# Patient Record
Sex: Male | Born: 1935 | Race: White | Hispanic: No | Smoking: Never smoker
Health system: Southern US, Community
[De-identification: ages and names within clinical notes are randomized; demographics above are authoritative.]

## PROBLEM LIST (undated history)

## (undated) DIAGNOSIS — M545 Low back pain, unspecified: Secondary | ICD-10-CM

## (undated) DIAGNOSIS — E875 Hyperkalemia: Secondary | ICD-10-CM

## (undated) DIAGNOSIS — F1020 Alcohol dependence, uncomplicated: Secondary | ICD-10-CM

## (undated) DIAGNOSIS — N183 Chronic kidney disease, stage 3 unspecified: Secondary | ICD-10-CM

## (undated) DIAGNOSIS — R569 Unspecified convulsions: Secondary | ICD-10-CM

## (undated) DIAGNOSIS — I10 Essential (primary) hypertension: Secondary | ICD-10-CM

## (undated) DIAGNOSIS — K635 Polyp of colon: Secondary | ICD-10-CM

## (undated) DIAGNOSIS — N400 Enlarged prostate without lower urinary tract symptoms: Secondary | ICD-10-CM

## (undated) DIAGNOSIS — D649 Anemia, unspecified: Secondary | ICD-10-CM

## (undated) DIAGNOSIS — M25559 Pain in unspecified hip: Secondary | ICD-10-CM

## (undated) DIAGNOSIS — E785 Hyperlipidemia, unspecified: Secondary | ICD-10-CM

## (undated) DIAGNOSIS — I2510 Atherosclerotic heart disease of native coronary artery without angina pectoris: Secondary | ICD-10-CM

## (undated) DIAGNOSIS — G8929 Other chronic pain: Secondary | ICD-10-CM

## (undated) DIAGNOSIS — D519 Vitamin B12 deficiency anemia, unspecified: Principal | ICD-10-CM

## (undated) DIAGNOSIS — H43812 Vitreous degeneration, left eye: Secondary | ICD-10-CM

## (undated) DIAGNOSIS — M898X9 Other specified disorders of bone, unspecified site: Principal | ICD-10-CM

## (undated) HISTORY — DX: Chronic kidney disease, stage 3 unspecified: N18.30

## (undated) HISTORY — DX: Vitreous degeneration, left eye: H43.812

## (undated) HISTORY — DX: Pain in unspecified hip: M25.559

## (undated) HISTORY — DX: Anemia, unspecified: D64.9

## (undated) HISTORY — DX: Essential (primary) hypertension: I10

## (undated) HISTORY — DX: Other chronic pain: G89.29

## (undated) HISTORY — DX: Benign prostatic hyperplasia without lower urinary tract symptoms: N40.0

## (undated) HISTORY — DX: Chronic kidney disease, stage 3 (moderate): N18.3

## (undated) HISTORY — DX: Low back pain: M54.5

## (undated) HISTORY — PX: OTHER SURGICAL HISTORY: SHX169

## (undated) HISTORY — DX: Unspecified convulsions: R56.9

## (undated) HISTORY — DX: Low back pain, unspecified: M54.50

## (undated) HISTORY — PX: LUMBAR DISC SURGERY: SHX700

## (undated) HISTORY — DX: Hyperkalemia: E87.5

## (undated) HISTORY — DX: Atherosclerotic heart disease of native coronary artery without angina pectoris: I25.10

## (undated) HISTORY — DX: Alcohol dependence, uncomplicated: F10.20

## (undated) HISTORY — DX: Vitamin B12 deficiency anemia, unspecified: D51.9

## (undated) HISTORY — DX: Other specified disorders of bone, unspecified site: M89.8X9

## (undated) HISTORY — DX: Polyp of colon: K63.5

## (undated) HISTORY — DX: Hyperlipidemia, unspecified: E78.5

---
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
## 1986-09-01 HISTORY — PX: OTHER SURGICAL HISTORY: SHX169

## 1999-09-02 DIAGNOSIS — I2510 Atherosclerotic heart disease of native coronary artery without angina pectoris: Secondary | ICD-10-CM

## 1999-09-02 HISTORY — DX: Atherosclerotic heart disease of native coronary artery without angina pectoris: I25.10

## 2001-10-22 ENCOUNTER — Encounter: Admission: RE | Admit: 2001-10-22 | Discharge: 2002-01-13 | Payer: Self-pay | Admitting: Family Medicine

## 2001-12-30 ENCOUNTER — Encounter: Admission: RE | Admit: 2001-12-30 | Discharge: 2001-12-30 | Payer: Self-pay | Admitting: Family Medicine

## 2001-12-30 ENCOUNTER — Encounter: Payer: Self-pay | Admitting: Family Medicine

## 2002-02-16 ENCOUNTER — Encounter: Payer: Self-pay | Admitting: *Deleted

## 2002-02-16 ENCOUNTER — Encounter: Admission: RE | Admit: 2002-02-16 | Discharge: 2002-02-16 | Payer: Self-pay | Admitting: *Deleted

## 2002-03-02 ENCOUNTER — Encounter: Admission: RE | Admit: 2002-03-02 | Discharge: 2002-03-02 | Payer: Self-pay | Admitting: *Deleted

## 2002-03-02 ENCOUNTER — Encounter: Payer: Self-pay | Admitting: *Deleted

## 2002-03-15 ENCOUNTER — Encounter: Admission: RE | Admit: 2002-03-15 | Discharge: 2002-03-15 | Payer: Self-pay | Admitting: *Deleted

## 2002-03-15 ENCOUNTER — Encounter: Payer: Self-pay | Admitting: *Deleted

## 2004-02-21 ENCOUNTER — Encounter: Admission: RE | Admit: 2004-02-21 | Discharge: 2004-02-21 | Payer: Self-pay | Admitting: Family Medicine

## 2004-09-01 DIAGNOSIS — R569 Unspecified convulsions: Secondary | ICD-10-CM

## 2004-09-01 HISTORY — DX: Unspecified convulsions: R56.9

## 2004-12-26 ENCOUNTER — Emergency Department (HOSPITAL_COMMUNITY): Admission: EM | Admit: 2004-12-26 | Discharge: 2004-12-26 | Payer: Self-pay | Admitting: Family Medicine

## 2004-12-28 ENCOUNTER — Ambulatory Visit (HOSPITAL_COMMUNITY): Admission: RE | Admit: 2004-12-28 | Discharge: 2004-12-28 | Payer: Self-pay | Admitting: Emergency Medicine

## 2005-01-12 ENCOUNTER — Emergency Department (HOSPITAL_COMMUNITY): Admission: EM | Admit: 2005-01-12 | Discharge: 2005-01-12 | Payer: Self-pay | Admitting: Emergency Medicine

## 2005-03-26 ENCOUNTER — Encounter: Payer: Self-pay | Admitting: Internal Medicine

## 2005-03-26 ENCOUNTER — Ambulatory Visit (HOSPITAL_COMMUNITY): Admission: RE | Admit: 2005-03-26 | Discharge: 2005-03-26 | Payer: Self-pay | Admitting: *Deleted

## 2005-03-26 ENCOUNTER — Encounter (INDEPENDENT_AMBULATORY_CARE_PROVIDER_SITE_OTHER): Payer: Self-pay | Admitting: Specialist

## 2005-04-30 ENCOUNTER — Encounter: Admission: RE | Admit: 2005-04-30 | Discharge: 2005-04-30 | Payer: Self-pay | Admitting: Nephrology

## 2005-05-02 ENCOUNTER — Encounter (HOSPITAL_COMMUNITY): Admission: RE | Admit: 2005-05-02 | Discharge: 2005-05-16 | Payer: Self-pay | Admitting: Nephrology

## 2005-05-30 ENCOUNTER — Encounter (HOSPITAL_COMMUNITY): Admission: RE | Admit: 2005-05-30 | Discharge: 2005-08-28 | Payer: Self-pay | Admitting: Nephrology

## 2005-07-29 ENCOUNTER — Inpatient Hospital Stay (HOSPITAL_COMMUNITY): Admission: EM | Admit: 2005-07-29 | Discharge: 2005-07-31 | Payer: Self-pay | Admitting: Emergency Medicine

## 2005-07-29 ENCOUNTER — Ambulatory Visit: Payer: Self-pay | Admitting: Internal Medicine

## 2005-08-06 ENCOUNTER — Ambulatory Visit (HOSPITAL_COMMUNITY): Admission: RE | Admit: 2005-08-06 | Discharge: 2005-08-06 | Payer: Self-pay | Admitting: Otolaryngology

## 2005-08-11 ENCOUNTER — Ambulatory Visit: Payer: Self-pay | Admitting: Internal Medicine

## 2005-08-19 ENCOUNTER — Ambulatory Visit: Payer: Self-pay | Admitting: Internal Medicine

## 2005-09-05 ENCOUNTER — Encounter (HOSPITAL_COMMUNITY): Admission: RE | Admit: 2005-09-05 | Discharge: 2005-12-04 | Payer: Self-pay | Admitting: Nephrology

## 2005-09-12 ENCOUNTER — Ambulatory Visit: Payer: Self-pay | Admitting: Hospitalist

## 2005-10-23 ENCOUNTER — Ambulatory Visit (HOSPITAL_COMMUNITY): Admission: RE | Admit: 2005-10-23 | Discharge: 2005-10-23 | Payer: Self-pay | Admitting: Neurology

## 2005-12-15 ENCOUNTER — Encounter (HOSPITAL_COMMUNITY): Admission: RE | Admit: 2005-12-15 | Discharge: 2006-03-15 | Payer: Self-pay | Admitting: Nephrology

## 2006-04-09 ENCOUNTER — Encounter (HOSPITAL_COMMUNITY): Admission: RE | Admit: 2006-04-09 | Discharge: 2006-07-08 | Payer: Self-pay | Admitting: Nephrology

## 2006-08-28 ENCOUNTER — Encounter (HOSPITAL_COMMUNITY): Admission: RE | Admit: 2006-08-28 | Discharge: 2006-11-26 | Payer: Self-pay | Admitting: Nephrology

## 2006-12-05 ENCOUNTER — Emergency Department (HOSPITAL_COMMUNITY): Admission: EM | Admit: 2006-12-05 | Discharge: 2006-12-05 | Payer: Self-pay | Admitting: Emergency Medicine

## 2006-12-26 ENCOUNTER — Emergency Department (HOSPITAL_COMMUNITY): Admission: EM | Admit: 2006-12-26 | Discharge: 2006-12-26 | Payer: Self-pay | Admitting: Emergency Medicine

## 2007-01-07 ENCOUNTER — Encounter (HOSPITAL_COMMUNITY): Admission: RE | Admit: 2007-01-07 | Discharge: 2007-04-07 | Payer: Self-pay | Admitting: Nephrology

## 2007-05-18 ENCOUNTER — Encounter (HOSPITAL_COMMUNITY): Admission: RE | Admit: 2007-05-18 | Discharge: 2007-08-16 | Payer: Self-pay | Admitting: Nephrology

## 2007-09-20 ENCOUNTER — Encounter (HOSPITAL_COMMUNITY): Admission: RE | Admit: 2007-09-20 | Discharge: 2007-12-19 | Payer: Self-pay | Admitting: Nephrology

## 2007-11-27 ENCOUNTER — Encounter: Payer: Self-pay | Admitting: Internal Medicine

## 2008-01-26 ENCOUNTER — Encounter (HOSPITAL_COMMUNITY): Admission: RE | Admit: 2008-01-26 | Discharge: 2008-04-25 | Payer: Self-pay | Admitting: Nephrology

## 2008-03-22 ENCOUNTER — Emergency Department (HOSPITAL_COMMUNITY): Admission: EM | Admit: 2008-03-22 | Discharge: 2008-03-22 | Payer: Self-pay | Admitting: Family Medicine

## 2008-05-30 ENCOUNTER — Encounter (HOSPITAL_COMMUNITY): Admission: RE | Admit: 2008-05-30 | Discharge: 2008-08-28 | Payer: Self-pay | Admitting: Nephrology

## 2008-06-05 ENCOUNTER — Encounter: Payer: Self-pay | Admitting: Internal Medicine

## 2008-07-13 ENCOUNTER — Emergency Department (HOSPITAL_COMMUNITY): Admission: EM | Admit: 2008-07-13 | Discharge: 2008-07-13 | Payer: Self-pay | Admitting: Emergency Medicine

## 2008-09-05 ENCOUNTER — Encounter: Payer: Self-pay | Admitting: Family Medicine

## 2008-09-06 ENCOUNTER — Encounter (HOSPITAL_COMMUNITY): Admission: RE | Admit: 2008-09-06 | Discharge: 2008-12-05 | Payer: Self-pay | Admitting: Nephrology

## 2009-01-03 ENCOUNTER — Encounter (HOSPITAL_COMMUNITY): Admission: RE | Admit: 2009-01-03 | Discharge: 2009-04-03 | Payer: Self-pay | Admitting: Nephrology

## 2009-04-03 ENCOUNTER — Encounter (HOSPITAL_COMMUNITY): Admission: RE | Admit: 2009-04-03 | Discharge: 2009-07-02 | Payer: Self-pay | Admitting: Nephrology

## 2009-06-24 ENCOUNTER — Emergency Department (HOSPITAL_COMMUNITY): Admission: EM | Admit: 2009-06-24 | Discharge: 2009-06-24 | Payer: Self-pay | Admitting: Emergency Medicine

## 2009-07-24 ENCOUNTER — Encounter (HOSPITAL_COMMUNITY): Admission: RE | Admit: 2009-07-24 | Discharge: 2009-10-22 | Payer: Self-pay | Admitting: Nephrology

## 2009-11-13 ENCOUNTER — Encounter (HOSPITAL_COMMUNITY): Admission: RE | Admit: 2009-11-13 | Discharge: 2010-02-11 | Payer: Self-pay | Admitting: Nephrology

## 2010-03-05 ENCOUNTER — Encounter (INDEPENDENT_AMBULATORY_CARE_PROVIDER_SITE_OTHER): Payer: Self-pay | Admitting: *Deleted

## 2010-03-05 ENCOUNTER — Encounter (HOSPITAL_COMMUNITY): Admission: RE | Admit: 2010-03-05 | Discharge: 2010-06-03 | Payer: Self-pay | Admitting: Nephrology

## 2010-05-15 ENCOUNTER — Encounter (INDEPENDENT_AMBULATORY_CARE_PROVIDER_SITE_OTHER): Payer: Self-pay | Admitting: *Deleted

## 2010-05-15 ENCOUNTER — Ambulatory Visit: Payer: Self-pay | Admitting: Internal Medicine

## 2010-05-15 DIAGNOSIS — Z860100 Personal history of colon polyps, unspecified: Secondary | ICD-10-CM | POA: Insufficient documentation

## 2010-05-15 DIAGNOSIS — I739 Peripheral vascular disease, unspecified: Secondary | ICD-10-CM | POA: Insufficient documentation

## 2010-05-20 ENCOUNTER — Encounter: Payer: Self-pay | Admitting: Internal Medicine

## 2010-05-21 ENCOUNTER — Encounter (INDEPENDENT_AMBULATORY_CARE_PROVIDER_SITE_OTHER): Payer: Self-pay | Admitting: *Deleted

## 2010-06-11 ENCOUNTER — Encounter (HOSPITAL_COMMUNITY)
Admission: RE | Admit: 2010-06-11 | Discharge: 2010-09-09 | Payer: Self-pay | Source: Home / Self Care | Attending: Nephrology | Admitting: Nephrology

## 2010-08-09 ENCOUNTER — Emergency Department (HOSPITAL_COMMUNITY)
Admission: EM | Admit: 2010-08-09 | Discharge: 2010-08-09 | Payer: Self-pay | Source: Home / Self Care | Admitting: Emergency Medicine

## 2010-08-12 ENCOUNTER — Emergency Department (HOSPITAL_COMMUNITY)
Admission: EM | Admit: 2010-08-12 | Discharge: 2010-08-12 | Payer: Self-pay | Source: Home / Self Care | Admitting: Emergency Medicine

## 2010-09-04 ENCOUNTER — Emergency Department (HOSPITAL_COMMUNITY)
Admission: EM | Admit: 2010-09-04 | Discharge: 2010-09-04 | Payer: Self-pay | Source: Home / Self Care | Admitting: Emergency Medicine

## 2010-10-01 NOTE — Letter (Signed)
Summary: [facility] Instructions  Larry West  [address] [place], [place] [address]   Phone: [phone]  Fax: [phone]       Larry West    1925/10/20    MRN: [id number]      Procedure Day Dorna Bloom: Jake Shark, 07/02/10     Arrival Time: 10:30 AM      Procedure Time: 11:30 AM    Location of Procedure:                    _X_  [facility] (4th Floor)                     PREPARATION FOR COLONOSCOPY WITH MOVIPREP   Starting 5 days prior to your procedure 06/27/10 do not eat nuts, seeds, popcorn, corn, beans, peas,  salads, or any raw vegetables.  Do not take any fiber supplements (e.g. Metamucil, Citrucel, and Benefiber).  THE DAY BEFORE YOUR PROCEDURE         MONDAY, 07/01/10  1.  Drink clear liquids the entire day-NO SOLID FOOD  2.  Do not drink anything colored red or purple.  Avoid juices with pulp.  No orange juice.  3.  Drink at least 64 oz. (8 glasses) of fluid/clear liquids during the day to prevent dehydration and help the prep work efficiently.  CLEAR LIQUIDS INCLUDE: Water Jello Ice Popsicles Tea (sugar ok, no milk/cream) Powdered fruit flavored drinks Coffee (sugar ok, no milk/cream) Gatorade Juice: apple, white grape, white cranberry  Lemonade Clear bullion, consomm, broth Carbonated beverages (any kind) Strained chicken noodle soup Hard Candy                          4.  In the morning, mix first dose of MoviPrep solution:    Empty 1 Pouch A and 1 Pouch B into the disposable container    Add lukewarm drinking water to the top line of the container. Mix to dissolve    Refrigerate (mixed solution should be used within 24 hrs)  5.  Begin drinking the prep at 5:00 p.m. The MoviPrep container is divided by 4 marks.   Every 15 minutes drink the solution down to the next mark (approximately 8 oz) until the full liter is complete.   6.  Follow completed prep with 16 oz of clear liquid of your choice (Nothing red or purple).   Continue to drink clear liquids until bedtime.  7.  Before going to bed, mix second dose of MoviPrep solution:    Empty 1 Pouch A and 1 Pouch B into the disposable container    Add lukewarm drinking water to the top line of the container. Mix to dissolve    Refrigerate  THE DAY OF YOUR PROCEDURE      TUESDAY, 07/02/10  Beginning at 6:30 AM (5 hours before procedure):         1. Every 15 minutes, drink the solution down to the next mark (approx 8 oz) until the full liter is complete.  2. Follow completed prep with 16 oz. of clear liquid of your choice.    3. You may drink clear liquids until 9:30 AM (2 HOURS BEFORE PROCEDURE).  MEDICATION INSTRUCTIONS  Unless otherwise instructed, you should take regular prescription medications with a small sip of water   as early as possible the morning of your procedure.  Stop taking Plavix or Aggrenox on  _  _  (7  days before procedure).  You will be contacted by our office prior to your procedure for directions on holding your Plavix.  If you do not hear from our office 1 week prior to your scheduled procedure, please call [phone] to discuss.         OTHER INSTRUCTIONS  You will need a responsible adult at least 75 years of age to accompany you and drive you home.   This person must remain in the waiting room during your procedure.  Wear loose fitting clothing that is easily removed.  Leave jewelry and other valuables at home.  However, you may wish to bring a book to read or  an iPod/MP3 player to listen to music as you wait for your procedure to start.  Remove all body piercing jewelry and leave at home.  Total time from sign-in until discharge is approximately 2-3 hours.  You should go home directly after your procedure and rest.  You can resume normal activities the  day after your procedure.  The day of your procedure you should not:   Drive   Make legal decisions   Operate machinery   Drink alcohol   Return to  work  You will receive specific instructions about eating, activities and medications before you leave.  The above instructions have been reviewed and explained to me by   _______________________  I fully understand and can verbalize these instructions _____________________________ Date _________

## 2010-10-01 NOTE — Procedures (Signed)
Summary: EGD: Barrett's, Hiatal Hernia: Dr. Luther Parody   EGD  Procedure date:  03/26/2005  Findings:      Findings: Barrett's Esophagus Findings: Hiatal Hernia Performed by Dr. Luther Parody  ***MICROSCOPIC EXAMINATION AND DIAGNOSIS***    1. SMALL BOWEL BIOPSY: BENIGN SMALL BOWEL MUCOSA. NO VILLOUS ATROPHY, INFLAMMATION OR OTHER ABNORMALITIES PRESENT.    2. ESOPHAGUS, BIOPSIES: FRAGMENTS OF BENIGN GASTRIC TYPE MUCOSA WITH CHRONIC INFLAMMATION. NO INTESTINAL METAPLASIA OR DYSPLASIA IDENTIFIED.  Patient Name: Larry West, Larry West MRN: [id number] Procedure Procedures: Panendoscopy (EGD) CPT: 43235.    with biopsy(s)/brushing(s). CPT: D1846139.  Personnel: Endoscopist: Roosvelt Harps, MD.  Referred By: Rudi Heap, MD.  Exam Location: Exam performed in Endoscopy Suite. Outpatient  Patient Consent: Procedure, Alternatives, Risks and Benefits discussed, consent obtained, from patient. Consent was obtained by the RN. Consent to be contacted was not given.  Indications  Evaluation of: Anemia,  with low ferritin. with low iron saturation.  Symptoms: Reflux symptoms  History  Pre-Exam Physical: Performed Mar 17, 2002  Cardio-pulmonary exam, HEENT exam, Abdominal exam, Extremity exam, Neurological exam, Mental status exam WNL.  Exam Exam Info: Maximum depth of insertion Duodenum, intended Duodenum. Patient position: on left side. Vocal cords not visualized. Gastric retroflexion performed. Images taken. ASA Classification: II. Tolerance: excellent.  Sedation Meds: Patient assessed and found to be appropriate for moderate (conscious) sedation. Sedation was managed by the Endoscopist. Cetacaine Spray 2 sprays given aerosolized. Demerol 40 mg. given IV. Versed 4 mg. given IV.  Monitoring: BP and pulse monitoring done. Oximetry used. Supplemental O2 given  Fluoroscopy: Fluoroscopy was not used.  Findings BARRETT'S ESOPHAGUS:  suspected. Proximal margin 32 cm from mouth,  Z Line 34 cm  from mouth, Length of Barrett's 2 cm. No inflammation present. Biopsy/Barrett's taken. ICD9: Barrett's: 530.2. Comment: Image # 4.  - HIATAL HERNIA: Diaphragm 40 cm from mouth. Z-line/GE Junction 34 cm from mouth. Irregular, Biopsy/Hiatal Hernia taken.  ICD9: Hernia, Hiatal: 553.3.Comments: Image #3.  - IMAGE TAKEN: in Antrum. Image #2 attached. Comments: Probable Billroth 1.  IMAGE TAKEN: in Duodenal 2nd Portion. Image #1 attached. Comments: Normal.   Assessment Abnormal examination, see findings above.  Diagnoses: 553.3: Hernia, Hiatal.  530.2: Barrett's.   Events  Unplanned Intervention: No unplanned interventions were required.  Unplanned Events: There were no complications. Plans Medication(s): Continue current medications.  Patient Education: Patient given standard instructions for: Hiatal Hernia. Reflux.  Disposition: After procedure patient sent to recovery. After recovery patient sent home.  Scheduling: Await pathology to schedule patient. Follow-up prn.   CC:   Rudi Heap, MD  This report was created from the original endoscopy report, which was reviewed and signed by the above listed endoscopist.

## 2010-10-01 NOTE — Letter (Signed)
Summary: Anticoagulation Modification Letter  [facility]  [address] [place], [place] [address]   Phone: [phone]  Fax: [phone]    May 15, 2010  Re:    Larry West DOB:    17-Jul-1926 MRN:  [id number]    Dear Dr. Tanya Nones:  We have scheduled the above patient for an endoscopic procedure with Dr. Leone Payor. Our records show that he is on anticoagulation therapy. Please advise as to how long the patient may come off their therapy of Plavix prior to the scheduled procedure on July 02, 2010.  Please fax the completed form to Francee Piccolo, CMA (AAMA)  at [phone].  Thank you for your help with this matter.  Sincerely,  Francee Piccolo CMA Duncan Dull)   Physician Recommendation:  Hold Plavix 7 days prior ________________  Other ______________________________     Appended Document: Anticoagulation Modification Letter Note received from Dr.Pickard OK'ing stopping Plavix prior to procedure.  Pt notified via phone, but I will also mail him a letter.

## 2010-10-01 NOTE — Procedures (Signed)
Summary: Colonoscopy: Adenoma x 2: Dr. Luther Parody   Colonoscopy  Procedure date:  03/26/2005  Findings:      Pathology:  Adenomatous polyp. Location:  [facility].   ***MICROSCOPIC EXAMINATION AND DIAGNOSIS***    1. SMALL BOWEL BIOPSY: BENIGN SMALL BOWEL MUCOSA. NO VILLOUS ATROPHY, INFLAMMATION OR OTHER ABNORMALITIES PRESENT.    2. ESOPHAGUS, BIOPSIES: FRAGMENTS OF BENIGN GASTRIC TYPE MUCOSA WITH CHRONIC INFLAMMATION. NO INTESTINAL METAPLASIA OR DYSPLASIA IDENTIFIED.    3. COLON, HEPATIC FLEXURE, POLYPS: ONE ADENOMATOUS POLYP AND POLYPOID FRAGMENTS OF BENIGN COLONIC MUCOSA. NO HIGH GRADE DYSPLASIA OR MALIGNANCY IDENTIFIED.    4. COLON, 30 CM, BIOPSIES: ONE ADENOMATOUS POLYP AND ONE FRAGMENT OF BENIGN COLONIC MUCOSA. NO HIGH GRADE DYSPLASIA OR MALIGNANCY IDENTIFIED.  Comments:      Repeat colonoscopy in 5 years.   Procedures Next Due Date:    Colonoscopy: 04/2010  Patient Name: Larry West, Larry West MRN: [id number] Procedure Procedures: Colonoscopy CPT: 19147.    with biopsy. CPT: Q5068410.  Personnel: Endoscopist: Roosvelt Harps, MD.  Referred By: Rudi Heap, MD.  Exam Location: Exam performed in Endoscopy Suite. Outpatient  Patient Consent: Procedure, Alternatives, Risks and Benefits discussed, consent obtained, from patient. Consent was obtained by the RN. Consent to be contacted was not given.  Indications  Evaluation of: Anemia with low ferritin. with low iron saturation.  History  Current Medications: Patient is not currently taking Coumadin.  Allergies: Allergic to PCN.  Patient Habits Patient does not smoke. Drinking Status: not currently drinking.  Pre-Exam Physical: Performed Mar 17, 2002. Cardio-pulmonary exam, Rectal exam, HEENT exam , Abdominal exam, Extremity exam, Neurological exam, Mental status exam WNL.  Exam Exam: Extent of exam reached: Cecum, extent intended: Cecum.  The cecum was identified by appendiceal orifice and IC valve.  Patient position: on left side. Colon retroflexion performed. Images taken. ASA Classification: II. Tolerance: excellent.  Monitoring: Pulse and BP monitoring, Oximetry used. Supplemental O2 given.  Colon Prep Used MIRALAX for colon prep. Prep results: good.  Fluoroscopy: Fluoroscopy was not used.  Sedation Meds: Patient assessed and found to be appropriate for moderate (conscious) sedation. Sedation was managed by the Endoscopist. Residual sedation present from prior procedure today.  Demerol 20 mg. given IV. Versed 1 mg. given IV.  Findings - MULTIPLE POLYPS: Hepatic Flexure. minimum size 4 mm, maximum size 4 mm. Procedure:  biopsy without cautery, removed, Polyp retrieved, 2 polyps Polyps sent to pathology. ICD9: Colon Polyps: 211.3. Comments: Image # 3.  IMAGE TAKEN: Ascending Colon.  Image #2 attached.  Comments:  Normal.  IMAGE TAKEN: Cecum.  Image #1 attached.  Comments:  Normal.  POLYP: Sigmoid Colon, diminutive, Distance from Anus 30 cm. Procedure:  biopsy without cautery, removed, retrieved, Polyp sent to pathology.  IMAGE TAKEN: Rectum.  Image #3 attached.  Comments:  Normal.   Assessment Abnormal examination, see findings above.  Diagnoses: 211.3: Colon Polyps.   Events  Unplanned Interventions: No intervention was required.  Unplanned Events: There were no complications. Plans  Post Exam Instructions: Post sedation instructions given.  Medication Plan: Continue current medications.  Patient Education: Patient given standard instructions for: Polyps.  Disposition: After procedure patient sent to recovery. After recovery patient sent home.  Scheduling/Referral: Follow-Up prn. Await pathology to schedule patient.

## 2010-10-01 NOTE — Letter (Signed)
Summary: Generic Letter  [facility]  [address] [place], [place] [address]   Phone: [phone]  Fax: [phone]    05/21/2010  Larry West [address] [place], [place]  [address]  Dear Mr. Baysinger,  As we discussed on the phone earlier today, it is OK for you to hold your Plavix prior to your colonoscopy on November 1st.  You will take your regular dose on June 25, 2010.  Do not take any Plavix from June 26, 2010 through your procedure date.  Dr. Leone Payor will give your instructions on restarting your Plavix at your procedure.  If you have any questions please call our office and ask to speak with [facility].  Sincerely,   Francee Piccolo CMA (AAMA)

## 2010-10-01 NOTE — Letter (Signed)
Summary: Colonoscopy-Changed to Office Visit Letter  [facility]  [address] [place], [place] [address]   Phone: [phone]  Fax: [phone]      March 05, 2010 MRN: [id number]   Larry West [address] [place], [place]  [address]   Dear [name],   According to our records, it is time for you to schedule a Colonoscopy. However, after reviewing your medical record, I feel that an office visit would be most appropriate to more completely evaluate you and determine your need for a repeat procedure.  Please call [phone] (option #2) at your convenience to schedule an office visit. If you have any questions, concerns, or feel that this letter is in error, we would appreciate your call.   Sincerely,   Iva Boop, M.D.  [facility] Gastroenterology Division [phone]

## 2010-10-01 NOTE — Assessment & Plan Note (Signed)
Summary: f/u--ch.   History of Present Illness Visit Type: Initial Visit Primary GI MD: Stan Head MD [facility] Primary Provider: Rudi Heap, MD Chief Complaint: Colon recall History of Present Illness:   77 yo wm with prior polyps  Son recently died from colon cancer at age 50.   GI Review of Systems    Reports acid reflux and  weight loss.      Denies abdominal pain, belching, bloating, chest pain, dysphagia with liquids, dysphagia with solids, heartburn, loss of appetite, nausea, vomiting, vomiting blood, and  weight gain.        Denies anal fissure, black tarry stools, change in bowel habit, constipation, diarrhea, diverticulosis, fecal incontinence, heme positive stool, hemorrhoids, irritable bowel syndrome, jaundice, light color stool, liver problems, rectal bleeding, and  rectal pain. Preventive Screening-Counseling & Management  Alcohol-Tobacco     Smoking Status: never      Drug Use:  no.      Clinical Reports Reviewed:  Colonoscopy:  03/26/2005:  Pathology:  Adenomatous polyp. Location:  [facility].   ***MICROSCOPIC EXAMINATION AND DIAGNOSIS***    1. SMALL BOWEL BIOPSY: BENIGN SMALL BOWEL MUCOSA. NO VILLOUS ATROPHY, INFLAMMATION OR OTHER ABNORMALITIES PRESENT.    2. ESOPHAGUS, BIOPSIES: FRAGMENTS OF BENIGN GASTRIC TYPE MUCOSA WITH CHRONIC INFLAMMATION. NO INTESTINAL METAPLASIA OR DYSPLASIA IDENTIFIED.    3. COLON, HEPATIC FLEXURE, POLYPS: ONE ADENOMATOUS POLYP AND POLYPOID FRAGMENTS OF BENIGN COLONIC MUCOSA. NO HIGH GRADE DYSPLASIA OR MALIGNANCY IDENTIFIED.    4. COLON, 30 CM, BIOPSIES: ONE ADENOMATOUS POLYP AND ONE FRAGMENT OF BENIGN COLONIC MUCOSA. NO HIGH GRADE DYSPLASIA OR MALIGNANCY IDENTIFIED.   Current Medications (verified): 1)  Amlodipine Besylate 10 Mg Tabs (Amlodipine Besylate) .... Once Daily 2)  Benazepril Hcl 20 Mg Tabs (Benazepril Hcl) .... Once Daily 3)  Calcitriol 0.25 Mcg Caps (Calcitriol) .... Once Daily 4)  Furosemide 20 Mg Tabs  (Furosemide) .... Once Daily 5)  Hydrocodone-Acetaminophen 7.5-500 Mg Tabs (Hydrocodone-Acetaminophen) .... As Needed 6)  Lipitor 40 Mg Tabs (Atorvastatin Calcium) .... Once Daily 7)  Metoprolol Tartrate 25 Mg Tabs (Metoprolol Tartrate) .... Once Daily 8)  Nevanac 0.1 % Susp (Nepafenac) .... As Needed 9)  Plavix 75 Mg Tabs (Clopidogrel Bisulfate) .... Once Daily 10)  Phenytoin Sodium Extended 100 Mg Caps (Phenytoin Sodium Extended) .... Once Daily  Allergies: 1)  ! Sulfa 2)  Pcn  Past History:  Past Medical History: Spinal Stenosis w/DJD Peripheral Vascular Disease Chronic Kidney Disease Adenomatous Colon Polyps Hypertension Myocardial Infarction Seizures  Past Surgical History: Reviewed history from 05/15/2010 and no changes required. Stomach Surgery Appendectomy Back Surgery '96 PTCA-Stent  Family History: No FH of Colon Cancer:  Social History: Occupation: Retired Patient has never smoked.  Alcohol Use - yes Illicit Drug Use - no Smoking Status:  never Drug Use:  no  Review of Systems       The patient complains of back pain and change in vision.         All other ROS negative except as per HPI. cataract removed and vision imroved  Vital Signs:  Patient profile:   77 year old male Height:      59 inches Weight:      174.13 pounds BMI:     35.30 Pulse rate:   60 / minute Pulse rhythm:   regular BP sitting:   168 / 86  (left arm) Cuff size:   regular  Vitals Entered By: June McMurray CMA Duncan Dull) (May 15, 2010 2:03 PM)  Physical Exam  General:  elderly NAD Eyes:  no icterus. Mouth:  poor dentition Lungs:  diffusely decreased breath sounds Heart:  split S2 otherwise normal without rubs, gallops murmurs Abdomen:  Soft, nontender and nondistended. No masses, hepatosplenomegaly or hernias noted. Normal bowel sounds. Neurologic:  Alert and  oriented x3   Impression & Recommendations:  Problem # 1:  PERSONAL HX COLONIC POLYPS  (ICD-V12.72) Assessment Unchanged 5 years since adenomas reomved so surveillance and screening exam appropriate. Orders: Colonoscopy (Colon) Risks, benefits,and indications of endoscopic procedure(s) were reviewed with the patient and all questions answered.  Problem # 2:  SCREENING COLORECTAL-CANCER (ICD-V76.51) Assessment: Unchanged  Orders: Colonoscopy (Colon)  Problem # 3:  FAMILY HX COLON CANCER (ICD-V16.0) Assessment: New son at 61  Problem # 4:  PERIPHERAL VASCULAR DISEASE (ICD-443.9) Assessment: New takes Plavix for this will ask Dr. Tanya Nones if ok to hold patient understands problems with PVD possible off Plavix  Patient Instructions: 1)  Please pick up your medications at your pharmacy. MOVIPREP 2)  We will see you at your procedure on 07/02/10. 3)  We will contact Dr. Tanya Nones regarding your Plavix.  You will be contacted by our office prior to your procedure for directions on holding your Plavix.  If you do not hear from our office 1 week prior to your scheduled procedure, please call [phone] to discuss.  4)  Copy sent to : Rudi Heap, MD [facility]) 5)  [facility] Patient Information Guide given to patient.  6)  Colonoscopy and Flexible Sigmoidoscopy brochure given.  7)  The medication list was reviewed and reconciled.  All changed / newly prescribed medications were explained.  A complete medication list was provided to the patient / caregiver. Prescriptions: MOVIPREP 100 GM  SOLR (PEG-KCL-NACL-NASULF-NA ASC-C) As per prep instructions.  #1 x 0   Entered by:   Francee Piccolo CMA (AAMA)   Authorized by:   Iva Boop MD, [facility]   Signed by:   Francee Piccolo CMA (AAMA) on 05/15/2010   Method used:   Electronically to        [facility] [phone]* (retail)       [address]       [place], [place]  [address]       Ph: [phone]       Fax: [phone]   RxID:   [id number]

## 2010-10-01 NOTE — Consult Note (Signed)
Summary: [place] kidney:Dr. West [facility] kidney:Dr. Coladonato   Imported By: Florinda Marker 01/03/2008 14:33:21  _____________________________________________________________________  External Attachment:    Type:   Image     Comment:   External Document

## 2010-10-01 NOTE — Letter (Signed)
Summary: Anticoag Clearance  Anticoag Clearance   Imported By: Francee Piccolo CMA (AAMA) 05/22/2010 15:42:43  _____________________________________________________________________  External Attachment:    Type:   Image     Comment:   External Document

## 2010-10-02 ENCOUNTER — Encounter (HOSPITAL_COMMUNITY)
Admission: RE | Admit: 2010-10-02 | Discharge: 2010-10-11 | Disposition: A | Discharge status: Home / Self Care | Payer: Medicare FFS | Source: Ambulatory Visit | Attending: Nephrology | Admitting: Nephrology

## 2010-10-02 DIAGNOSIS — N289 Disorder of kidney and ureter, unspecified: Secondary | ICD-10-CM | POA: Insufficient documentation

## 2010-10-15 ENCOUNTER — Encounter (HOSPITAL_COMMUNITY): Payer: Medicare FFS

## 2010-10-15 ENCOUNTER — Other Ambulatory Visit: Payer: Self-pay

## 2010-10-29 ENCOUNTER — Encounter (HOSPITAL_COMMUNITY): Payer: Medicare FFS

## 2010-10-29 ENCOUNTER — Other Ambulatory Visit: Payer: Self-pay

## 2010-11-11 LAB — POCT HEMOGLOBIN-HEMACUE: Hemoglobin: 10.7 g/dL — ABNORMAL LOW (ref 13.0–17.0)

## 2010-11-12 ENCOUNTER — Encounter (HOSPITAL_COMMUNITY): Payer: Medicare FFS | Attending: Nephrology

## 2010-11-12 ENCOUNTER — Other Ambulatory Visit: Payer: Self-pay | Admitting: Nephrology

## 2010-11-12 ENCOUNTER — Other Ambulatory Visit: Payer: Self-pay

## 2010-11-12 DIAGNOSIS — N183 Chronic kidney disease, stage 3 unspecified: Secondary | ICD-10-CM | POA: Insufficient documentation

## 2010-11-12 DIAGNOSIS — D638 Anemia in other chronic diseases classified elsewhere: Secondary | ICD-10-CM | POA: Insufficient documentation

## 2010-11-12 LAB — POCT HEMOGLOBIN-HEMACUE
Hemoglobin: 10.3 g/dL — ABNORMAL LOW (ref 13.0–17.0)
Hemoglobin: 9.6 g/dL — ABNORMAL LOW (ref 13.0–17.0)

## 2010-11-12 LAB — IRON AND TIBC
Saturation Ratios: 32 % (ref 20–55)
TIBC: 280 ug/dL (ref 215–435)
UIBC: 190 ug/dL

## 2010-11-12 LAB — RENAL FUNCTION PANEL
Albumin: 3 g/dL — ABNORMAL LOW (ref 3.5–5.2)
BUN: 26 mg/dL — ABNORMAL HIGH (ref 6–23)
CO2: 19 mEq/L (ref 19–32)
Chloride: 109 mEq/L (ref 96–112)
Creatinine, Ser: 1.71 mg/dL — ABNORMAL HIGH (ref 0.4–1.5)
Potassium: 4.4 mEq/L (ref 3.5–5.1)

## 2010-11-12 LAB — FERRITIN: Ferritin: 115 ng/mL (ref 22–322)

## 2010-11-13 LAB — URINALYSIS, ROUTINE W REFLEX MICROSCOPIC
Bilirubin Urine: NEGATIVE
Glucose, UA: NEGATIVE mg/dL
Protein, ur: 100 mg/dL — AB
Urobilinogen, UA: 0.2 mg/dL (ref 0.0–1.0)

## 2010-11-13 LAB — URINE MICROSCOPIC-ADD ON

## 2010-11-13 LAB — PTH, INTACT AND CALCIUM
Calcium, Total (PTH): 9.2 mg/dL (ref 8.4–10.5)
PTH: 12.6 pg/mL — ABNORMAL LOW (ref 14.0–72.0)

## 2010-11-13 LAB — RENAL FUNCTION PANEL
Albumin: 3.3 g/dL — ABNORMAL LOW (ref 3.5–5.2)
BUN: 36 mg/dL — ABNORMAL HIGH (ref 6–23)
Creatinine, Ser: 1.74 mg/dL — ABNORMAL HIGH (ref 0.4–1.5)
Glucose, Bld: 100 mg/dL — ABNORMAL HIGH (ref 70–99)
Phosphorus: 3.8 mg/dL (ref 2.3–4.6)

## 2010-11-13 LAB — POCT HEMOGLOBIN-HEMACUE: Hemoglobin: 10.2 g/dL — ABNORMAL LOW (ref 13.0–17.0)

## 2010-11-14 LAB — IRON AND TIBC
Iron: 215 ug/dL — ABNORMAL HIGH (ref 42–135)
Saturation Ratios: 77 % — ABNORMAL HIGH (ref 20–55)
UIBC: 65 ug/dL

## 2010-11-14 LAB — POCT HEMOGLOBIN-HEMACUE: Hemoglobin: 9.8 g/dL — ABNORMAL LOW (ref 13.0–17.0)

## 2010-11-15 LAB — IRON AND TIBC
Iron: 121 ug/dL (ref 42–135)
Saturation Ratios: 46 % (ref 20–55)
TIBC: 265 ug/dL (ref 215–435)

## 2010-11-15 LAB — POCT HEMOGLOBIN-HEMACUE: Hemoglobin: 8.7 g/dL — ABNORMAL LOW (ref 13.0–17.0)

## 2010-11-17 LAB — POCT HEMOGLOBIN-HEMACUE: Hemoglobin: 11 g/dL — ABNORMAL LOW (ref 13.0–17.0)

## 2010-11-17 LAB — FERRITIN: Ferritin: 191 ng/mL (ref 22–322)

## 2010-11-17 LAB — IRON AND TIBC
Saturation Ratios: 61 % — ABNORMAL HIGH (ref 20–55)
TIBC: 269 ug/dL (ref 215–435)

## 2010-11-18 LAB — IRON AND TIBC: Iron: 159 ug/dL — ABNORMAL HIGH (ref 42–135)

## 2010-11-19 LAB — POCT HEMOGLOBIN-HEMACUE: Hemoglobin: 10.7 g/dL — ABNORMAL LOW (ref 13.0–17.0)

## 2010-11-19 LAB — IRON AND TIBC
Iron: 96 ug/dL (ref 42–135)
Saturation Ratios: 37 % (ref 20–55)
TIBC: 263 ug/dL (ref 215–435)

## 2010-11-20 LAB — POCT HEMOGLOBIN-HEMACUE: Hemoglobin: 9.9 g/dL — ABNORMAL LOW (ref 13.0–17.0)

## 2010-11-20 LAB — IRON AND TIBC
Iron: 79 ug/dL (ref 42–135)
Iron: 128 ug/dL (ref 42–135)
Saturation Ratios: 43 % (ref 20–55)
TIBC: 301 ug/dL (ref 215–435)

## 2010-11-20 LAB — FERRITIN: Ferritin: 116 ng/mL (ref 22–322)

## 2010-11-24 LAB — POCT HEMOGLOBIN-HEMACUE: Hemoglobin: 10.2 g/dL — ABNORMAL LOW (ref 13.0–17.0)

## 2010-11-24 LAB — IRON AND TIBC: UIBC: 198 ug/dL

## 2010-11-24 LAB — FERRITIN: Ferritin: 111 ng/mL (ref 22–322)

## 2010-11-26 ENCOUNTER — Encounter (HOSPITAL_COMMUNITY): Payer: Medicare Other

## 2010-11-26 ENCOUNTER — Encounter (HOSPITAL_BASED_OUTPATIENT_CLINIC_OR_DEPARTMENT_OTHER)
Admission: RE | Admit: 2010-11-26 | Discharge: 2010-11-26 | Disposition: A | Discharge status: Home / Self Care | Payer: Medicare FFS | Source: Ambulatory Visit | Attending: Orthopedic Surgery | Admitting: Orthopedic Surgery

## 2010-11-26 LAB — BASIC METABOLIC PANEL
Calcium: 8.3 mg/dL — ABNORMAL LOW (ref 8.4–10.5)
Creatinine, Ser: 1.86 mg/dL — ABNORMAL HIGH (ref 0.4–1.5)
GFR calc Af Amer: 42 mL/min — ABNORMAL LOW (ref >60)
GFR calc non Af Amer: 35 mL/min — ABNORMAL LOW (ref >60)
Sodium: 138 mEq/L (ref 135–145)

## 2010-11-26 LAB — POCT HEMOGLOBIN-HEMACUE: Hemoglobin: 10.4 g/dL — ABNORMAL LOW (ref 13.0–17.0)

## 2010-11-26 LAB — RENAL FUNCTION PANEL
BUN: 41 mg/dL — ABNORMAL HIGH (ref 6–23)
CO2: 18 mEq/L — ABNORMAL LOW (ref 19–32)
Calcium: 8.6 mg/dL (ref 8.4–10.5)
Chloride: 108 mEq/L (ref 96–112)
GFR calc non Af Amer: 37 mL/min — ABNORMAL LOW (ref >60)
Glucose, Bld: 81 mg/dL (ref 70–99)
Phosphorus: 4.7 mg/dL — ABNORMAL HIGH (ref 2.3–4.6)
Sodium: 137 mEq/L (ref 135–145)

## 2010-11-26 LAB — IRON AND TIBC
Iron: 129 ug/dL (ref 42–135)
Saturation Ratios: 52 % (ref 20–55)
TIBC: 247 ug/dL (ref 215–435)
UIBC: 118 ug/dL

## 2010-11-27 ENCOUNTER — Ambulatory Visit (HOSPITAL_BASED_OUTPATIENT_CLINIC_OR_DEPARTMENT_OTHER)
Admission: RE | Admit: 2010-11-27 | Discharge: 2010-11-27 | Disposition: A | Discharge status: Home / Self Care | Payer: Medicare FFS | Source: Ambulatory Visit | Attending: Orthopedic Surgery | Admitting: Orthopedic Surgery

## 2010-11-27 DIAGNOSIS — Z01810 Encounter for preprocedural cardiovascular examination: Secondary | ICD-10-CM | POA: Insufficient documentation

## 2010-11-27 DIAGNOSIS — G5600 Carpal tunnel syndrome, unspecified upper limb: Secondary | ICD-10-CM | POA: Insufficient documentation

## 2010-12-02 LAB — IRON AND TIBC: Iron: 95 ug/dL (ref 42–135)

## 2010-12-02 LAB — FERRITIN: Ferritin: 109 ng/mL (ref 22–322)

## 2010-12-02 LAB — POCT HEMOGLOBIN-HEMACUE: Hemoglobin: 10.3 g/dL — ABNORMAL LOW (ref 13.0–17.0)

## 2010-12-04 LAB — IRON AND TIBC
Saturation Ratios: 33 % (ref 20–55)
TIBC: 279 ug/dL (ref 215–435)
UIBC: 188 ug/dL

## 2010-12-04 LAB — POCT HEMOGLOBIN-HEMACUE: Hemoglobin: 10.5 g/dL — ABNORMAL LOW (ref 13.0–17.0)

## 2010-12-05 LAB — IRON AND TIBC: Iron: 80 ug/dL (ref 42–135)

## 2010-12-05 LAB — FERRITIN: Ferritin: 74 ng/mL (ref 22–322)

## 2010-12-06 LAB — POCT HEMOGLOBIN-HEMACUE: Hemoglobin: 9.4 g/dL — ABNORMAL LOW (ref 13.0–17.0)

## 2010-12-06 LAB — IRON AND TIBC
Iron: 91 ug/dL (ref 42–135)
Saturation Ratios: 36 % (ref 20–55)
TIBC: 253 ug/dL (ref 215–435)

## 2010-12-07 LAB — IRON AND TIBC
Saturation Ratios: 51 % (ref 20–55)
UIBC: 144 ug/dL

## 2010-12-07 LAB — POCT HEMOGLOBIN-HEMACUE
Hemoglobin: 11.5 g/dL — ABNORMAL LOW (ref 13.0–17.0)
Hemoglobin: 14 g/dL (ref 13.0–17.0)

## 2010-12-08 LAB — POCT HEMOGLOBIN-HEMACUE: Hemoglobin: 10.4 g/dL — ABNORMAL LOW (ref 13.0–17.0)

## 2010-12-08 LAB — IRON AND TIBC
Iron: 91 ug/dL (ref 42–135)
TIBC: 254 ug/dL (ref 215–435)

## 2010-12-09 LAB — IRON AND TIBC
Saturation Ratios: 45 % (ref 20–55)
UIBC: 158 ug/dL

## 2010-12-09 LAB — POCT HEMOGLOBIN-HEMACUE: Hemoglobin: 11.5 g/dL — ABNORMAL LOW (ref 13.0–17.0)

## 2010-12-09 LAB — FERRITIN: Ferritin: 81 ng/mL (ref 22–322)

## 2010-12-10 ENCOUNTER — Encounter (HOSPITAL_COMMUNITY): Payer: Medicare Other

## 2010-12-10 LAB — IRON AND TIBC
Iron: 78 ug/dL (ref 42–135)
TIBC: 236 ug/dL (ref 215–435)

## 2010-12-10 LAB — FERRITIN: Ferritin: 86 ng/mL (ref 22–322)

## 2010-12-11 LAB — IRON AND TIBC
Saturation Ratios: 42 % (ref 20–55)
TIBC: 301 ug/dL (ref 215–435)
UIBC: 175 ug/dL

## 2010-12-11 LAB — POCT HEMOGLOBIN-HEMACUE: Hemoglobin: 12 g/dL — ABNORMAL LOW (ref 13.0–17.0)

## 2010-12-11 LAB — FERRITIN: Ferritin: 46 ng/mL (ref 22–322)

## 2010-12-12 LAB — IRON AND TIBC: UIBC: 193 ug/dL

## 2010-12-16 LAB — IRON AND TIBC
Iron: 93 ug/dL (ref 42–135)
Saturation Ratios: 28 % (ref 20–55)
TIBC: 335 ug/dL (ref 215–435)
UIBC: 242 ug/dL

## 2010-12-16 LAB — FERRITIN: Ferritin: 23 ng/mL (ref 22–322)

## 2010-12-16 LAB — POCT HEMOGLOBIN-HEMACUE: Hemoglobin: 11.6 g/dL — ABNORMAL LOW (ref 13.0–17.0)

## 2010-12-17 LAB — IRON AND TIBC: Saturation Ratios: 52 % (ref 20–55)

## 2010-12-17 LAB — FERRITIN: Ferritin: 44 ng/mL (ref 22–322)

## 2011-01-17 NOTE — Consult Note (Signed)
[name], [name]                ACCOUNT NO.:  [id number]   MEDICAL RECORD NO.:  [id number]          PATIENT TYPE:  INP   LOCATION:  2901                         FACILITY:  [facility]   PHYSICIAN:  Kristine Garbe. Ezzard Standing, M.D.DATE OF BIRTH:  Sep 20, 1925   DATE OF CONSULTATION:  07/29/2005  DATE OF DISCHARGE:                                   CONSULTATION   REASON FOR CONSULTATION:  Evaluate patient with left orbital blowout  fracture.   BRIEF HISTORY:  Larry West is a 77 year old gentleman with history of  seizure disorder. Apparently he fell last night and found this morning by  family members and brought to the emergency room. CT scan in the emergency  room showed a large left orbital blowout fracture.   PHYSICAL EXAMINATION:  GENERAL: On examination the patient is alert and  oriented.  HEENT: His pupils are small, but equal, round, and reactive to light. He had  vision intact in the left eye.  He had double vision with some limited  mobility of the eye. He had subconjunctival hemorrhage and edema. The  orbital rim was intact to palpation. He had normal facial nerve function.  TMs were clear bilaterally.   Review of the CT scan shows a large left orbital blowout fracture.   IMPRESSION:  1.  Seizure disorder. Questionable seizure last night.  2.  Large left orbital blowout fracture with double vision.  3.  Patient has a history of cataract surgery in his right eye with      decreased vision since cataract surgery.   RECOMMENDATIONS:  The patient will need to be evaluated by ophthalmology  prior to repair of the blowout fracture. I have contacted Dr. Delight Ovens  office concerning consult to evaluate left orbit.  Consider repair of left  orbital blowout fracture in the next one to two weeks.           ______________________________  Kristine Garbe Ezzard Standing, M.D.     CEN/MEDQ  D:  07/29/2005  T:  07/29/2005  Job:  [id number]

## 2011-01-17 NOTE — Op Note (Signed)
NAMEBRIANT, ANGELILLO                ACCOUNT NO.:  [id number]   MEDICAL RECORD NO.:  [id number]          PATIENT TYPE:  AMB   LOCATION:  SDS                          FACILITY:  [facility]   PHYSICIAN:  Kristine Garbe. Ezzard Standing, M.D.DATE OF BIRTH:  [birth year]   DATE OF PROCEDURE:  08/06/2005  DATE OF DISCHARGE:                                 OPERATIVE REPORT   PREOPERATIVE DIAGNOSIS:  Left orbital blow-out fracture.   POSTOPERATIVE DIAGNOSIS:  Left orbital blow-out fracture.   OPERATION:  Repair of left orbital blow-out fracture via infraciliary  incision with inion.   SURGEON:  Kristine Garbe. Ezzard Standing, M.D.   ANESTHESIA:  General endotracheal.   COMPLICATIONS:  None.   BRIEF CLINICAL NOTE:  Larry West is a 77 year old gentleman who suffered a  left orbital blow-out fracture when he had a seizure and fell about a week  ago.  CT scan showed a large blow-out fracture of the left orbital floor.  He had enophthalmos but no significant diplopia.  He is taken to the  operating room at this time for repair of left orbital blow-out fracture.   DESCRIPTION OF PROCEDURE:  After adequate endotracheal anesthesia, the face  and eyes were prepped with Betadine solution and draped out with sterile  towels.  An infraciliary incision was made on the left lower eyelid.  Dissection was carried down to the orbital rim.  The orbicularis muscles  were divided and the edge of the orbital rim was incised with a scalpel to  elevate the periosteum.  The periosteum was then elevated back posteriorly  over the infraorbital rim down along the floor of the orbit.  The patient  had a large orbital blow-out fracture starting about a centimeter from the  orbital rim, extended all the way to the medial orbital wall and then  laterally to the region of the infraorbital nerve.  There was a large amount  of fat protruding down into the maxillary sinus, which was elevated back up  into the orbit region.  Elevation was  carried down back to the posterior  portion of the fracture.  Malleable retractors were used to reduce the  periorbital fat back to the orbit out of the blow-out fracture.  After  adequate reduction of the fat and visualization of the edges of the  fracture, a piece of inion was cut to appropriate size.  After heating, it  was then placed along the floor of the orbit, covering the entire blow-out  fracture and supporting the orbital fat.  Hemostasis was obtained with the  cautery.  The patient had minimal bleeding.  The incision was closed with  running 6-0 nylon suture.  Antibiotic ointment was placed over the incision.  The patient was awoken from anesthesia and transferred to the recovery room  postop doing well.   DISPOSITION:  Lonzie is discharged home later this morning on Tylenol and  Tylenol No. 3 p.r.n. pain, Keflex 5 mg b.i.d. for one week.  Will have him  follow up in my office in six days to have his sutures removed.  ______________________________  Kristine Garbe Ezzard Standing, M.D.     CEN/MEDQ  D:  08/06/2005  T:  08/07/2005  Job:  [id number]
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
## 2011-02-12 ENCOUNTER — Other Ambulatory Visit: Payer: Self-pay | Admitting: Nephrology

## 2011-02-12 ENCOUNTER — Encounter (HOSPITAL_COMMUNITY)
Admission: RE | Admit: 2011-02-12 | Discharge: 2011-02-12 | Disposition: A | Discharge status: Home / Self Care | Payer: Medicare FFS | Source: Ambulatory Visit | Attending: Nephrology | Admitting: Nephrology

## 2011-02-12 DIAGNOSIS — D638 Anemia in other chronic diseases classified elsewhere: Secondary | ICD-10-CM | POA: Insufficient documentation

## 2011-02-12 DIAGNOSIS — N183 Chronic kidney disease, stage 3 unspecified: Secondary | ICD-10-CM | POA: Insufficient documentation

## 2011-02-12 LAB — RENAL FUNCTION PANEL
CO2: 20 mEq/L (ref 19–32)
Chloride: 107 mEq/L (ref 96–112)
GFR calc Af Amer: 41 mL/min — ABNORMAL LOW (ref >60)
Glucose, Bld: 102 mg/dL — ABNORMAL HIGH (ref 70–99)
Phosphorus: 4.7 mg/dL — ABNORMAL HIGH (ref 2.3–4.6)
Potassium: 4.7 mEq/L (ref 3.5–5.1)
Sodium: 139 mEq/L (ref 135–145)

## 2011-02-12 LAB — IRON AND TIBC
Iron: 94 ug/dL (ref 42–135)
TIBC: 325 ug/dL (ref 215–435)
UIBC: 231 ug/dL

## 2011-02-19 ENCOUNTER — Encounter: Payer: Self-pay | Admitting: Family Medicine

## 2011-02-19 DIAGNOSIS — R569 Unspecified convulsions: Secondary | ICD-10-CM

## 2011-02-19 DIAGNOSIS — M545 Low back pain: Secondary | ICD-10-CM | POA: Insufficient documentation

## 2011-02-19 DIAGNOSIS — F1020 Alcohol dependence, uncomplicated: Secondary | ICD-10-CM | POA: Insufficient documentation

## 2011-02-19 DIAGNOSIS — I10 Essential (primary) hypertension: Secondary | ICD-10-CM | POA: Insufficient documentation

## 2011-03-13 ENCOUNTER — Other Ambulatory Visit: Payer: Self-pay | Admitting: Nephrology

## 2011-03-13 ENCOUNTER — Encounter (HOSPITAL_COMMUNITY): Payer: Medicare FFS | Attending: Nephrology

## 2011-03-13 DIAGNOSIS — N183 Chronic kidney disease, stage 3 unspecified: Secondary | ICD-10-CM | POA: Insufficient documentation

## 2011-03-13 DIAGNOSIS — D638 Anemia in other chronic diseases classified elsewhere: Secondary | ICD-10-CM | POA: Insufficient documentation

## 2011-03-13 LAB — RENAL FUNCTION PANEL
BUN: 43 mg/dL — ABNORMAL HIGH (ref 6–23)
Glucose, Bld: 97 mg/dL (ref 70–99)
Phosphorus: 4.7 mg/dL — ABNORMAL HIGH (ref 2.3–4.6)
Potassium: 4.9 mEq/L (ref 3.5–5.1)

## 2011-03-14 LAB — POCT HEMOGLOBIN-HEMACUE: Hemoglobin: 9.1 g/dL — ABNORMAL LOW (ref 13.0–17.0)

## 2011-03-19 ENCOUNTER — Encounter: Payer: Self-pay | Admitting: Family Medicine

## 2011-03-21 NOTE — Op Note (Signed)
  NAMEHA, PLACERES                ACCOUNT NO.:  [id number]  MEDICAL RECORD NO.:  [id number]           PATIENT TYPE:  LOCATION:                                 FACILITY:  PHYSICIAN:  Cindee Salt, M.D.       DATE OF BIRTH:  12-Sep-1925  DATE OF PROCEDURE:  11/27/2010 DATE OF DISCHARGE:                              OPERATIVE REPORT   PREOPERATIVE DIAGNOSIS:  Carpal tunnel syndrome, right hand.  POSTOPERATIVE DIAGNOSIS:  Carpal tunnel syndrome, right hand.  OPERATION:  Decompression, right median nerve.  SURGEON:  Cindee Salt, MD  ASSISTANT:  None.  ANESTHESIA:  Forearm based IV regional with local infiltration.  ANESTHESIOLOGIST:  Janetta Hora. Frederick, MD  HISTORY:  The patient is an 77 year old male with a history of carpal tunnel syndrome, EMG nerve conductions positive which has not responded to conservative treatment.  He has elected to undergo surgical decompression.  Pre, peri, and postoperative course have been discussed along with risks and complications.  He is aware that there is no guarantee with surgery, possibility of infection, recurrence, injury to arteries, nerves, tendons, incomplete relief of symptoms, dystrophy.  In the preoperative area, the patient is seen, the extremity marked by both the patient and surgeon, and antibiotic given.  PROCEDURE IN DETAIL:  The patient was brought to the operating room where a forearm based IV regional anesthetic was carried out without difficulty.  He was prepped using ChloraPrep, supine position with the right arm free.  A longitudinal incision was made in the palm and carried down through the subcutaneous tissue.  Bleeders were electrocauterized.  Palmar fascia was split.  Superficial palmar arch identified.  The flexor tendon to the ring little finger identified.  To the ulnar side of the median nerve the carpal retinaculum was incised with sharp dissection.  Right angle and Sewall retractor were placed between skin and  forearm fascia.  The fascia was released for approximately a centimeter-and-half proximal to the wrist crease under direct vision.  The canal was explored.  The area of compression to the nerve was apparent.  No further lesions were identified.  Tenosynovial tissue was moderately thickened.  Wound was irrigated and closed with interrupted 5-0 Vicryl Rapide sutures.  Local infiltration with 0.25% Marcaine without epinephrine was given, approximately 6 mL was used. Sterile compressive dressing and splint to the wrist with fingers free was applied.  On deflation of the tourniquet, all fingers immediately pinked.  He was taken to the recovery room for observation in satisfactory condition.  He will be discharged home to return to the [facility] in 1 week on Vicodin.          ______________________________ Cindee Salt, M.D.     GK/MEDQ  D:  11/27/2010  T:  11/28/2010  Job:  [id number]  Electronically Signed by Cindee Salt M.D. on 03/21/2011 09:04:43 AM

## 2011-03-27 ENCOUNTER — Other Ambulatory Visit: Payer: Self-pay | Admitting: Family Medicine

## 2011-03-27 DIAGNOSIS — R1012 Left upper quadrant pain: Secondary | ICD-10-CM

## 2011-03-28 ENCOUNTER — Ambulatory Visit
Admission: RE | Admit: 2011-03-28 | Discharge: 2011-03-28 | Disposition: A | Discharge status: Home / Self Care | Payer: Medicare FFS | Source: Ambulatory Visit | Attending: Family Medicine | Admitting: Family Medicine

## 2011-03-28 DIAGNOSIS — R1012 Left upper quadrant pain: Secondary | ICD-10-CM

## 2011-04-10 ENCOUNTER — Other Ambulatory Visit: Payer: Self-pay | Admitting: Nephrology

## 2011-04-10 ENCOUNTER — Encounter (HOSPITAL_COMMUNITY): Payer: Medicare FFS | Attending: Nephrology

## 2011-04-10 DIAGNOSIS — N183 Chronic kidney disease, stage 3 unspecified: Secondary | ICD-10-CM | POA: Insufficient documentation

## 2011-04-10 DIAGNOSIS — D638 Anemia in other chronic diseases classified elsewhere: Secondary | ICD-10-CM | POA: Insufficient documentation

## 2011-04-10 LAB — POCT HEMOGLOBIN-HEMACUE: Hemoglobin: 9.8 g/dL — ABNORMAL LOW (ref 13.0–17.0)

## 2011-04-10 LAB — RENAL FUNCTION PANEL
Albumin: 3.3 g/dL — ABNORMAL LOW (ref 3.5–5.2)
BUN: 37 mg/dL — ABNORMAL HIGH (ref 6–23)
Chloride: 105 mEq/L (ref 96–112)
GFR calc non Af Amer: 31 mL/min — ABNORMAL LOW (ref >60)
Potassium: 4.9 mEq/L (ref 3.5–5.1)

## 2011-05-08 ENCOUNTER — Encounter (HOSPITAL_COMMUNITY): Payer: Medicare FFS

## 2011-05-19 ENCOUNTER — Encounter (HOSPITAL_COMMUNITY): Payer: Medicare FFS | Attending: Nephrology

## 2011-05-19 ENCOUNTER — Other Ambulatory Visit: Payer: Self-pay | Admitting: Nephrology

## 2011-05-19 DIAGNOSIS — N183 Chronic kidney disease, stage 3 unspecified: Secondary | ICD-10-CM | POA: Insufficient documentation

## 2011-05-19 DIAGNOSIS — D638 Anemia in other chronic diseases classified elsewhere: Secondary | ICD-10-CM | POA: Insufficient documentation

## 2011-05-19 LAB — RENAL FUNCTION PANEL
Albumin: 3.3 g/dL — ABNORMAL LOW (ref 3.5–5.2)
Calcium: 8.9 mg/dL (ref 8.4–10.5)
GFR calc Af Amer: 47 mL/min — ABNORMAL LOW (ref >60)
GFR calc non Af Amer: 39 mL/min — ABNORMAL LOW (ref >60)
Phosphorus: 3.6 mg/dL (ref 2.3–4.6)
Sodium: 136 mEq/L (ref 135–145)

## 2011-05-19 LAB — POCT HEMOGLOBIN-HEMACUE: Hemoglobin: 10.7 g/dL — ABNORMAL LOW (ref 13.0–17.0)

## 2011-05-22 LAB — CBC
Hemoglobin: 11.4 — ABNORMAL LOW
MCHC: 34.1
MCV: 103.6 — ABNORMAL HIGH
RBC: 3.22 — ABNORMAL LOW

## 2011-05-22 LAB — FERRITIN: Ferritin: 34 (ref 22–322)

## 2011-05-22 LAB — IRON AND TIBC: TIBC: 315

## 2011-05-26 LAB — IRON AND TIBC
Iron: 82
Saturation Ratios: 28
UIBC: 207

## 2011-05-28 LAB — IRON AND TIBC
Iron: 81
Saturation Ratios: 27
UIBC: 214

## 2011-05-28 LAB — HEMOGLOBIN AND HEMATOCRIT, BLOOD: HCT: 32.6 — ABNORMAL LOW

## 2011-05-29 LAB — IRON AND TIBC: UIBC: 216

## 2011-05-29 LAB — HEMOGLOBIN AND HEMATOCRIT, BLOOD: HCT: 32 — ABNORMAL LOW

## 2011-06-02 LAB — IRON AND TIBC
Iron: 62 ug/dL (ref 42–135)
TIBC: 317 ug/dL (ref 215–435)
UIBC: 255 ug/dL

## 2011-06-03 LAB — POCT HEMOGLOBIN-HEMACUE: Hemoglobin: 10.3 g/dL — ABNORMAL LOW (ref 13.0–17.0)

## 2011-06-06 LAB — IRON AND TIBC
Iron: 87 ug/dL (ref 42–135)
Saturation Ratios: 29 % (ref 20–55)
UIBC: 208 ug/dL

## 2011-06-09 LAB — CBC
MCHC: 33.8
MCV: 103.5 — ABNORMAL HIGH
Platelets: 211

## 2011-06-09 LAB — IRON AND TIBC: Iron: 99

## 2011-06-11 LAB — CBC
HCT: 35 — ABNORMAL LOW
Hemoglobin: 11.8 — ABNORMAL LOW
MCHC: 33.6
MCV: 104.2 — ABNORMAL HIGH
RDW: 14

## 2011-06-12 LAB — CBC
HCT: 34.7 — ABNORMAL LOW
MCV: 104 — ABNORMAL HIGH
Platelets: 230
RDW: 13.6

## 2011-06-12 LAB — IRON AND TIBC: Saturation Ratios: 38

## 2011-06-16 ENCOUNTER — Other Ambulatory Visit: Payer: Self-pay | Admitting: Nephrology

## 2011-06-16 ENCOUNTER — Encounter (HOSPITAL_COMMUNITY)
Admission: RE | Admit: 2011-06-16 | Discharge: 2011-06-16 | Disposition: A | Discharge status: Home / Self Care | Payer: Medicare FFS | Source: Ambulatory Visit | Attending: Nephrology | Admitting: Nephrology

## 2011-06-16 DIAGNOSIS — N183 Chronic kidney disease, stage 3 unspecified: Secondary | ICD-10-CM | POA: Insufficient documentation

## 2011-06-16 DIAGNOSIS — D638 Anemia in other chronic diseases classified elsewhere: Secondary | ICD-10-CM | POA: Insufficient documentation

## 2011-06-16 LAB — RENAL FUNCTION PANEL
CO2: 18 mEq/L — ABNORMAL LOW (ref 19–32)
GFR calc Af Amer: 36 mL/min — ABNORMAL LOW (ref >90)
Glucose, Bld: 97 mg/dL (ref 70–99)
Phosphorus: 3.7 mg/dL (ref 2.3–4.6)
Potassium: 4.6 mEq/L (ref 3.5–5.1)
Sodium: 135 mEq/L (ref 135–145)

## 2011-06-16 LAB — CBC
HCT: 33 — ABNORMAL LOW
Platelets: 212
WBC: 5.1

## 2011-06-18 LAB — RENAL FUNCTION PANEL
Albumin: 3.4 — ABNORMAL LOW
BUN: 24 — ABNORMAL HIGH
Creatinine, Ser: 1.55 — ABNORMAL HIGH
Phosphorus: 3.9
Potassium: 5.1

## 2011-06-18 LAB — CBC
Hemoglobin: 10.9 — ABNORMAL LOW
MCHC: 34.3
RBC: 3.09 — ABNORMAL LOW
WBC: 4.3

## 2011-06-18 LAB — IRON AND TIBC
Saturation Ratios: 33
TIBC: 281
UIBC: 188

## 2011-06-18 LAB — PTH, INTACT AND CALCIUM
Calcium, Total (PTH): 8.3 — ABNORMAL LOW
PTH: 180.5 — ABNORMAL HIGH

## 2011-07-10 ENCOUNTER — Other Ambulatory Visit (HOSPITAL_COMMUNITY): Payer: Self-pay | Admitting: *Deleted

## 2011-07-14 ENCOUNTER — Encounter (HOSPITAL_COMMUNITY)
Admission: RE | Admit: 2011-07-14 | Discharge: 2011-07-14 | Disposition: A | Discharge status: Home / Self Care | Payer: Medicare FFS | Source: Ambulatory Visit | Attending: Nephrology | Admitting: Nephrology

## 2011-07-14 DIAGNOSIS — N183 Chronic kidney disease, stage 3 unspecified: Secondary | ICD-10-CM | POA: Insufficient documentation

## 2011-07-14 DIAGNOSIS — D638 Anemia in other chronic diseases classified elsewhere: Secondary | ICD-10-CM | POA: Insufficient documentation

## 2011-07-14 LAB — RENAL FUNCTION PANEL
Albumin: 3.1 g/dL — ABNORMAL LOW (ref 3.5–5.2)
BUN: 42 mg/dL — ABNORMAL HIGH (ref 6–23)
Creatinine, Ser: 1.97 mg/dL — ABNORMAL HIGH (ref 0.50–1.35)
Phosphorus: 4.2 mg/dL (ref 2.3–4.6)

## 2011-07-14 LAB — FERRITIN: Ferritin: 185 ng/mL (ref 22–322)

## 2011-07-14 LAB — POCT HEMOGLOBIN-HEMACUE: Hemoglobin: 10.5 g/dL — ABNORMAL LOW (ref 13.0–17.0)

## 2011-07-14 MED ORDER — DARBEPOETIN ALFA-POLYSORBATE 500 MCG/ML IJ SOLN
200.0000 ug | INTRAMUSCULAR | Status: DC
  Administered 2011-07-14: 200 ug via SUBCUTANEOUS
  Filled 2011-07-14: qty 1

## 2011-07-14 MED ORDER — DARBEPOETIN ALFA-POLYSORBATE 200 MCG/0.4ML IJ SOLN
INTRAMUSCULAR | Status: AC
  Filled 2011-07-14: qty 0.4

## 2011-08-11 ENCOUNTER — Encounter (HOSPITAL_COMMUNITY): Payer: Medicare FFS

## 2011-08-11 DIAGNOSIS — D638 Anemia in other chronic diseases classified elsewhere: Secondary | ICD-10-CM | POA: Insufficient documentation

## 2011-08-11 DIAGNOSIS — N183 Chronic kidney disease, stage 3 unspecified: Secondary | ICD-10-CM | POA: Insufficient documentation

## 2011-08-23 ENCOUNTER — Emergency Department (HOSPITAL_COMMUNITY): Payer: Medicare FFS

## 2011-08-23 ENCOUNTER — Emergency Department (HOSPITAL_COMMUNITY)
Admission: EM | Admit: 2011-08-23 | Discharge: 2011-08-23 | Disposition: A | Discharge status: Home / Self Care | Payer: Medicare FFS | Attending: Emergency Medicine | Admitting: Emergency Medicine

## 2011-08-23 ENCOUNTER — Encounter (HOSPITAL_COMMUNITY): Payer: Self-pay | Admitting: *Deleted

## 2011-08-23 DIAGNOSIS — R569 Unspecified convulsions: Secondary | ICD-10-CM | POA: Insufficient documentation

## 2011-08-23 DIAGNOSIS — I2510 Atherosclerotic heart disease of native coronary artery without angina pectoris: Secondary | ICD-10-CM | POA: Insufficient documentation

## 2011-08-23 DIAGNOSIS — I739 Peripheral vascular disease, unspecified: Secondary | ICD-10-CM | POA: Insufficient documentation

## 2011-08-23 DIAGNOSIS — R059 Cough, unspecified: Secondary | ICD-10-CM | POA: Insufficient documentation

## 2011-08-23 DIAGNOSIS — I10 Essential (primary) hypertension: Secondary | ICD-10-CM | POA: Insufficient documentation

## 2011-08-23 DIAGNOSIS — R062 Wheezing: Secondary | ICD-10-CM | POA: Insufficient documentation

## 2011-08-23 DIAGNOSIS — J069 Acute upper respiratory infection, unspecified: Secondary | ICD-10-CM

## 2011-08-23 DIAGNOSIS — R05 Cough: Secondary | ICD-10-CM | POA: Insufficient documentation

## 2011-08-23 MED ORDER — DEXAMETHASONE 6 MG PO TABS
10.0000 mg | ORAL_TABLET | ORAL | Status: AC
  Administered 2011-08-23: 10 mg via ORAL
  Filled 2011-08-23: qty 1

## 2011-08-23 NOTE — ED Notes (Addendum)
Patient C/O feeling sick. C/O cold like symptoms, nausea and vomiting.

## 2011-08-23 NOTE — ED Notes (Signed)
Patient coughing and congested for a week.  Patient had the flu shot and now his ears are hurting as well.

## 2011-08-25 NOTE — ED Provider Notes (Signed)
History     CSN: [id number]  Arrival date & time 08/23/11  1645   First MD Initiated Contact with Patient 08/23/11 2048      Chief Complaint  Patient presents with  . URI    (Consider location/radiation/quality/duration/timing/severity/associated sxs/prior treatment) Patient is a 77 y.o. male presenting with URI. The history is provided by the patient.  URI The primary symptoms include cough and wheezing. Primary symptoms do not include fever, fatigue, nausea or vomiting. The current episode started 6 to 7 days ago. This is a new problem. The problem has been gradually worsening.  Risk factors for severe complications from URI include being elderly.    Past Medical History  Diagnosis Date  . Hypertension   . Chronic low back pain     right leg pain   . Hip pain     left  . Seizures   . ETOHism   . Cataract   . CAD (coronary artery disease)   . PVD (posterior vitreous detachment), left eye   . Anemia     CKD  . Colon polyps     Past Surgical History  Procedure Date  . Cataracts surgery     History reviewed. No pertinent family history.  History  Substance Use Topics  . Smoking status: Former Games developer  . Smokeless tobacco: Not on file  . Alcohol Use: No      Review of Systems  Constitutional: Negative for fever and fatigue.  Respiratory: Positive for cough and wheezing.   Gastrointestinal: Negative for nausea and vomiting.  All other systems reviewed and are negative.    Allergies  Darvocet; Penicillins; and Sulfonamide derivatives  Home Medications   Current Outpatient Rx  Name Route Sig Dispense Refill  . AMLODIPINE BESYLATE 10 MG PO TABS Oral Take 10 mg by mouth daily.      . ATORVASTATIN CALCIUM 40 MG PO TABS Oral Take 40 mg by mouth daily.      Marland Kitchen BENAZEPRIL HCL 10 MG PO TABS Oral Take 10 mg by mouth daily.      Marland Kitchen CALCITRIOL 0.25 MCG PO CAPS Oral Take 0.25 mcg by mouth daily.      Marland Kitchen CLOPIDOGREL BISULFATE 75 MG PO TABS Oral Take 75 mg by mouth  daily.      Marland Kitchen FERROUS SULFATE 325 (65 FE) MG PO TABS Oral Take 325 mg by mouth daily with breakfast.      . HYDROCODONE-ACETAMINOPHEN 7.5-500 MG PO TABS Oral Take 1 tablet by mouth 4 (four) times daily.      Marland Kitchen PHENYTOIN SODIUM EXTENDED 100 MG PO CAPS Oral Take 300 mg by mouth daily. 3 in morning and 2 in evening    . SODIUM BICARBONATE 650 MG PO TABS Oral Take 650 mg by mouth 2 (two) times daily.        BP 140/78  Pulse 87  Temp(Src) 97 F (36.1 C) (Oral)  Resp 20  SpO2 99%  Physical Exam  Nursing note and vitals reviewed. Constitutional: He is oriented to person, place, and time. He appears well-developed and well-nourished. No distress.  HENT:  Head: Normocephalic and atraumatic.  Neck: Normal range of motion. Neck supple.  Cardiovascular: Normal rate and regular rhythm.   No murmur heard. Pulmonary/Chest: Effort normal and breath sounds normal. No respiratory distress. He has no wheezes. He has no rales.  Abdominal: Soft. Bowel sounds are normal. He exhibits no distension. There is no tenderness.  Musculoskeletal: Normal range of motion. He exhibits no edema.  Lymphadenopathy:    He has no cervical adenopathy.  Neurological: He is alert and oriented to person, place, and time.  Skin: Skin is warm and dry.    ED Course  Procedures (including critical care time)  Labs Reviewed - No data to display Dg Chest 2 View  08/23/2011  *RADIOLOGY REPORT*  Clinical Data: Cough, shortness of breath and vomiting.  CHEST - 2 VIEW  Comparison: Chest radiograph performed 09/04/2010  Findings: The lungs are well-aerated and clear.  There is no evidence of focal opacification, pleural effusion or pneumothorax.  The heart is normal in size; the mediastinal contour is within normal limits.  No acute osseous abnormalities are seen.  Mild degenerative change is noted at the left humeral head; there is superior subluxation of both humeral heads.  A moderate to large hiatal hernia is again noted.   IMPRESSION:  1.  No acute cardiopulmonary process seen. 2.  Moderate to large hiatal hernia again noted. 3.  Mild degenerative change at the left humeral head.  Original Report Authenticated By: Tonia Ghent, M.D.     1. Upper respiratory infection       MDM  No hypoxia, cxr looks okay.  Will discharge with antibiotics, time.  To return prn if worsens.        Geoffery Lyons, MD 08/25/11 [phone]

## 2011-09-01 ENCOUNTER — Encounter (HOSPITAL_COMMUNITY)
Admission: RE | Admit: 2011-09-01 | Discharge: 2011-09-01 | Disposition: A | Discharge status: Home / Self Care | Payer: Medicare FFS | Source: Ambulatory Visit | Attending: Nephrology | Admitting: Nephrology

## 2011-09-01 LAB — RENAL FUNCTION PANEL
Albumin: 2.9 g/dL — ABNORMAL LOW (ref 3.5–5.2)
Chloride: 109 mEq/L (ref 96–112)
GFR calc non Af Amer: 22 mL/min — ABNORMAL LOW (ref >90)
Potassium: 5.1 mEq/L (ref 3.5–5.1)

## 2011-09-01 LAB — POCT HEMOGLOBIN-HEMACUE: Hemoglobin: 10.3 g/dL — ABNORMAL LOW (ref 13.0–17.0)

## 2011-09-01 MED ORDER — DARBEPOETIN ALFA-POLYSORBATE 200 MCG/0.4ML IJ SOLN
INTRAMUSCULAR | Status: AC
  Administered 2011-09-01: 200 ug via SUBCUTANEOUS
  Filled 2011-09-01: qty 0.4

## 2011-09-01 MED ORDER — DARBEPOETIN ALFA-POLYSORBATE 500 MCG/ML IJ SOLN
200.0000 ug | INTRAMUSCULAR | Status: DC
  Filled 2011-09-01: qty 1

## 2011-09-11 ENCOUNTER — Emergency Department (HOSPITAL_COMMUNITY)
Admission: EM | Admit: 2011-09-11 | Discharge: 2011-09-11 | Disposition: A | Discharge status: Home / Self Care | Payer: Medicare FFS | Attending: Emergency Medicine | Admitting: Emergency Medicine

## 2011-09-11 ENCOUNTER — Other Ambulatory Visit: Payer: Self-pay

## 2011-09-11 ENCOUNTER — Emergency Department (HOSPITAL_COMMUNITY): Payer: Medicare FFS

## 2011-09-11 ENCOUNTER — Encounter (HOSPITAL_COMMUNITY): Payer: Self-pay

## 2011-09-11 DIAGNOSIS — Z79899 Other long term (current) drug therapy: Secondary | ICD-10-CM | POA: Insufficient documentation

## 2011-09-11 DIAGNOSIS — I2510 Atherosclerotic heart disease of native coronary artery without angina pectoris: Secondary | ICD-10-CM | POA: Insufficient documentation

## 2011-09-11 DIAGNOSIS — I10 Essential (primary) hypertension: Secondary | ICD-10-CM | POA: Insufficient documentation

## 2011-09-11 DIAGNOSIS — R0602 Shortness of breath: Secondary | ICD-10-CM | POA: Insufficient documentation

## 2011-09-11 DIAGNOSIS — N289 Disorder of kidney and ureter, unspecified: Secondary | ICD-10-CM | POA: Insufficient documentation

## 2011-09-11 DIAGNOSIS — E875 Hyperkalemia: Secondary | ICD-10-CM | POA: Insufficient documentation

## 2011-09-11 LAB — CBC
HCT: 33.7 % — ABNORMAL LOW (ref 39.0–52.0)
MCH: 37.3 pg — ABNORMAL HIGH (ref 26.0–34.0)
MCHC: 33.8 g/dL (ref 30.0–36.0)
MCV: 110.1 fL — ABNORMAL HIGH (ref 78.0–100.0)
Platelets: 333 10*3/uL (ref 150–400)
RDW: 15.3 % (ref 11.5–15.5)

## 2011-09-11 LAB — URINALYSIS, ROUTINE W REFLEX MICROSCOPIC
Bilirubin Urine: NEGATIVE
Glucose, UA: NEGATIVE mg/dL
Ketones, ur: NEGATIVE mg/dL
Nitrite: NEGATIVE
Protein, ur: 100 mg/dL — AB
pH: 5 (ref 5.0–8.0)

## 2011-09-11 LAB — HEPATIC FUNCTION PANEL
ALT: 16 U/L (ref 0–53)
AST: 25 U/L (ref 0–37)
Bilirubin, Direct: <0.1 mg/dL (ref 0.0–0.3)
Total Bilirubin: 0.1 mg/dL — ABNORMAL LOW (ref 0.3–1.2)

## 2011-09-11 LAB — BASIC METABOLIC PANEL
BUN: 73 mg/dL — ABNORMAL HIGH (ref 6–23)
Calcium: 9 mg/dL (ref 8.4–10.5)
Chloride: 104 mEq/L (ref 96–112)
Creatinine, Ser: 2.67 mg/dL — ABNORMAL HIGH (ref 0.50–1.35)
GFR calc Af Amer: 24 mL/min — ABNORMAL LOW (ref >90)

## 2011-09-11 LAB — DIFFERENTIAL
Basophils Absolute: 0.1 10*3/uL (ref 0.0–0.1)
Lymphs Abs: 1.4 10*3/uL (ref 0.7–4.0)
Monocytes Absolute: 0.6 10*3/uL (ref 0.1–1.0)
Monocytes Relative: 14 % — ABNORMAL HIGH (ref 3–12)
Neutro Abs: 2.2 10*3/uL (ref 1.7–7.7)

## 2011-09-11 MED ORDER — SODIUM POLYSTYRENE SULFONATE 15 GM/60ML PO SUSP: 15.0000 g | Freq: Once | ORAL | Status: AC

## 2011-09-11 MED ORDER — SODIUM POLYSTYRENE SULFONATE 15 GM/60ML PO SUSP
30.0000 g | Freq: Once | ORAL | Status: AC
  Administered 2011-09-11: 30 g via ORAL
  Filled 2011-09-11: qty 120

## 2011-09-11 NOTE — ED Notes (Signed)
Pt was sent in from the dr. Isidore Moos with [facility]. He went to have regular blood work drawn and for the last 2 blood draws his potassium levels have been elevating. The doctor had called and stated that they wanted blood work done to make sure that he was not in renal failure. Pt denies any pain.

## 2011-09-11 NOTE — ED Provider Notes (Signed)
History     CSN: [id number]  Arrival date & time 09/11/11  1319   First MD Initiated Contact with Patient 09/11/11 1606      Chief Complaint  Patient presents with  . Labs Only    (Consider location/radiation/quality/duration/timing/severity/associated sxs/prior treatment) The history is provided by the patient.   78 year old male was referred to the emergency department to check his potassium. He has been running high potassiums as an outpatient. 6 days ago, he was told to stop taking Pentasa pro because his kidneys were not working well and his potassium was going up. Potassium was repeated and continued to rise and he was told to come to the emergency department. He denies any symptoms at home. He denies chest pain, heaviness, tightness, or pressure. Denies dyspnea. He denies weakness, nausea, vomiting. He stopped eating bananas because of the report of his high potassium although he does admit that he had been drinking V8 juice which evidently is fairly high in potassium.  Past Medical History  Diagnosis Date  . Hypertension   . Chronic low back pain     right leg pain   . Hip pain     left  . Seizures   . ETOHism   . Cataract   . CAD (coronary artery disease)   . PVD (posterior vitreous detachment), left eye   . Anemia     CKD  . Colon polyps     Past Surgical History  Procedure Date  . Cataracts surgery     History reviewed. No pertinent family history.  History  Substance Use Topics  . Smoking status: Former Games developer  . Smokeless tobacco: Not on file  . Alcohol Use: No      Review of Systems  All other systems reviewed and are negative.    Allergies  Darvocet; Penicillins; and Sulfonamide derivatives  Home Medications   Current Outpatient Rx  Name Route Sig Dispense Refill  . AMLODIPINE BESYLATE 10 MG PO TABS Oral Take 10 mg by mouth daily.      . ATORVASTATIN CALCIUM 40 MG PO TABS Oral Take 40 mg by mouth at bedtime.     Marland Kitchen BENAZEPRIL HCL 10 MG PO  TABS Oral Take 10 mg by mouth daily.     Marland Kitchen CALCITRIOL 0.25 MCG PO CAPS Oral Take 0.25 mcg by mouth daily.      Marland Kitchen CLOPIDOGREL BISULFATE 75 MG PO TABS Oral Take 75 mg by mouth daily.      Marland Kitchen DARBEPOETIN ALFA-POLYSORBATE 200 MCG/0.4ML IJ SOLN Subcutaneous Inject 200 mcg into the skin every 28 (twenty-eight) days. Last dose was 09/02/2011    . DOXYCYCLINE HYCLATE 100 MG PO TABS Oral Take 100 mg by mouth 2 (two) times daily. For 10 days. Patient filled medication on 09/03/11.    Marland Kitchen FERROUS SULFATE 325 (65 FE) MG PO TABS Oral Take 325 mg by mouth daily with breakfast.      . HYDROCODONE-ACETAMINOPHEN 7.5-500 MG PO TABS Oral Take 1 tablet by mouth 4 (four) times daily.      Marland Kitchen MECLIZINE HCL 12.5 MG PO TABS Oral Take 12.5 mg by mouth 3 (three) times daily. Three times a day for 10 days.    Marland Kitchen PHENYTOIN SODIUM EXTENDED 100 MG PO CAPS Oral Take 300 mg by mouth daily. 3 in morning and 2 in evening    . SODIUM BICARBONATE 650 MG PO TABS Oral Take 650 mg by mouth 2 (two) times daily.        BP  134/78  Pulse 60  Temp(Src) 98.1 F (36.7 C) (Oral)  Resp 20  SpO2 100%  Physical Exam  Nursing note and vitals reviewed.  78 year old male who is resting comfortably and in no acute distress. Vital signs are normal. Oxygen saturation is 100% which is normal. Head is normocephalic and atraumatic. PERRLA, EOMI. Oropharynx is clear. Neck is supple without adenopathy. Lungs are clear without rales, wheezes, or rhonchi. Heart has regular rate and rhythm without murmur. Abdomen is soft, flat, nontender without masses or hepatosplenomegaly. Extremities no cyanosis or edema, full range of motion is present. Skin is warm and moist without rash. Neurologic: Mental status is normal, cranial nerves are intact, there no focal motor or sensory deficits.  ED Course  Procedures (including critical care time)  Labs Reviewed  CBC - Abnormal; Notable for the following:    RBC 3.06 (*)    Hemoglobin 11.4 (*)    HCT 33.7 (*)    MCV  110.1 (*)    MCH 37.3 (*)    All other components within normal limits  DIFFERENTIAL - Abnormal; Notable for the following:    Monocytes Relative 14 (*)    Basophils Relative 2 (*)    All other components within normal limits  BASIC METABOLIC PANEL - Abnormal; Notable for the following:    Sodium 133 (*)    Potassium 5.8 (*)    CO2 17 (*)    BUN 73 (*)    Creatinine, Ser 2.67 (*)    GFR calc non Af Amer 21 (*)    GFR calc Af Amer 24 (*)    All other components within normal limits  URINALYSIS, ROUTINE W REFLEX MICROSCOPIC - Abnormal; Notable for the following:    Hgb urine dipstick TRACE (*)    Protein, ur 100 (*)    All other components within normal limits  HEPATIC FUNCTION PANEL - Abnormal; Notable for the following:    Albumin 3.1 (*)    Total Bilirubin 0.1 (*)    All other components within normal limits  URINE MICROSCOPIC-ADD ON - Abnormal; Notable for the following:    Casts HYALINE CASTS (*)    All other components within normal limits   Dg Chest 2 View  09/11/2011  *RADIOLOGY REPORT*  Clinical Data: Shortness of breath, hypertension, renal failure  CHEST - 2 VIEW  Comparison: 08/23/2011  Findings: Normal heart size and pulmonary vascularity. Calcified tortuous thoracic aorta. Moderate sized hiatal hernia. Emphysematous changes with minimal atelectasis at left base. No acute infiltrate, pleural effusion or pneumothorax. Bones appear diffusely demineralized. Sclerosis and deformity of left humeral head, question degenerative versus sequela of prior trauma or avascular necrosis. Chronic compression deformity of a mid to lower thoracic vertebra.  IMPRESSION: Emphysematous changes with minimal left basilar atelectasis.  Original Report Authenticated By: Lollie Marrow, M.D.     No diagnosis found.   Date: 09/11/2011  Rate: 56  Rhythm: sinus bradycardia and premature atrial contractions (PAC)  QRS Axis: normal  Intervals: normal  ST/T Wave abnormalities: normal  Conduction  Disutrbances:none  Narrative Interpretation: Low-voltage, otherwise normal ECG. No tall or peaked T waves and no QRS widening to suggest hyperkalemia  Old EKG Reviewed: none available  After a dose of Kayexalate, his potassium has come down to 5.0. He is supposed to see his kidney doctor next week. He will be sent home with a prescription for once a day Kayexalate to keep his potassium from going up.  MDM  Hyperkalemia and progressive renal  insufficiency. EKG shows no signs of hyperkalemia. He will be given a dose of Kayexalate and potassium rechecked.        Dione Booze, MD 09/11/11 2217

## 2011-09-11 NOTE — ED Provider Notes (Signed)
Medically screened by me patient sent in by brown some of primary care physicians for gradual worsening of potassium her labs show that on December 31 his potassium was 5.1 today is 5.8 also a gradual worsening of his creatinine mid-November is 1.97 today's 2.67 also his CO2 is low at 17 will put on cardiac monitor did EKG chest x-ray additional labs are moved to the main side of the ED for further evaluation may very well require admission.     Shelda Jakes, MD 09/11/11 [phone]

## 2011-09-12 NOTE — ED Notes (Signed)
Rx called to [facility] on [address] [phone]

## 2011-09-24 ENCOUNTER — Other Ambulatory Visit (HOSPITAL_COMMUNITY): Payer: Self-pay | Admitting: *Deleted

## 2011-09-29 ENCOUNTER — Encounter (HOSPITAL_COMMUNITY)
Admission: RE | Admit: 2011-09-29 | Discharge: 2011-09-29 | Disposition: A | Discharge status: Home / Self Care | Payer: Medicare FFS | Source: Ambulatory Visit | Attending: Nephrology | Admitting: Nephrology

## 2011-09-29 DIAGNOSIS — N183 Chronic kidney disease, stage 3 unspecified: Secondary | ICD-10-CM | POA: Insufficient documentation

## 2011-09-29 DIAGNOSIS — D638 Anemia in other chronic diseases classified elsewhere: Secondary | ICD-10-CM | POA: Insufficient documentation

## 2011-09-29 LAB — RENAL FUNCTION PANEL
BUN: 41 mg/dL — ABNORMAL HIGH (ref 6–23)
CO2: 20 mEq/L (ref 19–32)
GFR calc Af Amer: 27 mL/min — ABNORMAL LOW (ref >90)
Glucose, Bld: 144 mg/dL — ABNORMAL HIGH (ref 70–99)
Potassium: 4.9 mEq/L (ref 3.5–5.1)
Sodium: 138 mEq/L (ref 135–145)

## 2011-09-29 LAB — IRON AND TIBC
Iron: 100 ug/dL (ref 42–135)
Saturation Ratios: 39 % (ref 20–55)
TIBC: 258 ug/dL (ref 215–435)
UIBC: 158 ug/dL (ref 125–400)

## 2011-09-29 MED ORDER — DARBEPOETIN ALFA-POLYSORBATE 500 MCG/ML IJ SOLN
200.0000 ug | INTRAMUSCULAR | Status: DC
  Administered 2011-09-29: 200 ug via SUBCUTANEOUS

## 2011-09-29 MED ORDER — DARBEPOETIN ALFA-POLYSORBATE 200 MCG/0.4ML IJ SOLN
INTRAMUSCULAR | Status: AC
  Filled 2011-09-29: qty 0.4

## 2011-09-29 MED FILL — Darbepoetin Alfa-Polysorbate 80 Soln Inj 200 MCG/0.4ML: INTRAMUSCULAR | Qty: 0.4 | Status: AC

## 2011-10-23 ENCOUNTER — Encounter (HOSPITAL_COMMUNITY)
Admission: RE | Admit: 2011-10-23 | Discharge: 2011-10-23 | Disposition: A | Discharge status: Home / Self Care | Payer: Medicare FFS | Source: Ambulatory Visit | Attending: Nephrology | Admitting: Nephrology

## 2011-10-23 ENCOUNTER — Encounter (HOSPITAL_COMMUNITY): Payer: Self-pay

## 2011-10-23 ENCOUNTER — Emergency Department (HOSPITAL_COMMUNITY)
Admission: EM | Admit: 2011-10-23 | Discharge: 2011-10-23 | Disposition: A | Discharge status: Home / Self Care | Payer: Medicare FFS | Attending: Emergency Medicine | Admitting: Emergency Medicine

## 2011-10-23 DIAGNOSIS — M25519 Pain in unspecified shoulder: Secondary | ICD-10-CM | POA: Insufficient documentation

## 2011-10-23 DIAGNOSIS — D638 Anemia in other chronic diseases classified elsewhere: Secondary | ICD-10-CM | POA: Insufficient documentation

## 2011-10-23 DIAGNOSIS — N183 Chronic kidney disease, stage 3 unspecified: Secondary | ICD-10-CM | POA: Insufficient documentation

## 2011-10-23 DIAGNOSIS — I10 Essential (primary) hypertension: Secondary | ICD-10-CM | POA: Insufficient documentation

## 2011-10-23 DIAGNOSIS — M542 Cervicalgia: Secondary | ICD-10-CM | POA: Insufficient documentation

## 2011-10-23 DIAGNOSIS — M25511 Pain in right shoulder: Secondary | ICD-10-CM

## 2011-10-23 DIAGNOSIS — R569 Unspecified convulsions: Secondary | ICD-10-CM | POA: Insufficient documentation

## 2011-10-23 DIAGNOSIS — I2510 Atherosclerotic heart disease of native coronary artery without angina pectoris: Secondary | ICD-10-CM | POA: Insufficient documentation

## 2011-10-23 LAB — POCT HEMOGLOBIN-HEMACUE: Hemoglobin: 9.5 g/dL — ABNORMAL LOW (ref 13.0–17.0)

## 2011-10-23 MED ORDER — HYDROCODONE-ACETAMINOPHEN 5-325 MG PO TABS
2.0000 | ORAL_TABLET | Freq: Once | ORAL | Status: AC
  Administered 2011-10-23: 2 via ORAL
  Filled 2011-10-23 (×2): qty 1

## 2011-10-23 MED ORDER — DIAZEPAM 5 MG PO TABS
5.0000 mg | ORAL_TABLET | Freq: Once | ORAL | Status: AC
  Administered 2011-10-23: 5 mg via ORAL
  Filled 2011-10-23: qty 1

## 2011-10-23 MED ORDER — DARBEPOETIN ALFA-POLYSORBATE 200 MCG/0.4ML IJ SOLN
200.0000 ug | INTRAMUSCULAR | Status: DC
  Administered 2011-10-23: 200 ug via INTRAVENOUS
  Filled 2011-10-23: qty 0.4

## 2011-10-23 MED ORDER — DIAZEPAM 5 MG PO TABS: ORAL_TABLET | ORAL | Status: AC

## 2011-10-23 NOTE — ED Notes (Signed)
Discharged home with son. Verbalized understanding of instructions and followup

## 2011-10-23 NOTE — ED Notes (Signed)
Pt states that he has hx of shoulder dislocation. He states that over the past couple of days he has been having increasing neck and shoulder pain. He states that he was told that he has a pinched nerve in this area and thinks that it may be aggravated. No meds pta.

## 2011-10-23 NOTE — ED Provider Notes (Signed)
History     CSN: [id number]  Arrival date & time 10/23/11  4098   First MD Initiated Contact with Patient 10/23/11 [phone]      Chief Complaint  Patient presents with  . Neck Pain  . Shoulder Pain    (Consider location/radiation/quality/duration/timing/severity/associated sxs/prior treatment) HPI  Patient presents to ER complaining of a multiple month hx of right shoulder pain stating that approximately a year ago he suffered a right shoulder dislocation and since relocation he has intermittent pain in right shoulder with ROM but states that over the last few days increased pain with ROM and therefore decreased ROM due to pain. He denies extremity numbness/tingling/weakness. Pain is in right lateral lower neck and into posterior shoulder. Patient states he takes as needed hydrocodone-acetaminophen at home for pain with only temporary mild relief in symptoms. Patient was originally seen by Dr. Merlyn Lot for injury but has not returned to see him for further evaluation of ongoing shoulder pain. He denies additional injury to shoulder, skin changes, swelling, edema, redness, CP, SOB, n/v.   Past Medical History  Diagnosis Date  . Hypertension   . Chronic low back pain     right leg pain   . Hip pain     left  . Seizures   . ETOHism   . Cataract   . CAD (coronary artery disease)   . PVD (posterior vitreous detachment), left eye   . Anemia     CKD  . Colon polyps     Past Surgical History  Procedure Date  . Cataracts surgery     History reviewed. No pertinent family history.  History  Substance Use Topics  . Smoking status: Former Games developer  . Smokeless tobacco: Not on file  . Alcohol Use: No      Review of Systems  All other systems reviewed and are negative.    Allergies  Darvocet; Penicillins; and Sulfonamide derivatives  Home Medications   Current Outpatient Rx  Name Route Sig Dispense Refill  . AMLODIPINE BESYLATE 10 MG PO TABS Oral Take 10 mg by mouth daily.       . ATORVASTATIN CALCIUM 40 MG PO TABS Oral Take 40 mg by mouth at bedtime.     Marland Kitchen CALCITRIOL 0.25 MCG PO CAPS Oral Take 0.25 mcg by mouth daily.      Marland Kitchen CLOPIDOGREL BISULFATE 75 MG PO TABS Oral Take 75 mg by mouth daily.      Marland Kitchen DARBEPOETIN ALFA-POLYSORBATE 200 MCG/0.4ML IJ SOLN Subcutaneous Inject 200 mcg into the skin every 28 (twenty-eight) days. Last dose was 09/02/2011    . FERROUS SULFATE 325 (65 FE) MG PO TABS Oral Take 325 mg by mouth daily with breakfast.      . HYDROCODONE-ACETAMINOPHEN 7.5-500 MG PO TABS Oral Take 1 tablet by mouth 4 (four) times daily.      Marland Kitchen MECLIZINE HCL 12.5 MG PO TABS Oral Take 12.5 mg by mouth 3 (three) times daily. Three times a day for 10 days.    Marland Kitchen PHENYTOIN SODIUM EXTENDED 100 MG PO CAPS Oral Take 300 mg by mouth daily. 3 in morning and 2 in evening    . SODIUM BICARBONATE 650 MG PO TABS Oral Take 650 mg by mouth 2 (two) times daily.        BP 185/78  Pulse 87  Temp(Src) 98 F (36.7 C) (Oral)  Resp 20  SpO2 100%  Physical Exam  Nursing note and vitals reviewed. Constitutional: He is oriented to person, place, and  time. He appears well-developed and well-nourished. No distress.  HENT:  Head: Normocephalic and atraumatic.  Eyes: Conjunctivae are normal.  Neck: Normal range of motion. Neck supple. No tracheal deviation present. No thyromegaly present.       TTP of soft tissue of right lateral lower neck into trapezious muscle but no skin changes, crepitous or bruit  Cardiovascular: Normal rate, regular rhythm, normal heart sounds and intact distal pulses.  Exam reveals no gallop and no friction rub.   No murmur heard. Pulmonary/Chest: Effort normal and breath sounds normal. No respiratory distress. He has no wheezes. He has no rales. He exhibits no tenderness.  Abdominal: Bowel sounds are normal. He exhibits no distension and no mass. There is no tenderness. There is no rebound and no guarding.  Musculoskeletal: He exhibits tenderness. He exhibits no  edema.       Pain with abduction and flexion of right shoulder with movement to 90 degrees with pain but no crepitous. No erythema, edema or heat of joint. 5/5 grip strength  No midline cervical spine TTP.   Neurological: He is alert and oriented to person, place, and time.  Skin: Skin is warm and dry. No rash noted. He is not diaphoretic. No erythema.  Psychiatric: He has a normal mood and affect.    ED Course  Procedures (including critical care time)  PO valium, PO vicodin  Labs Reviewed - No data to display No results found.   1. HTN (hypertension)   2. Right shoulder pain       MDM  Old injury to right shoulder with right UE neurovasc intact with pain and decreased ROM questioning rotator cuff syndrome vs arthritis of joint. Patient agreeable to follow up with PCP for HTN and ortho for ongoing evaluation of shoulder pain.         Jenness Corner, [place] 10/23/11 1043

## 2011-10-23 NOTE — ED Provider Notes (Signed)
Medical screening examination/treatment/procedure(s) were conducted as a shared visit with non-physician practitioner(s) and myself.  I personally evaluated the patient during the encounter  Reproducible right shoulder trapezius and paracervical neck tenderness with negative shoulder drop test and normal light touch over the deltoid as well as intact distal sensation and motor strength to the right handed distributions of the median, radial, and ulnar nerve function with capillary refill less than 2 seconds to the right hand.  Hurman Horn, MD 10/23/11 [phone]

## 2011-10-23 NOTE — Discharge Instructions (Signed)
Continue your at home hydrocodone-acetaminophen for pain using valium for muscle relaxation. Also apply ice to areas of soreness for 15 minutes out of every hour. Both pain meds and valium can cause sedation so do not drive with use and be careful with standing to walk. Follow up with Dr. Merlyn Lot for recheck of ongoing shoulder pain and follow up with your primary care provider for ongoing management of your high blood pressure.   Arthralgia Your caregiver has diagnosed you as suffering from an arthralgia. Arthralgia means there is pain in a joint. This can come from many reasons including:  Bruising the joint which causes soreness (inflammation) in the joint.   Wear and tear on the joints which occur as we grow older (osteoarthritis).   Overusing the joint.   Various forms of arthritis.   Infections of the joint.  Regardless of the cause of pain in your joint, most of these different pains respond to anti-inflammatory drugs and rest. The exception to this is when a joint is infected, and these cases are treated with antibiotics, if it is a bacterial infection. HOME CARE INSTRUCTIONS   Rest the injured area for as long as directed by your caregiver. Then slowly start using the joint as directed by your caregiver and as the pain allows. Crutches as directed may be useful if the ankles, knees or hips are involved. If the knee was splinted or casted, continue use and care as directed. If an stretchy or elastic wrapping bandage has been applied today, it should be removed and re-applied every 3 to 4 hours. It should not be applied tightly, but firmly enough to keep swelling down. Watch toes and feet for swelling, bluish discoloration, coldness, numbness or excessive pain. If any of these problems (symptoms) occur, remove the ace bandage and re-apply more loosely. If these symptoms persist, contact your caregiver or return to this location.   For the first 24 hours, keep the injured extremity elevated on  pillows while lying down.   Apply ice for 15 to 20 minutes to the sore joint every couple hours while awake for the first half day. Then 3 to 4 times per day for the first 48 hours. Put the ice in a plastic bag and place a towel between the bag of ice and your skin.   Wear any splinting, casting, elastic bandage applications, or slings as instructed.   Only take over-the-counter or prescription medicines for pain, discomfort, or fever as directed by your caregiver. Do not use aspirin immediately after the injury unless instructed by your physician. Aspirin can cause increased bleeding and bruising of the tissues.   If you were given crutches, continue to use them as instructed and do not resume weight bearing on the sore joint until instructed.  Persistent pain and inability to use the sore joint as directed for more than 2 to 3 days are warning signs indicating that you should see a caregiver for a follow-up visit as soon as possible. Initially, a hairline fracture (break in bone) may not be evident on X-rays. Persistent pain and swelling indicate that further evaluation, non-weight bearing or use of the joint (use of crutches or slings as instructed), or further X-rays are indicated. X-rays may sometimes not show a small fracture until a week or 10 days later. Make a follow-up appointment with your own caregiver or one to whom we have referred you. A radiologist (specialist in reading X-rays) may read your X-rays. Make sure you know how you are  to obtain your X-ray results. Do not assume everything is normal if you do not hear from [place]. SEEK MEDICAL CARE IF: Bruising, swelling, or pain increases. SEEK IMMEDIATE MEDICAL CARE IF:   Your fingers or toes are numb or blue.   The pain is not responding to medications and continues to stay the same or get worse.   The pain in your joint becomes severe.   You develop a fever over 102 F (38.9 C).   It becomes impossible to move or use the joint.    MAKE SURE YOU:   Understand these instructions.   Will watch your condition.   Will get help right away if you are not doing well or get worse.  Document Released: 08/18/2005 Document Revised: 04/30/2011 Document Reviewed: 04/05/2008 [facility] Patient Information 2012 [place], [place].

## 2011-10-27 ENCOUNTER — Encounter (HOSPITAL_COMMUNITY): Payer: Medicare FFS

## 2011-11-19 ENCOUNTER — Encounter (HOSPITAL_COMMUNITY): Payer: Self-pay | Admitting: *Deleted

## 2011-11-19 ENCOUNTER — Other Ambulatory Visit (HOSPITAL_COMMUNITY): Payer: Self-pay | Admitting: *Deleted

## 2011-11-19 ENCOUNTER — Emergency Department (HOSPITAL_COMMUNITY): Payer: Medicare FFS

## 2011-11-19 ENCOUNTER — Emergency Department (HOSPITAL_COMMUNITY)
Admission: EM | Admit: 2011-11-19 | Discharge: 2011-11-19 | Disposition: A | Discharge status: Home / Self Care | Payer: Medicare FFS | Attending: Emergency Medicine | Admitting: Emergency Medicine

## 2011-11-19 DIAGNOSIS — I10 Essential (primary) hypertension: Secondary | ICD-10-CM | POA: Insufficient documentation

## 2011-11-19 DIAGNOSIS — M25519 Pain in unspecified shoulder: Secondary | ICD-10-CM | POA: Insufficient documentation

## 2011-11-19 DIAGNOSIS — Z79899 Other long term (current) drug therapy: Secondary | ICD-10-CM | POA: Insufficient documentation

## 2011-11-19 DIAGNOSIS — G8929 Other chronic pain: Secondary | ICD-10-CM | POA: Insufficient documentation

## 2011-11-19 DIAGNOSIS — I2510 Atherosclerotic heart disease of native coronary artery without angina pectoris: Secondary | ICD-10-CM | POA: Insufficient documentation

## 2011-11-19 DIAGNOSIS — M25512 Pain in left shoulder: Secondary | ICD-10-CM

## 2011-11-19 MED ORDER — DIAZEPAM 5 MG PO TABS
5.0000 mg | ORAL_TABLET | Freq: Once | ORAL | Status: AC
  Administered 2011-11-19: 5 mg via ORAL
  Filled 2011-11-19: qty 1

## 2011-11-19 MED ORDER — HYDROCODONE-ACETAMINOPHEN 5-500 MG PO TABS: 1.0000 | ORAL_TABLET | Freq: Four times a day (QID) | ORAL | Status: AC | PRN

## 2011-11-19 MED ORDER — OXYCODONE-ACETAMINOPHEN 5-325 MG PO TABS
1.0000 | ORAL_TABLET | Freq: Once | ORAL | Status: AC
  Administered 2011-11-19: 1 via ORAL
  Filled 2011-11-19: qty 1

## 2011-11-19 MED ORDER — DIAZEPAM 5 MG PO TABS: 5.0000 mg | ORAL_TABLET | Freq: Three times a day (TID) | ORAL | Status: AC | PRN

## 2011-11-19 NOTE — ED Provider Notes (Signed)
History     CSN: [id number]  Arrival date & time 11/19/11  0865   First MD Initiated Contact with Patient 11/19/11 0920      Chief Complaint  Patient presents with  . Shoulder Pain    (Consider location/radiation/quality/duration/timing/severity/associated sxs/prior treatment) Patient is a 78 y.o. male presenting with shoulder pain. The history is provided by the patient and a relative.  Shoulder Pain This is a chronic problem. The current episode started more than 1 year ago. The problem occurs constantly. The problem has been gradually worsening. Associated symptoms include arthralgias, myalgias and neck pain. Pertinent negatives include no abdominal pain, chest pain, chills, coughing, diaphoresis, fever, nausea, numbness, vomiting or weakness.  Pt states about a year ago, he fell off a treadmill during a cardiac stress test. States at that time injured his shoulder. States in the last 1-2 weeks, pain has been worsening. Today he is unable to raise his arm at shoulder joint at all. Denies fever, chills, new injury. Denies numnbness or weakness to the forearm, wrist, and hand. States he ran out of pain medications. Has not seen orthopedics specialist recently for this.   Past Medical History  Diagnosis Date  . Hypertension   . Chronic low back pain     right leg pain   . Hip pain     left  . Seizures   . ETOHism   . Cataract   . CAD (coronary artery disease)   . PVD (posterior vitreous detachment), left eye   . Anemia     CKD  . Colon polyps     Past Surgical History  Procedure Date  . Cataracts surgery     History reviewed. No pertinent family history.  History  Substance Use Topics  . Smoking status: Not on file  . Smokeless tobacco: Not on file  . Alcohol Use: Yes      Review of Systems  Constitutional: Negative for fever, chills and diaphoresis.  HENT: Positive for neck pain.   Respiratory: Negative for cough, chest tightness, shortness of breath and  wheezing.   Cardiovascular: Negative for chest pain.  Gastrointestinal: Negative for nausea, vomiting and abdominal pain.  Genitourinary: Negative.   Musculoskeletal: Positive for myalgias and arthralgias.  Skin: Negative.   Neurological: Negative for weakness and numbness.  Psychiatric/Behavioral: Negative.     Allergies  Darvocet; Penicillins; and Sulfonamide derivatives  Home Medications   Current Outpatient Rx  Name Route Sig Dispense Refill  . AMLODIPINE BESYLATE 10 MG PO TABS Oral Take 10 mg by mouth daily.     . ATORVASTATIN CALCIUM 40 MG PO TABS Oral Take 40 mg by mouth at bedtime.     Marland Kitchen CALCITRIOL 0.25 MCG PO CAPS Oral Take 0.25 mcg by mouth daily.     Marland Kitchen CLOPIDOGREL BISULFATE 75 MG PO TABS Oral Take 75 mg by mouth daily.     Marland Kitchen DARBEPOETIN ALFA-POLYSORBATE 200 MCG/0.4ML IJ SOLN Subcutaneous Inject 200 mcg into the skin every 28 (twenty-eight) days. Next dose due Monday    . FERROUS SULFATE 325 (65 FE) MG PO TABS Oral Take 325 mg by mouth 2 (two) times daily.     Marland Kitchen MECLIZINE HCL 12.5 MG PO TABS Oral Take 12.5 mg by mouth 3 (three) times daily.     Marland Kitchen PHENYTOIN SODIUM EXTENDED 100 MG PO CAPS Oral Take 200 mg by mouth 2 (two) times daily.     . SODIUM BICARBONATE 650 MG PO TABS Oral Take 1,300 mg by mouth 2 (  two) times daily.       BP 155/81  Pulse 71  Temp(Src) 97.5 F (36.4 C) (Oral)  Resp 20  SpO2 98%  Physical Exam  Nursing note and vitals reviewed. Constitutional: He is oriented to person, place, and time. He appears well-developed and well-nourished.       Uncomfortable appearing  HENT:  Head: Normocephalic and atraumatic.  Eyes: Conjunctivae are normal.  Neck: Neck supple.  Cardiovascular: Normal rate, regular rhythm and normal heart sounds.   Pulmonary/Chest: Effort normal and breath sounds normal. No respiratory distress. He has no wheezes.  Abdominal: Soft. Bowel sounds are normal. There is no tenderness.  Musculoskeletal: He exhibits tenderness.        Left shoulder normal in appearance with no rash, swelling, erythema, bruising. Tender to palpation over entire joint. Pt unable to move left shoulder with any ROM due to pain. Normal left hand strength, ulnar, radial nerves intact. Normal sensation down left arm. Good capillary refill in left fingers, <2sec  Neurological: He is alert and oriented to person, place, and time.  Skin: Skin is warm and dry.  Psychiatric: He has a normal mood and affect.    ED Course  Procedures (including critical care time)  Pt with chronic left shoulder pain. Pt was seen here for the same in the past. Arthritis on x-ray, no acute findings. There are no signs of infection. I doubt this painis cardiac, it has been there for years, he has no chest pain, no SOB, and pain is worsened with any shoulder movement. I will d/c pt home with pain medications and orthopedics follow up. Pt was also seen by Dr. Preston Fleeting who agrees with the plan. Pt was given a sling and pain medications in ED>   No diagnosis found.    MDM          Lottie Mussel, PA 11/19/11 [phone]

## 2011-11-19 NOTE — ED Notes (Addendum)
Pt reports severe left sided shoulder pain from falling off a treadmill a year ago, states pain started yesterday and has progressively worsened. Denies chest pain or sob. Report took muscle relaxers this am with no relief. States he is unable to move his left arm without severe discomfort.

## 2011-11-19 NOTE — ED Provider Notes (Signed)
78 year old male has been having shoulder pain since he fell off of the treadmill 3 months ago. His pain in his left shoulder is more severe and worse if he raises it over his head. There is tenderness diffusely to the shoulder and x-ray shows significant arthritic changes. He likely has a rotator cuff injury. He will be given a sling for comfort and be treated symptomatically and is referred back to his orthopedic physician for physical therapy and consideration for surgical management if indicated.  Dione Booze, MD 11/19/11 1126

## 2011-11-19 NOTE — ED Notes (Signed)
States pain from falling off a treadmill a year ago

## 2011-11-19 NOTE — Discharge Instructions (Signed)
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 Keep sling on for at least a week. No lifting or using left arm. Take pain medications as prescribed as needed. Please follow up with orthopedics, either your doctor or as referred for further evaluation and treatment.   Shoulder Pain The shoulder is a ball and socket joint. The muscles and tendons (rotator cuff) are what keep the shoulder in its joint and stable. This collection of muscles and tendons holds in the head (ball) of the humerus (upper arm bone) in the fossa (cup) of the scapula (shoulder blade). Today no reason was found for your shoulder pain. Often pain in the shoulder may be treated conservatively with temporary immobilization. For example, holding the shoulder in one place using a sling for rest. Physical therapy may be needed if problems continue. HOME CARE INSTRUCTIONS   Apply ice to the sore area for 15 to 20 minutes, 3 to 4 times per day for the first 2 days. Put the ice in a plastic bag. Place a towel between the bag of ice and your skin.   If you have or were given a shoulder sling and straps, do not remove for as long as directed by your caregiver or until you see a caregiver for a follow-up examination. If you need to remove it to shower or bathe, move your arm as little as possible.   Sleep on several pillows at night to lessen swelling and pain.   Only take over-the-counter or prescription medicines for pain, discomfort, or fever as directed by your caregiver.   Keep any follow-up appointments in order to avoid any type of permanent shoulder disability or chronic pain problems.  SEEK MEDICAL CARE IF:   Pain in your shoulder increases or new pain develops in your arm, hand, or fingers.   Your hand or fingers are colder than your other hand.   You do not obtain pain relief with the medications or your pain becomes worse.  SEEK IMMEDIATE MEDICAL CARE IF:   Your arm, hand, or fingers are numb or tingling.   Your arm,  hand, or fingers are swollen, painful, or turn white or blue.   You develop chest pain or shortness of breath.  MAKE SURE YOU:   Understand these instructions.   Will watch your condition.   Will get help right away if you are not doing well or get worse.  Document Released: 05/28/2005 Document Revised: 08/07/2011 Document Reviewed: 08/02/2011 [facility] Patient Information 2012 [place], [place].

## 2011-11-19 NOTE — Progress Notes (Signed)
Orthopedic Tech Progress Note Patient Details:  Larry West 26-Nov-1925 [id number]  Type of Splint: Other (comment) Splint Location: left arm Splint Interventions: Application    Calyn Sivils T 11/19/2011, 11:21 AM

## 2011-11-20 NOTE — ED Provider Notes (Signed)
Medical screening examination/treatment/procedure(s) were performed by non-physician practitioner and as supervising physician I was immediately available for consultation/collaboration.   Oumou Smead A. Cortina Vultaggio, MD 11/20/11 1105 

## 2011-11-24 ENCOUNTER — Encounter (HOSPITAL_COMMUNITY)
Admission: RE | Admit: 2011-11-24 | Discharge: 2011-11-24 | Disposition: A | Discharge status: Home / Self Care | Payer: Medicare FFS | Source: Ambulatory Visit | Attending: Nephrology | Admitting: Nephrology

## 2011-11-24 DIAGNOSIS — D638 Anemia in other chronic diseases classified elsewhere: Secondary | ICD-10-CM | POA: Insufficient documentation

## 2011-11-24 DIAGNOSIS — N183 Chronic kidney disease, stage 3 unspecified: Secondary | ICD-10-CM | POA: Insufficient documentation

## 2011-11-24 LAB — RENAL FUNCTION PANEL
CO2: 23 mEq/L (ref 19–32)
Chloride: 103 mEq/L (ref 96–112)
GFR calc Af Amer: 27 mL/min — ABNORMAL LOW (ref >90)
GFR calc non Af Amer: 23 mL/min — ABNORMAL LOW (ref >90)
Sodium: 137 mEq/L (ref 135–145)

## 2011-11-24 MED ORDER — DARBEPOETIN ALFA-POLYSORBATE 200 MCG/0.4ML IJ SOLN
200.0000 ug | INTRAMUSCULAR | Status: DC
  Administered 2011-11-24: 200 ug via INTRAVENOUS

## 2011-11-24 MED ORDER — DARBEPOETIN ALFA-POLYSORBATE 200 MCG/0.4ML IJ SOLN
INTRAMUSCULAR | Status: AC
  Filled 2011-11-24: qty 0.4

## 2011-12-18 ENCOUNTER — Other Ambulatory Visit (HOSPITAL_COMMUNITY): Payer: Self-pay | Admitting: *Deleted

## 2011-12-22 ENCOUNTER — Encounter (HOSPITAL_COMMUNITY): Payer: Medicare FFS

## 2011-12-23 ENCOUNTER — Emergency Department (INDEPENDENT_AMBULATORY_CARE_PROVIDER_SITE_OTHER)
Admission: EM | Admit: 2011-12-23 | Discharge: 2011-12-23 | Disposition: A | Discharge status: Home / Self Care | Payer: Medicare FFS | Source: Home / Self Care | Attending: Emergency Medicine | Admitting: Emergency Medicine

## 2011-12-23 ENCOUNTER — Encounter (HOSPITAL_COMMUNITY): Payer: Self-pay | Admitting: *Deleted

## 2011-12-23 ENCOUNTER — Emergency Department (INDEPENDENT_AMBULATORY_CARE_PROVIDER_SITE_OTHER): Payer: Medicare FFS

## 2011-12-23 DIAGNOSIS — S0003XA Contusion of scalp, initial encounter: Secondary | ICD-10-CM

## 2011-12-23 DIAGNOSIS — S61409A Unspecified open wound of unspecified hand, initial encounter: Secondary | ICD-10-CM

## 2011-12-23 DIAGNOSIS — S0083XA Contusion of other part of head, initial encounter: Secondary | ICD-10-CM

## 2011-12-23 DIAGNOSIS — S61419A Laceration without foreign body of unspecified hand, initial encounter: Secondary | ICD-10-CM

## 2011-12-23 DIAGNOSIS — S1093XA Contusion of unspecified part of neck, initial encounter: Secondary | ICD-10-CM

## 2011-12-23 NOTE — ED Notes (Signed)
Last  Tet  Shot 06/25/2006  Per  [employer]  Family  practice

## 2011-12-23 NOTE — Discharge Instructions (Signed)
Return in 10 days for suture removal. If any increased tenderness swelling redness or signs of infection as discussed return sooner for recheck.

## 2011-12-23 NOTE — ED Provider Notes (Signed)
History     CSN: [id number]  Arrival date & time 12/23/11  1150   First MD Initiated Contact with Patient 12/23/11 1153      Chief Complaint  Patient presents with  . Fall    (Consider location/radiation/quality/duration/timing/severity/associated sxs/prior treatment) HPI Comments: Patient was walking and tripped over some some objects and fell on concrete surface cutting his right hand. Largest cut on his fifth finger the right hand, it left knee. Hit his face on the right side has some bruising and a cut to his upper lip.  Patient was walking and tripped no appreciable difference her symptoms patient did not lose consciousness and was completely aware of the event family member was with him when the incident occurred as they were working in her yard today. Patient said to 1 the blood the most was from his right hand and bled a little bit from his upper lip. Patient has some tenderness and swelling on his right side of his face (right zygomatic region).  Patient denies any dizziness, visual changes feeling nauseous or any abdominal pain. Patient has chronic shoulder problems with permanent nerve injuries as he is unable to feel his first second and third digit chronically.  Patient is a 78 y.o. male presenting with fall. The history is provided by the patient.  Fall The accident occurred 1 to 2 hours ago. The fall occurred while walking. He fell from a height of 1 to 2 ft. He landed on concrete. The point of impact was the head. The pain is at a severity of 4/10. The pain is mild. Associated symptoms include numbness. Pertinent negatives include no fever.    Past Medical History  Diagnosis Date  . Hypertension   . Chronic low back pain     right leg pain   . Hip pain     left  . Seizures   . ETOHism   . Cataract   . CAD (coronary artery disease)   . PVD (posterior vitreous detachment), left eye   . Anemia     CKD  . Colon polyps     Past Surgical History  Procedure Date    . Cataracts surgery     No family history on file.  History  Substance Use Topics  . Smoking status: Not on file  . Smokeless tobacco: Not on file  . Alcohol Use: Yes      Review of Systems  Constitutional: Negative for fever, diaphoresis and activity change.  HENT: Negative for facial swelling.   Eyes: Negative for itching.  Respiratory: Negative for cough and shortness of breath.   Genitourinary: Negative for enuresis.  Skin: Positive for wound. Negative for rash.  Neurological: Positive for numbness. Negative for dizziness, seizures, speech difficulty and weakness.    Allergies  Darvocet; Penicillins; and Sulfonamide derivatives  Home Medications   Current Outpatient Rx  Name Route Sig Dispense Refill  . AMLODIPINE BESYLATE 10 MG PO TABS Oral Take 10 mg by mouth daily.     . ATORVASTATIN CALCIUM 40 MG PO TABS Oral Take 40 mg by mouth at bedtime.     Marland Kitchen CALCITRIOL 0.25 MCG PO CAPS Oral Take 0.25 mcg by mouth daily.     Marland Kitchen CLOPIDOGREL BISULFATE 75 MG PO TABS Oral Take 75 mg by mouth daily.     Marland Kitchen DARBEPOETIN ALFA-POLYSORBATE 200 MCG/0.4ML IJ SOLN Subcutaneous Inject 200 mcg into the skin every 28 (twenty-eight) days. Next dose due Monday    . FERROUS SULFATE 325 (65  FE) MG PO TABS Oral Take 325 mg by mouth 2 (two) times daily.     Marland Kitchen MECLIZINE HCL 12.5 MG PO TABS Oral Take 12.5 mg by mouth 3 (three) times daily.     Marland Kitchen PHENYTOIN SODIUM EXTENDED 100 MG PO CAPS Oral Take 200 mg by mouth 2 (two) times daily.     . SODIUM BICARBONATE 650 MG PO TABS Oral Take 1,300 mg by mouth 2 (two) times daily.       BP 180/44  Pulse 62  Temp(Src) 98 F (36.7 C) (Oral)  Resp 18  SpO2 99%  Physical Exam  Nursing note and vitals reviewed. Constitutional: He appears well-developed and well-nourished. No distress.  Neck: Neck supple.  Musculoskeletal: He exhibits tenderness.  Neurological: He is alert.  Skin: Skin is warm.       ED Course  LACERATION REPAIR Performed by: Charell Faulk,  Moriyah Byington Authorized by: Jimmie Molly Consent: Verbal consent obtained. Written consent obtained. Risks and benefits: risks, benefits and alternatives were discussed Consent given by: patient and guardian Patient understanding: patient states understanding of the procedure being performed Patient identity confirmed: verbally with patient Body area: upper extremity Location details: right small finger Laceration length: 4 cm Foreign bodies: no foreign bodies Tendon involvement: none Nerve involvement: none Vascular damage: no Local anesthetic: lidocaine 1% without epinephrine Anesthetic total: 5 ml Patient sedated: no Irrigation solution: tap water Amount of cleaning: extensive Debridement: minimal Degree of undermining: minimal Skin closure: 3-0 Prolene Number of sutures: 5 Technique: simple and horizontal mattress Approximation: loose Approximation difficulty: simple Patient tolerance: Patient tolerated the procedure well with no immediate complications.   (including critical care time)  Labs Reviewed - No data to display No results found.   No diagnosis found.    MDM  Patient sustained a fall at home with multiple abrasions and contusions and a repaired right hand laceration with a facial contusion. No suspicious for orbital fracture.        Jimmie Molly, MD 12/23/11 1306

## 2011-12-23 NOTE — ED Notes (Signed)
Pt   Reports       He felled      And  inj        His      l  Knee       And       r  Hand         He did  Not  Black out  And    He  Is  Now         Awake  And  Alert      He  Is  Ambulatory    Family  Member  With  Pt  Pt  Has  Some  Swelling  To l  Knee  As  well

## 2012-01-02 ENCOUNTER — Encounter (HOSPITAL_COMMUNITY): Payer: Self-pay | Admitting: *Deleted

## 2012-01-02 ENCOUNTER — Emergency Department (INDEPENDENT_AMBULATORY_CARE_PROVIDER_SITE_OTHER)
Admission: EM | Admit: 2012-01-02 | Discharge: 2012-01-02 | Disposition: A | Discharge status: Home / Self Care | Payer: Medicare HMO | Source: Home / Self Care | Attending: Family Medicine | Admitting: Family Medicine

## 2012-01-02 DIAGNOSIS — Z4802 Encounter for removal of sutures: Secondary | ICD-10-CM

## 2012-01-02 NOTE — Discharge Instructions (Signed)
Return as needed

## 2012-01-02 NOTE — ED Notes (Signed)
Pt  Here  For  Suture  Removal r  Hand  Sutures  Have  Been in place  For  10  Days   Appears  Well healing

## 2012-01-02 NOTE — ED Provider Notes (Signed)
History     CSN: [id number]  Arrival date & time 01/02/12  1413   First MD Initiated Contact with Patient 01/02/12 1137      Chief Complaint  Patient presents with  . Suture / Staple Removal    (Consider location/radiation/quality/duration/timing/severity/associated sxs/prior treatment) Patient is a 78 y.o. male presenting with suture removal. The history is provided by the patient.  Suture / Staple Removal  The sutures were placed 7 to 10 days ago. There has been no treatment since the wound repair. His temperature was unmeasured prior to arrival. There has been no drainage from the wound. There is no redness present. There is no swelling present. The pain has no pain. He has no difficulty moving the affected extremity or digit.    Past Medical History  Diagnosis Date  . Hypertension   . Chronic low back pain     right leg pain   . Hip pain     left  . Seizures   . ETOHism   . Cataract   . CAD (coronary artery disease)   . PVD (posterior vitreous detachment), left eye   . Anemia     CKD  . Colon polyps     Past Surgical History  Procedure Date  . Cataracts surgery     No family history on file.  History  Substance Use Topics  . Smoking status: Not on file  . Smokeless tobacco: Not on file  . Alcohol Use: Yes      Review of Systems  Constitutional: Negative.     Allergies  Darvocet; Penicillins; and Sulfonamide derivatives  Home Medications   Current Outpatient Rx  Name Route Sig Dispense Refill  . AMLODIPINE BESYLATE 10 MG PO TABS Oral Take 10 mg by mouth daily.     . ATORVASTATIN CALCIUM 40 MG PO TABS Oral Take 40 mg by mouth at bedtime.     Marland Kitchen CALCITRIOL 0.25 MCG PO CAPS Oral Take 0.25 mcg by mouth daily.     Marland Kitchen CLOPIDOGREL BISULFATE 75 MG PO TABS Oral Take 75 mg by mouth daily.     Marland Kitchen DARBEPOETIN ALFA-POLYSORBATE 200 MCG/0.4ML IJ SOLN Subcutaneous Inject 200 mcg into the skin every 28 (twenty-eight) days. Next dose due Monday    . FERROUS SULFATE  325 (65 FE) MG PO TABS Oral Take 325 mg by mouth 2 (two) times daily.     Marland Kitchen MECLIZINE HCL 12.5 MG PO TABS Oral Take 12.5 mg by mouth 3 (three) times daily.     Marland Kitchen PHENYTOIN SODIUM EXTENDED 100 MG PO CAPS Oral Take 200 mg by mouth 2 (two) times daily.     . SODIUM BICARBONATE 650 MG PO TABS Oral Take 1,300 mg by mouth 2 (two) times daily.       BP 175/72  Pulse 66  Temp(Src) 97.4 F (36.3 C) (Oral)  Resp 20  SpO2 100%  Physical Exam  Nursing note and vitals reviewed. Constitutional: He appears well-developed and well-nourished.  HENT:  Head: Normocephalic.  Skin: Skin is warm and dry.       Facial abrasion healed, right hand lac healed, no infection sutures removed, dsd,    ED Course  Procedures (including critical care time)  Labs Reviewed - No data to display No results found.   1. Visit for suture removal       MDM  Sutures removed.        Linna Hoff, MD 01/02/12 1440

## 2012-01-15 ENCOUNTER — Telehealth: Payer: Self-pay | Admitting: Oncology

## 2012-01-15 NOTE — Telephone Encounter (Signed)
Del.01/15/12 Ref.Janene Kadarrius Dx. Anemia

## 2012-01-15 NOTE — Telephone Encounter (Signed)
Pt called back this morning and confirmed 5/30 appt w/HH @ 10am. Pt given d/t/location and has number already.

## 2012-01-21 ENCOUNTER — Encounter: Payer: Self-pay | Admitting: Oncology

## 2012-01-21 DIAGNOSIS — D638 Anemia in other chronic diseases classified elsewhere: Secondary | ICD-10-CM | POA: Insufficient documentation

## 2012-01-28 NOTE — Progress Notes (Signed)
Called patient to to remind him of appointment tomorrow (01/29/12); gave patient directions to [facility]; patient verbalized understanding.

## 2012-01-28 NOTE — Patient Instructions (Addendum)
A.  Issues:  Anemia from most likely anemia of chronic kidney disease.  B.  Work up:  Blood tests to rule out other reversible causes of anemia.   C.  Treatment of  anemia of chronic kidney disease:  Observation with blood check every 2 weeks and transfusion for Hgb <7.  In the future, if you require frequent blood transfusion, we may consider injection of Aranesp (a hormone) to stimulate Hgb.  There is a slight increased risk of stroke, heart attack with Aranesp.  Therefore, I only recommend it when patients require frequent blood transfusion.

## 2012-01-29 ENCOUNTER — Other Ambulatory Visit (HOSPITAL_BASED_OUTPATIENT_CLINIC_OR_DEPARTMENT_OTHER): Payer: Medicare FFS | Admitting: Lab

## 2012-01-29 ENCOUNTER — Telehealth: Payer: Self-pay | Admitting: Oncology

## 2012-01-29 ENCOUNTER — Encounter: Payer: Self-pay | Admitting: Oncology

## 2012-01-29 ENCOUNTER — Other Ambulatory Visit: Payer: Self-pay | Admitting: Oncology

## 2012-01-29 ENCOUNTER — Ambulatory Visit: Payer: Medicare FFS

## 2012-01-29 ENCOUNTER — Ambulatory Visit (HOSPITAL_BASED_OUTPATIENT_CLINIC_OR_DEPARTMENT_OTHER): Payer: Medicare FFS | Admitting: Oncology

## 2012-01-29 VITALS — BP 187/86 | HR 62 | Temp 97.1°F | Ht 66.0 in | Wt 168.7 lb

## 2012-01-29 DIAGNOSIS — R569 Unspecified convulsions: Secondary | ICD-10-CM

## 2012-01-29 DIAGNOSIS — D539 Nutritional anemia, unspecified: Secondary | ICD-10-CM

## 2012-01-29 DIAGNOSIS — D649 Anemia, unspecified: Secondary | ICD-10-CM

## 2012-01-29 DIAGNOSIS — N183 Chronic kidney disease, stage 3 unspecified: Secondary | ICD-10-CM

## 2012-01-29 DIAGNOSIS — M545 Low back pain, unspecified: Secondary | ICD-10-CM

## 2012-01-29 DIAGNOSIS — Z860100 Personal history of colon polyps, unspecified: Secondary | ICD-10-CM

## 2012-01-29 DIAGNOSIS — I739 Peripheral vascular disease, unspecified: Secondary | ICD-10-CM

## 2012-01-29 DIAGNOSIS — F1020 Alcohol dependence, uncomplicated: Secondary | ICD-10-CM

## 2012-01-29 DIAGNOSIS — I10 Essential (primary) hypertension: Secondary | ICD-10-CM

## 2012-01-29 DIAGNOSIS — G8929 Other chronic pain: Secondary | ICD-10-CM

## 2012-01-29 DIAGNOSIS — D631 Anemia in chronic kidney disease: Secondary | ICD-10-CM

## 2012-01-29 LAB — CBC WITH DIFFERENTIAL/PLATELET
Basophils Absolute: 0 10*3/uL (ref 0.0–0.1)
Eosinophils Absolute: 0.1 10*3/uL (ref 0.0–0.5)
HGB: 8.6 g/dL — ABNORMAL LOW (ref 13.0–17.1)
MCV: 110 fL — ABNORMAL HIGH (ref 79.3–98.0)
MONO#: 0.5 10*3/uL (ref 0.1–0.9)
MONO%: 9.9 % (ref 0.0–14.0)
NEUT#: 3 10*3/uL (ref 1.5–6.5)
RBC: 2.35 10*6/uL — ABNORMAL LOW (ref 4.20–5.82)
RDW: 15.5 % — ABNORMAL HIGH (ref 11.0–14.6)
WBC: 4.7 10*3/uL (ref 4.0–10.3)
lymph#: 1.1 10*3/uL (ref 0.9–3.3)
nRBC: 0 % (ref 0–0)

## 2012-01-29 MED ORDER — DARBEPOETIN ALFA-POLYSORBATE 300 MCG/0.6ML IJ SOLN
300.0000 ug | Freq: Once | INTRAMUSCULAR | Status: AC
  Administered 2012-01-29: 300 ug via SUBCUTANEOUS
  Filled 2012-01-29: qty 0.6

## 2012-01-29 NOTE — Progress Notes (Signed)
New patient today, patient was alone, patient has insurance, patient did voice some concerns about son Dayshon Roback. Patient stated that he lives in a barn and his son was getting him evicted out of the barn, He did not know where he would go. He can't go stay with his son Harvie Heck because his daughter lives with him they don't have room. His sister is still living but he can't go stay with her either. Lauren came down with some concerns that Dr. Gaylyn Rong had voice, I called the patients son left message for son to give me a call back, We are going to apply for medicaid for the patient wanted to discuss some things over with patients son.

## 2012-01-29 NOTE — Telephone Encounter (Signed)
Gv pt appt for [id number]

## 2012-01-29 NOTE — Telephone Encounter (Signed)
Called son left son a message to call me back as soon as possible concerning patients living situation.

## 2012-01-30 ENCOUNTER — Encounter: Payer: Self-pay | Admitting: Oncology
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
## 2012-02-02 LAB — PROTEIN ELECTROPHORESIS, SERUM
Albumin ELP: 47.8 % — ABNORMAL LOW (ref 55.8–66.1)
Beta 2: 9.4 % — ABNORMAL HIGH (ref 3.2–6.5)
Beta Globulin: 6.1 % (ref 4.7–7.2)
Gamma Globulin: 14.1 % (ref 11.1–18.8)

## 2012-02-02 LAB — ERYTHROPOIETIN: Erythropoietin: 46.9 m[IU]/mL — ABNORMAL HIGH (ref 2.6–34.0)

## 2012-02-02 LAB — FERRITIN: Ferritin: 244 ng/mL (ref 22–322)

## 2012-02-02 LAB — IRON AND TIBC
TIBC: 231 ug/dL (ref 215–435)
UIBC: 129 ug/dL (ref 125–400)

## 2012-02-02 LAB — KAPPA/LAMBDA LIGHT CHAINS: Kappa:Lambda Ratio: 0.91 (ref 0.26–1.65)

## 2012-02-03 ENCOUNTER — Encounter: Payer: Self-pay | Admitting: Oncology

## 2012-02-03 NOTE — Progress Notes (Signed)
Patient came by today with son and grandaughter helped patient with filling out medicaid application, also gave patient EPP application, they will gather all information needed and bring back to me to complete medicaid application.

## 2012-02-10 ENCOUNTER — Encounter: Payer: Self-pay | Admitting: Oncology

## 2012-02-10 NOTE — Progress Notes (Signed)
Patient came by with son made copies of information needed to send in with medicaid application. They were going to take the application to social service department and drop off.

## 2012-02-10 NOTE — Progress Notes (Signed)
Patient also brought back epp application for financial assistance.

## 2012-02-26 ENCOUNTER — Ambulatory Visit (HOSPITAL_BASED_OUTPATIENT_CLINIC_OR_DEPARTMENT_OTHER): Payer: Medicare FFS

## 2012-02-26 ENCOUNTER — Other Ambulatory Visit: Payer: Self-pay | Admitting: Oncology

## 2012-02-26 ENCOUNTER — Other Ambulatory Visit: Payer: Medicare FFS

## 2012-02-26 VITALS — BP 171/83 | HR 62 | Temp 96.9°F

## 2012-02-26 DIAGNOSIS — R569 Unspecified convulsions: Secondary | ICD-10-CM

## 2012-02-26 DIAGNOSIS — D649 Anemia, unspecified: Secondary | ICD-10-CM

## 2012-02-26 DIAGNOSIS — N183 Chronic kidney disease, stage 3 unspecified: Secondary | ICD-10-CM

## 2012-02-26 DIAGNOSIS — I10 Essential (primary) hypertension: Secondary | ICD-10-CM

## 2012-02-26 DIAGNOSIS — F1020 Alcohol dependence, uncomplicated: Secondary | ICD-10-CM

## 2012-02-26 DIAGNOSIS — G8929 Other chronic pain: Secondary | ICD-10-CM

## 2012-02-26 DIAGNOSIS — D631 Anemia in chronic kidney disease: Secondary | ICD-10-CM

## 2012-02-26 DIAGNOSIS — Z86010 Personal history of colonic polyps: Secondary | ICD-10-CM

## 2012-02-26 DIAGNOSIS — M545 Low back pain: Secondary | ICD-10-CM

## 2012-02-26 DIAGNOSIS — I739 Peripheral vascular disease, unspecified: Secondary | ICD-10-CM

## 2012-02-26 DIAGNOSIS — D539 Nutritional anemia, unspecified: Secondary | ICD-10-CM

## 2012-02-26 LAB — CBC WITH DIFFERENTIAL/PLATELET
Eosinophils Absolute: 0.1 10*3/uL (ref 0.0–0.5)
HGB: 9 g/dL — ABNORMAL LOW (ref 13.0–17.1)
MONO#: 0.4 10*3/uL (ref 0.1–0.9)
NEUT#: 2.9 10*3/uL (ref 1.5–6.5)
Platelets: 273 10*3/uL (ref 140–400)
RBC: 2.45 10*6/uL — ABNORMAL LOW (ref 4.20–5.82)
RDW: 17.3 % — ABNORMAL HIGH (ref 11.0–14.6)
WBC: 4.9 10*3/uL (ref 4.0–10.3)

## 2012-02-26 MED ORDER — DARBEPOETIN ALFA-POLYSORBATE 300 MCG/0.6ML IJ SOLN
300.0000 ug | Freq: Once | INTRAMUSCULAR | Status: AC
  Administered 2012-02-26: 300 ug via SUBCUTANEOUS

## 2012-03-01 ENCOUNTER — Telehealth: Payer: Self-pay | Admitting: Oncology

## 2012-03-01 NOTE — Telephone Encounter (Signed)
Called patient to inform him of letter received from his case worker for more information to get medicaid.  Patient was asked to call case worker. Informed patient that if he gets the information requested he can bring in to me and I will fax for him.

## 2012-03-09 ENCOUNTER — Encounter: Payer: Self-pay | Admitting: Oncology
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
## 2012-03-11 ENCOUNTER — Encounter: Payer: Self-pay | Admitting: Oncology

## 2012-03-11 NOTE — Progress Notes (Signed)
Patient approved for 100% discount for family of 1 income is 15156.00 03/11/12 to 09/11/12

## 2012-03-17 ENCOUNTER — Telehealth: Payer: Self-pay | Admitting: Oncology

## 2012-03-17 NOTE — Telephone Encounter (Signed)
Patient called to let me know that he had gotten medicaid card.

## 2012-03-25 ENCOUNTER — Other Ambulatory Visit (HOSPITAL_BASED_OUTPATIENT_CLINIC_OR_DEPARTMENT_OTHER): Payer: Medicare FFS | Admitting: Lab

## 2012-03-25 ENCOUNTER — Ambulatory Visit (HOSPITAL_BASED_OUTPATIENT_CLINIC_OR_DEPARTMENT_OTHER): Payer: Medicare FFS

## 2012-03-25 VITALS — BP 174/81 | HR 75 | Temp 96.2°F

## 2012-03-25 DIAGNOSIS — D649 Anemia, unspecified: Secondary | ICD-10-CM

## 2012-03-25 DIAGNOSIS — D631 Anemia in chronic kidney disease: Secondary | ICD-10-CM

## 2012-03-25 DIAGNOSIS — R569 Unspecified convulsions: Secondary | ICD-10-CM

## 2012-03-25 DIAGNOSIS — G8929 Other chronic pain: Secondary | ICD-10-CM

## 2012-03-25 DIAGNOSIS — N183 Chronic kidney disease, stage 3 unspecified: Secondary | ICD-10-CM

## 2012-03-25 DIAGNOSIS — F1020 Alcohol dependence, uncomplicated: Secondary | ICD-10-CM

## 2012-03-25 DIAGNOSIS — Z86010 Personal history of colonic polyps: Secondary | ICD-10-CM

## 2012-03-25 DIAGNOSIS — I739 Peripheral vascular disease, unspecified: Secondary | ICD-10-CM

## 2012-03-25 DIAGNOSIS — I10 Essential (primary) hypertension: Secondary | ICD-10-CM

## 2012-03-25 DIAGNOSIS — M545 Low back pain: Secondary | ICD-10-CM

## 2012-03-25 LAB — CBC WITH DIFFERENTIAL/PLATELET
Basophils Absolute: 0.1 10*3/uL (ref 0.0–0.1)
Eosinophils Absolute: 0 10*3/uL (ref 0.0–0.5)
HCT: 29.5 % — ABNORMAL LOW (ref 38.4–49.9)
LYMPH%: 22.2 % (ref 14.0–49.0)
MONO#: 0.5 10*3/uL (ref 0.1–0.9)
NEUT#: 3.7 10*3/uL (ref 1.5–6.5)
NEUT%: 66.9 % (ref 39.0–75.0)
Platelets: 208 10*3/uL (ref 140–400)
WBC: 5.6 10*3/uL (ref 4.0–10.3)

## 2012-03-25 MED ORDER — DARBEPOETIN ALFA-POLYSORBATE 300 MCG/0.6ML IJ SOLN
300.0000 ug | Freq: Once | INTRAMUSCULAR | Status: AC
  Administered 2012-03-25: 300 ug via SUBCUTANEOUS
  Filled 2012-03-25: qty 0.6

## 2012-04-22 ENCOUNTER — Other Ambulatory Visit (HOSPITAL_BASED_OUTPATIENT_CLINIC_OR_DEPARTMENT_OTHER): Payer: Medicare FFS | Admitting: Lab

## 2012-04-22 ENCOUNTER — Ambulatory Visit (HOSPITAL_BASED_OUTPATIENT_CLINIC_OR_DEPARTMENT_OTHER): Payer: Medicare FFS

## 2012-04-22 VITALS — BP 192/89 | HR 62 | Temp 97.1°F

## 2012-04-22 DIAGNOSIS — I10 Essential (primary) hypertension: Secondary | ICD-10-CM

## 2012-04-22 DIAGNOSIS — D631 Anemia in chronic kidney disease: Secondary | ICD-10-CM

## 2012-04-22 DIAGNOSIS — G8929 Other chronic pain: Secondary | ICD-10-CM

## 2012-04-22 DIAGNOSIS — N183 Chronic kidney disease, stage 3 unspecified: Secondary | ICD-10-CM

## 2012-04-22 DIAGNOSIS — R569 Unspecified convulsions: Secondary | ICD-10-CM

## 2012-04-22 DIAGNOSIS — D649 Anemia, unspecified: Secondary | ICD-10-CM

## 2012-04-22 DIAGNOSIS — F1020 Alcohol dependence, uncomplicated: Secondary | ICD-10-CM

## 2012-04-22 DIAGNOSIS — Z86010 Personal history of colonic polyps: Secondary | ICD-10-CM

## 2012-04-22 DIAGNOSIS — I739 Peripheral vascular disease, unspecified: Secondary | ICD-10-CM

## 2012-04-22 LAB — CBC WITH DIFFERENTIAL/PLATELET
Basophils Absolute: 0.1 10*3/uL (ref 0.0–0.1)
Eosinophils Absolute: 0.1 10*3/uL (ref 0.0–0.5)
HGB: 8.8 g/dL — ABNORMAL LOW (ref 13.0–17.1)
LYMPH%: 28.4 % (ref 14.0–49.0)
MCV: 116.9 fL — ABNORMAL HIGH (ref 79.3–98.0)
MONO#: 0.4 10*3/uL (ref 0.1–0.9)
MONO%: 8.4 % (ref 0.0–14.0)
NEUT#: 2.8 10*3/uL (ref 1.5–6.5)
Platelets: 175 10*3/uL (ref 140–400)
RBC: 2.23 10*6/uL — ABNORMAL LOW (ref 4.20–5.82)
WBC: 4.7 10*3/uL (ref 4.0–10.3)

## 2012-04-22 MED ORDER — DARBEPOETIN ALFA-POLYSORBATE 300 MCG/0.6ML IJ SOLN
300.0000 ug | Freq: Once | INTRAMUSCULAR | Status: AC
  Administered 2012-04-22: 300 ug via SUBCUTANEOUS
  Filled 2012-04-22: qty 0.6

## 2012-04-22 NOTE — Progress Notes (Signed)
Patient here for Aranesp injection   BP 192/89-62.  Dr Gaylyn Rong notified of this  [place] Spaniel OK to give injection.  Told to contact primary care physician about BP.

## 2012-05-11 ENCOUNTER — Other Ambulatory Visit: Payer: Self-pay | Admitting: Oncology

## 2012-05-13 ENCOUNTER — Telehealth: Payer: Self-pay | Admitting: *Deleted

## 2012-05-13 NOTE — Telephone Encounter (Signed)
Add inection appt to lab and visit on 9/19

## 2012-05-16 ENCOUNTER — Other Ambulatory Visit: Payer: Self-pay | Admitting: Oncology

## 2012-05-20 ENCOUNTER — Telehealth: Payer: Self-pay | Admitting: Oncology

## 2012-05-20 ENCOUNTER — Ambulatory Visit (HOSPITAL_BASED_OUTPATIENT_CLINIC_OR_DEPARTMENT_OTHER): Payer: Medicare FFS

## 2012-05-20 ENCOUNTER — Ambulatory Visit: Payer: Medicare FFS

## 2012-05-20 ENCOUNTER — Ambulatory Visit (HOSPITAL_BASED_OUTPATIENT_CLINIC_OR_DEPARTMENT_OTHER): Payer: Medicare FFS | Admitting: Oncology

## 2012-05-20 ENCOUNTER — Encounter: Payer: Self-pay | Admitting: Oncology

## 2012-05-20 ENCOUNTER — Other Ambulatory Visit (HOSPITAL_BASED_OUTPATIENT_CLINIC_OR_DEPARTMENT_OTHER): Payer: Medicare FFS

## 2012-05-20 VITALS — BP 174/77 | HR 58 | Temp 96.7°F | Resp 20 | Ht 66.0 in | Wt 156.4 lb

## 2012-05-20 DIAGNOSIS — D649 Anemia, unspecified: Secondary | ICD-10-CM

## 2012-05-20 DIAGNOSIS — F1020 Alcohol dependence, uncomplicated: Secondary | ICD-10-CM

## 2012-05-20 DIAGNOSIS — Z86010 Personal history of colonic polyps: Secondary | ICD-10-CM

## 2012-05-20 DIAGNOSIS — N183 Chronic kidney disease, stage 3 unspecified: Secondary | ICD-10-CM

## 2012-05-20 DIAGNOSIS — N039 Chronic nephritic syndrome with unspecified morphologic changes: Secondary | ICD-10-CM

## 2012-05-20 DIAGNOSIS — I739 Peripheral vascular disease, unspecified: Secondary | ICD-10-CM

## 2012-05-20 DIAGNOSIS — I129 Hypertensive chronic kidney disease with stage 1 through stage 4 chronic kidney disease, or unspecified chronic kidney disease: Secondary | ICD-10-CM

## 2012-05-20 DIAGNOSIS — D631 Anemia in chronic kidney disease: Secondary | ICD-10-CM

## 2012-05-20 DIAGNOSIS — G8929 Other chronic pain: Secondary | ICD-10-CM

## 2012-05-20 DIAGNOSIS — D539 Nutritional anemia, unspecified: Secondary | ICD-10-CM

## 2012-05-20 DIAGNOSIS — I10 Essential (primary) hypertension: Secondary | ICD-10-CM

## 2012-05-20 DIAGNOSIS — G40909 Epilepsy, unspecified, not intractable, without status epilepticus: Secondary | ICD-10-CM

## 2012-05-20 DIAGNOSIS — R569 Unspecified convulsions: Secondary | ICD-10-CM

## 2012-05-20 LAB — COMPREHENSIVE METABOLIC PANEL (CC13)
Alkaline Phosphatase: 155 U/L — ABNORMAL HIGH (ref 40–150)
CO2: 17 mEq/L — ABNORMAL LOW (ref 22–29)
Creatinine: 2 mg/dL — ABNORMAL HIGH (ref 0.7–1.3)
Glucose: 96 mg/dl (ref 70–99)
Total Bilirubin: 0.3 mg/dL (ref 0.20–1.20)

## 2012-05-20 LAB — CBC WITH DIFFERENTIAL/PLATELET
Eosinophils Absolute: 0.1 10*3/uL (ref 0.0–0.5)
HCT: 28.8 % — ABNORMAL LOW (ref 38.4–49.9)
LYMPH%: 30.5 % (ref 14.0–49.0)
MCHC: 33.7 g/dL (ref 32.0–36.0)
MCV: 115.6 fL — ABNORMAL HIGH (ref 79.3–98.0)
MONO#: 0.5 10*3/uL (ref 0.1–0.9)
MONO%: 12.6 % (ref 0.0–14.0)
NEUT#: 2.3 10*3/uL (ref 1.5–6.5)
NEUT%: 53.7 % (ref 39.0–75.0)
Platelets: 157 10*3/uL (ref 140–400)
WBC: 4.3 10*3/uL (ref 4.0–10.3)

## 2012-05-20 MED ORDER — DARBEPOETIN ALFA-POLYSORBATE 300 MCG/0.6ML IJ SOLN
300.0000 ug | Freq: Once | INTRAMUSCULAR | Status: AC
  Administered 2012-05-20: 300 ug via SUBCUTANEOUS
  Filled 2012-05-20: qty 0.6

## 2012-05-20 NOTE — Patient Instructions (Addendum)
Continue to come to our office to have your labs checked and get an Aranesp injection if you Hemoglobin is <11 every 4 weeks. We will see you for a visit in about 4-5 months.

## 2012-05-20 NOTE — Telephone Encounter (Signed)
Printed and made appt for pt. °

## 2012-05-20 NOTE — Patient Instructions (Signed)
DARBEPOETIN ALFA (dar be POE e tin AL fa) helps your body make more red blood cells. It is used to treat anemia caused by chronic kidney failure and chemotherapy. This medicine may be used for other purposes; ask your health care provider or pharmacist if you have questions. What should I tell my health care provider before I take this medicine? They need to know if you have any of these conditions: -blood clotting disorders or history of blood clots -cancer patient not on chemotherapy -cystic fibrosis -heart disease, such as angina, heart failure, or a history of a heart attack -hemoglobin level of 12 g/dL or greater -high blood pressure -low levels of folate, iron, or vitamin B12 -seizures -an unusual or allergic reaction to darbepoetin, erythropoietin, albumin, hamster proteins, latex, other medicines, foods, dyes, or preservatives -pregnant or trying to get pregnant -breast-feeding How should I use this medicine? This medicine is for injection into a vein or under the skin. It is usually given by a health care professional in a hospital or clinic setting. If you get this medicine at home, you will be taught how to prepare and give this medicine. Do not shake the solution before you withdraw a dose. Use exactly as directed. Take your medicine at regular intervals. Do not take your medicine more often than directed. It is important that you put your used needles and syringes in a special sharps container. Do not put them in a trash can. If you do not have a sharps container, call your pharmacist or healthcare provider to get one. Talk to your pediatrician regarding the use of this medicine in children. While this medicine may be used in children as young as 1 year for selected conditions, precautions do apply. Overdosage: If you think you have taken too much of this medicine contact a poison control center or emergency room at once. NOTE: This medicine is only for you. Do not share this  medicine with others. What if I miss a dose? If you miss a dose, take it as soon as you can. If it is almost time for your next dose, take only that dose. Do not take double or extra doses. What may interact with this medicine? Do not take this medicine with any of the following medications: -epoetin alfa This list may not describe all possible interactions. Give your health care provider a list of all the medicines, herbs, non-prescription drugs, or dietary supplements you use. Also tell them if you smoke, drink alcohol, or use illegal drugs. Some items may interact with your medicine. What should I watch for while using this medicine? Visit your prescriber or health care professional for regular checks on your progress and for the needed blood tests and blood pressure measurements. It is especially important for the doctor to make sure your hemoglobin level is in the desired range, to limit the risk of potential side effects and to give you the best benefit. Keep all appointments for any recommended tests. Check your blood pressure as directed. Ask your doctor what your blood pressure should be and when you should contact him or her. As your body makes more red blood cells, you may need to take iron, folic acid, or vitamin B supplements. Ask your doctor or health care provider which products are right for you. If you have kidney disease continue dietary restrictions, even though this medication can make you feel better. Talk with your doctor or health care professional about the foods you eat and the vitamins that   you take. What side effects may I notice from receiving this medicine? Side effects that you should report to your doctor or health care professional as soon as possible: -allergic reactions like skin rash, itching or hives, swelling of the face, lips, or tongue -breathing problems -changes in vision -chest pain -confusion, trouble speaking or understanding -feeling faint or lightheaded,  falls -high blood pressure -muscle aches or pains -pain, swelling, warmth in the leg -rapid weight gain -severe headaches -sudden numbness or weakness of the face, arm or leg -trouble walking, dizziness, loss of balance or coordination -seizures (convulsions) -swelling of the ankles, feet, hands -unusually weak or tired Side effects that usually do not require medical attention (report to your doctor or health care professional if they continue or are bothersome): -diarrhea -fever, chills (flu-like symptoms) -headaches -nausea, vomiting -redness, stinging, or swelling at site where injected This list may not describe all possible side effects. Call your doctor for medical advice about side effects. You may report side effects to FDA at 1-800-FDA-1088. Where should I keep my medicine? Keep out of the reach of children. Store in a refrigerator between 2 and 8 degrees C (36 and 46 degrees F). Do not freeze. Do not shake. Throw away any unused portion if using a single-dose vial. Throw away any unused medicine after the expiration date. NOTE: This sheet is a summary. It may not cover all possible information. If you have questions about this medicine, talk to your doctor, pharmacist, or health care provider.  2012, [employer]. (08/01/2008 10:23:57 AM) 

## 2012-05-20 NOTE — Progress Notes (Signed)
[facility]  Telephone:([phone]) [phone] Fax:(336) [phone]   OFFICE PROGRESS NOTE   Cc:  PICKARD,[name], MD  DIAGNOSIS: Anemia due to renal insufficiency  CURRENT THERAPY: Aranesp 300 mcg every 4 weeks for hemoglobin less than 11  INTERVAL HISTORY: Larry West 78 y.o. male returns for routine followup for his anemia. Patient reports he has had no new problems. He continues to tolerate the Aranesp injections well over all. He has not noticed any side effects. Fatigue is about the same. He denies any hemoptysis, hematemesis, hematuria, melena. Denies chest pain, shortness of breath, abdominal pain, nausea, vomiting. The patient states that he has been taken off of his blood pressure medication recently. He has not been seen back by his primary care provider for further followup.  Past Medical History  Diagnosis Date  . Hypertension   . Chronic low back pain     right leg pain   . Hip pain     left  . Seizures 2006  . ETOHism   . Cataract   . CAD (coronary artery disease) 2001    s/p PTCI  . PVD (posterior vitreous detachment), left eye   . Anemia     CKD  . Colon polyps   . Chronic kidney disease (CKD), stage III (moderate)   . Hyperlipidemia   . Hyperkalemia     Past Surgical History  Procedure Date  . Cataracts surgery   . Appendectomy 1955  . Gastric outlet obstruction surgery 1988  . Lumbar disc surgery     Current Outpatient Prescriptions  Medication Sig Dispense Refill  . amLODipine (NORVASC) 10 MG tablet Take 10 mg by mouth daily.       Marland Kitchen atorvastatin (LIPITOR) 40 MG tablet Take 40 mg by mouth at bedtime.       . calcitRIOL (ROCALTROL) 0.25 MCG capsule Take 0.25 mcg by mouth daily.       . clopidogrel (PLAVIX) 75 MG tablet Take 75 mg by mouth daily.       . darbepoetin (ARANESP) 200 MCG/0.4ML SOLN Inject 200 mcg into the skin every 28 (twenty-eight) days. Next dose due Monday      . diazepam (VALIUM) 5 MG tablet Take 5 mg by mouth. prn       . ferrous sulfate 325 (65 FE) MG tablet Take 325 mg by mouth 2 (two) times daily.       Marland Kitchen HYDROcodone-acetaminophen (LORTAB) 7.5-500 MG per tablet prn      . meclizine (ANTIVERT) 12.5 MG tablet Take 12.5 mg by mouth 3 (three) times daily.       . phenytoin (DILANTIN) 100 MG ER capsule Take 200 mg by mouth 2 (two) times daily.       . sodium bicarbonate 650 MG tablet Take 1,300 mg by mouth 2 (two) times daily.        No current facility-administered medications for this visit.   Facility-Administered Medications Ordered in Other Visits  Medication Dose Route Frequency Provider Last Rate Last Dose  . darbepoetin (ARANESP) injection 300 mcg  300 mcg Subcutaneous Once [name], MD   300 mcg at 05/20/12 1012    ALLERGIES:  is allergic to darvocet; penicillins; and sulfonamide derivatives.  REVIEW OF SYSTEMS:  The rest of the 14-point review of system was negative.   Filed Vitals:   05/20/12 0921  BP: 174/77  Pulse: 58  Temp: 96.7 F (35.9 C)  Resp: 20   Wt Readings from Last 3 Encounters:  05/20/12 156 lb 6.4 oz (70.943 kg)  01/29/12 168 lb 11.2 oz (76.522 kg)  11/24/11 170 lb (77.111 kg)   ECOG Performance status: 1  PHYSICAL EXAMINATION:  General:  well-nourished in no acute distress.  Eyes:  no scleral icterus.  ENT:  There were no oropharyngeal lesions.  Neck was without thyromegaly.  Lymphatics:  Negative cervical, supraclavicular or axillary adenopathy.  Respiratory: lungs were clear bilaterally without wheezing or crackles.  Cardiovascular:  Regular rate and rhythm, S1/S2, without murmur, rub or gallop.  There was no pedal edema.  GI:  abdomen was soft, flat, nontender, nondistended, without organomegaly.  Muscoloskeletal:  no spinal tenderness of palpation of vertebral spine.  Skin exam was without echymosis, petichae.  Neuro exam was nonfocal.  Patient was able to get on and off exam table without assistance.  Gait was normal.  Patient was alerted and oriented.  Attention was  good.   Language was appropriate.  Mood was normal without depression.  Speech was not pressured.  Thought content was not tangential.     LABORATORY/RADIOLOGY DATA:  Lab Results  Component Value Date   WBC 4.3 05/20/2012   HGB 9.7* 05/20/2012   HCT 28.8* 05/20/2012   PLT 157 05/20/2012   GLUCOSE 96 05/20/2012   ALKPHOS 155* 05/20/2012   ALT 12 05/20/2012   AST 16 05/20/2012   NA 137 05/20/2012   K 3.9 05/20/2012   CL 110* 05/20/2012   CREATININE 2.0* 05/20/2012   BUN 21.0 05/20/2012   CO2 17* 05/20/2012    ASSESSMENT AND PLAN:   1. Hypertension: BP meds were stopped per PCP. SBP is elevated today. I appreciate follow up with Dr. Tanya Nones for further assistance.   2. Chronic kidney disease, stage IIIB: Due to most likely HTN. He f/u with nephrology.I advised him to control his blood pressure to avoid worsening of his CKD.   3. CAD history: He is on Plavix and atorvastatin. I am not sure why he is not on beta blocker for secondary prevention. I defer to Dr. Caren Macadam expertise.   4. Hyperlipidemia: He is on atorvastatin per Dr. Tanya Nones.   5. Seizure: He is on Dilantin per Dr. Tanya Nones.   6. History of EtOH: Unclear amount of amount. Patient was rather evasive about it. I advised him that with seizure history and his age, alcohol consumption more than one glass of wine is not safe. He denied being alcoholics. We will address this issue again next visit.   7. Anemia due to renal insufficiency: The patient is on Aranesp and tolerating this well. Recommend that he continue Aranesp every 4 weeks for hemoglobin of less than 11. He will receive an Aranesp injection today.  8. Follow up: monthly CBC and Aranesp injection for Hgb <11. He has follow up in about 4 months.    The length of time of the face-to-face encounter was 15 minutes. More than 50% of time was spent counseling and coordination of care.

## 2012-06-17 ENCOUNTER — Ambulatory Visit (HOSPITAL_BASED_OUTPATIENT_CLINIC_OR_DEPARTMENT_OTHER): Payer: Medicare HMO

## 2012-06-17 ENCOUNTER — Other Ambulatory Visit (HOSPITAL_BASED_OUTPATIENT_CLINIC_OR_DEPARTMENT_OTHER): Payer: Medicare HMO | Admitting: Lab

## 2012-06-17 VITALS — BP 159/84 | HR 67 | Temp 97.5°F

## 2012-06-17 DIAGNOSIS — D631 Anemia in chronic kidney disease: Secondary | ICD-10-CM

## 2012-06-17 DIAGNOSIS — D649 Anemia, unspecified: Secondary | ICD-10-CM

## 2012-06-17 DIAGNOSIS — F1020 Alcohol dependence, uncomplicated: Secondary | ICD-10-CM

## 2012-06-17 DIAGNOSIS — N183 Chronic kidney disease, stage 3 unspecified: Secondary | ICD-10-CM

## 2012-06-17 DIAGNOSIS — Z86010 Personal history of colonic polyps: Secondary | ICD-10-CM

## 2012-06-17 DIAGNOSIS — N039 Chronic nephritic syndrome with unspecified morphologic changes: Secondary | ICD-10-CM

## 2012-06-17 DIAGNOSIS — I739 Peripheral vascular disease, unspecified: Secondary | ICD-10-CM

## 2012-06-17 DIAGNOSIS — G8929 Other chronic pain: Secondary | ICD-10-CM

## 2012-06-17 DIAGNOSIS — R569 Unspecified convulsions: Secondary | ICD-10-CM

## 2012-06-17 DIAGNOSIS — I10 Essential (primary) hypertension: Secondary | ICD-10-CM

## 2012-06-17 DIAGNOSIS — M545 Low back pain: Secondary | ICD-10-CM

## 2012-06-17 LAB — CBC WITH DIFFERENTIAL/PLATELET
BASO%: 1.2 % (ref 0.0–2.0)
EOS%: 2.7 % (ref 0.0–7.0)
LYMPH%: 19.9 % (ref 14.0–49.0)
MCHC: 34.3 g/dL (ref 32.0–36.0)
MCV: 113.9 fL — ABNORMAL HIGH (ref 79.3–98.0)
MONO%: 8.4 % (ref 0.0–14.0)
Platelets: 159 10*3/uL (ref 140–400)
RBC: 2.37 10*6/uL — ABNORMAL LOW (ref 4.20–5.82)
RDW: 15.5 % — ABNORMAL HIGH (ref 11.0–14.6)

## 2012-06-17 MED ORDER — DARBEPOETIN ALFA-POLYSORBATE 300 MCG/0.6ML IJ SOLN
300.0000 ug | Freq: Once | INTRAMUSCULAR | Status: AC
  Administered 2012-06-17: 300 ug via SUBCUTANEOUS
  Filled 2012-06-17: qty 0.6

## 2012-07-15 ENCOUNTER — Ambulatory Visit (HOSPITAL_BASED_OUTPATIENT_CLINIC_OR_DEPARTMENT_OTHER): Payer: Medicare HMO

## 2012-07-15 ENCOUNTER — Other Ambulatory Visit (HOSPITAL_BASED_OUTPATIENT_CLINIC_OR_DEPARTMENT_OTHER): Payer: Medicare HMO | Admitting: Lab

## 2012-07-15 ENCOUNTER — Other Ambulatory Visit: Payer: Self-pay | Admitting: *Deleted

## 2012-07-15 VITALS — BP 158/85 | HR 73 | Temp 96.9°F

## 2012-07-15 DIAGNOSIS — I739 Peripheral vascular disease, unspecified: Secondary | ICD-10-CM

## 2012-07-15 DIAGNOSIS — M545 Low back pain: Secondary | ICD-10-CM

## 2012-07-15 DIAGNOSIS — F1020 Alcohol dependence, uncomplicated: Secondary | ICD-10-CM

## 2012-07-15 DIAGNOSIS — D631 Anemia in chronic kidney disease: Secondary | ICD-10-CM

## 2012-07-15 DIAGNOSIS — D649 Anemia, unspecified: Secondary | ICD-10-CM

## 2012-07-15 DIAGNOSIS — Z86010 Personal history of colonic polyps: Secondary | ICD-10-CM

## 2012-07-15 DIAGNOSIS — N039 Chronic nephritic syndrome with unspecified morphologic changes: Secondary | ICD-10-CM

## 2012-07-15 DIAGNOSIS — N183 Chronic kidney disease, stage 3 unspecified: Secondary | ICD-10-CM

## 2012-07-15 DIAGNOSIS — R569 Unspecified convulsions: Secondary | ICD-10-CM

## 2012-07-15 DIAGNOSIS — I10 Essential (primary) hypertension: Secondary | ICD-10-CM

## 2012-07-15 LAB — CBC WITH DIFFERENTIAL/PLATELET
BASO%: 0.2 % (ref 0.0–2.0)
LYMPH%: 20 % (ref 14.0–49.0)
MCHC: 31.5 g/dL — ABNORMAL LOW (ref 32.0–36.0)
MONO#: 0.5 10*3/uL (ref 0.1–0.9)
Platelets: 228 10*3/uL (ref 140–400)
RBC: 2.18 10*6/uL — ABNORMAL LOW (ref 4.20–5.82)
RDW: 15.3 % — ABNORMAL HIGH (ref 11.0–14.6)
WBC: 4.5 10*3/uL (ref 4.0–10.3)
lymph#: 0.9 10*3/uL (ref 0.9–3.3)

## 2012-07-15 MED ORDER — DARBEPOETIN ALFA-POLYSORBATE 300 MCG/0.6ML IJ SOLN
300.0000 ug | Freq: Once | INTRAMUSCULAR | Status: AC
  Administered 2012-07-15: 300 ug via SUBCUTANEOUS
  Filled 2012-07-15: qty 0.6

## 2012-07-19 ENCOUNTER — Other Ambulatory Visit (HOSPITAL_COMMUNITY): Payer: Self-pay | Admitting: *Deleted

## 2012-07-19 DIAGNOSIS — R52 Pain, unspecified: Secondary | ICD-10-CM

## 2012-07-19 DIAGNOSIS — R609 Edema, unspecified: Secondary | ICD-10-CM

## 2012-07-20 ENCOUNTER — Ambulatory Visit (HOSPITAL_COMMUNITY)
Admission: RE | Admit: 2012-07-20 | Discharge: 2012-07-20 | Disposition: A | Discharge status: Home / Self Care | Payer: Medicare HMO | Source: Ambulatory Visit | Attending: Family Medicine | Admitting: Family Medicine

## 2012-07-20 DIAGNOSIS — R52 Pain, unspecified: Secondary | ICD-10-CM

## 2012-07-20 DIAGNOSIS — M79609 Pain in unspecified limb: Secondary | ICD-10-CM

## 2012-07-20 DIAGNOSIS — R609 Edema, unspecified: Secondary | ICD-10-CM | POA: Insufficient documentation

## 2012-07-20 NOTE — Progress Notes (Signed)
Left:  No evidence of DVT or Baker's cyst.  There appears to be superficial thrombus in the lesser saphenous vein.  Right:  Negative for DVT in the common femoral vein.

## 2012-07-26 ENCOUNTER — Encounter (HOSPITAL_COMMUNITY)
Admission: RE | Admit: 2012-07-26 | Discharge: 2012-07-26 | Disposition: A | Discharge status: Home / Self Care | Payer: Medicare HMO | Source: Ambulatory Visit | Attending: Family Medicine | Admitting: Family Medicine

## 2012-07-26 DIAGNOSIS — D649 Anemia, unspecified: Secondary | ICD-10-CM | POA: Insufficient documentation

## 2012-07-26 LAB — ABO/RH: ABO/RH(D): O NEG

## 2012-07-27 LAB — TYPE AND SCREEN
ABO/RH(D): O NEG
Antibody Screen: NEGATIVE

## 2012-08-11 ENCOUNTER — Other Ambulatory Visit: Payer: Self-pay | Admitting: Oncology

## 2012-08-11 DIAGNOSIS — D649 Anemia, unspecified: Secondary | ICD-10-CM

## 2012-08-11 DIAGNOSIS — N183 Chronic kidney disease, stage 3 unspecified: Secondary | ICD-10-CM

## 2012-08-12 ENCOUNTER — Other Ambulatory Visit (HOSPITAL_BASED_OUTPATIENT_CLINIC_OR_DEPARTMENT_OTHER): Payer: Medicare HMO | Admitting: Lab

## 2012-08-12 ENCOUNTER — Ambulatory Visit: Payer: Medicare HMO

## 2012-08-12 DIAGNOSIS — R569 Unspecified convulsions: Secondary | ICD-10-CM

## 2012-08-12 DIAGNOSIS — N039 Chronic nephritic syndrome with unspecified morphologic changes: Secondary | ICD-10-CM

## 2012-08-12 DIAGNOSIS — N183 Chronic kidney disease, stage 3 unspecified: Secondary | ICD-10-CM

## 2012-08-12 DIAGNOSIS — D649 Anemia, unspecified: Secondary | ICD-10-CM

## 2012-08-12 DIAGNOSIS — F1020 Alcohol dependence, uncomplicated: Secondary | ICD-10-CM

## 2012-08-12 DIAGNOSIS — I739 Peripheral vascular disease, unspecified: Secondary | ICD-10-CM

## 2012-08-12 DIAGNOSIS — I10 Essential (primary) hypertension: Secondary | ICD-10-CM

## 2012-08-12 DIAGNOSIS — G8929 Other chronic pain: Secondary | ICD-10-CM

## 2012-08-12 DIAGNOSIS — Z86010 Personal history of colonic polyps: Secondary | ICD-10-CM

## 2012-08-12 LAB — BASIC METABOLIC PANEL (CC13)
Calcium: 8.8 mg/dL (ref 8.4–10.4)
Creatinine: 2.1 mg/dL — ABNORMAL HIGH (ref 0.7–1.3)
Sodium: 138 mEq/L (ref 136–145)

## 2012-08-12 LAB — CBC WITH DIFFERENTIAL/PLATELET
BASO%: 1 % (ref 0.0–2.0)
EOS%: 2 % (ref 0.0–7.0)
HCT: 34.8 % — ABNORMAL LOW (ref 38.4–49.9)
LYMPH%: 27.1 % (ref 14.0–49.0)
MCH: 37.3 pg — ABNORMAL HIGH (ref 27.2–33.4)
MCHC: 34.1 g/dL (ref 32.0–36.0)
NEUT%: 60.4 % (ref 39.0–75.0)
Platelets: 255 10*3/uL (ref 140–400)
RBC: 3.17 10*6/uL — ABNORMAL LOW (ref 4.20–5.82)

## 2012-08-12 MED ORDER — DARBEPOETIN ALFA-POLYSORBATE 300 MCG/0.6ML IJ SOLN: 300.0000 ug | Freq: Once | INTRAMUSCULAR | Status: DC

## 2012-08-20 ENCOUNTER — Inpatient Hospital Stay (HOSPITAL_COMMUNITY)
Admission: EM | Admit: 2012-08-20 | Discharge: 2012-08-23 | DRG: 184 | Disposition: A | Discharge status: Home / Self Care | Payer: Medicare HMO | Attending: General Surgery | Admitting: General Surgery

## 2012-08-20 ENCOUNTER — Emergency Department (HOSPITAL_COMMUNITY): Payer: Medicare HMO

## 2012-08-20 ENCOUNTER — Encounter (HOSPITAL_COMMUNITY): Payer: Self-pay | Admitting: Emergency Medicine

## 2012-08-20 DIAGNOSIS — S3681XA Injury of peritoneum, initial encounter: Secondary | ICD-10-CM | POA: Diagnosis present

## 2012-08-20 DIAGNOSIS — S3282XA Multiple fractures of pelvis without disruption of pelvic ring, initial encounter for closed fracture: Secondary | ICD-10-CM | POA: Diagnosis present

## 2012-08-20 DIAGNOSIS — I82819 Embolism and thrombosis of superficial veins of unspecified lower extremities: Secondary | ICD-10-CM | POA: Diagnosis present

## 2012-08-20 DIAGNOSIS — N183 Chronic kidney disease, stage 3 unspecified: Secondary | ICD-10-CM | POA: Diagnosis present

## 2012-08-20 DIAGNOSIS — S36113A Laceration of liver, unspecified degree, initial encounter: Secondary | ICD-10-CM

## 2012-08-20 DIAGNOSIS — E785 Hyperlipidemia, unspecified: Secondary | ICD-10-CM | POA: Diagnosis present

## 2012-08-20 DIAGNOSIS — G8929 Other chronic pain: Secondary | ICD-10-CM | POA: Diagnosis present

## 2012-08-20 DIAGNOSIS — Z860100 Personal history of colon polyps, unspecified: Secondary | ICD-10-CM

## 2012-08-20 DIAGNOSIS — S2243XA Multiple fractures of ribs, bilateral, initial encounter for closed fracture: Secondary | ICD-10-CM | POA: Diagnosis present

## 2012-08-20 DIAGNOSIS — S2249XA Multiple fractures of ribs, unspecified side, initial encounter for closed fracture: Secondary | ICD-10-CM

## 2012-08-20 DIAGNOSIS — W19XXXA Unspecified fall, initial encounter: Secondary | ICD-10-CM | POA: Diagnosis present

## 2012-08-20 DIAGNOSIS — I739 Peripheral vascular disease, unspecified: Secondary | ICD-10-CM | POA: Diagnosis present

## 2012-08-20 DIAGNOSIS — D62 Acute posthemorrhagic anemia: Secondary | ICD-10-CM | POA: Diagnosis present

## 2012-08-20 DIAGNOSIS — Z9861 Coronary angioplasty status: Secondary | ICD-10-CM

## 2012-08-20 DIAGNOSIS — K661 Hemoperitoneum: Secondary | ICD-10-CM

## 2012-08-20 DIAGNOSIS — I2510 Atherosclerotic heart disease of native coronary artery without angina pectoris: Secondary | ICD-10-CM | POA: Diagnosis present

## 2012-08-20 DIAGNOSIS — Y998 Other external cause status: Secondary | ICD-10-CM

## 2012-08-20 DIAGNOSIS — M47817 Spondylosis without myelopathy or radiculopathy, lumbosacral region: Secondary | ICD-10-CM | POA: Diagnosis present

## 2012-08-20 DIAGNOSIS — S32509A Unspecified fracture of unspecified pubis, initial encounter for closed fracture: Secondary | ICD-10-CM | POA: Diagnosis present

## 2012-08-20 DIAGNOSIS — N189 Chronic kidney disease, unspecified: Secondary | ICD-10-CM | POA: Diagnosis present

## 2012-08-20 DIAGNOSIS — Z79899 Other long term (current) drug therapy: Secondary | ICD-10-CM

## 2012-08-20 DIAGNOSIS — M47812 Spondylosis without myelopathy or radiculopathy, cervical region: Secondary | ICD-10-CM | POA: Diagnosis present

## 2012-08-20 DIAGNOSIS — S2239XA Fracture of one rib, unspecified side, initial encounter for closed fracture: Secondary | ICD-10-CM

## 2012-08-20 DIAGNOSIS — S36899A Unspecified injury of other intra-abdominal organs, initial encounter: Secondary | ICD-10-CM | POA: Diagnosis present

## 2012-08-20 DIAGNOSIS — F1020 Alcohol dependence, uncomplicated: Secondary | ICD-10-CM | POA: Diagnosis present

## 2012-08-20 DIAGNOSIS — I129 Hypertensive chronic kidney disease with stage 1 through stage 4 chronic kidney disease, or unspecified chronic kidney disease: Secondary | ICD-10-CM | POA: Diagnosis present

## 2012-08-20 LAB — CBC WITH DIFFERENTIAL/PLATELET
Basophils Relative: 1 % (ref 0–1)
Hemoglobin: 9 g/dL — ABNORMAL LOW (ref 13.0–17.0)
Lymphocytes Relative: 19 % (ref 12–46)
Lymphs Abs: 1.8 10*3/uL (ref 0.7–4.0)
MCHC: 33.2 g/dL (ref 30.0–36.0)
Monocytes Relative: 3 % (ref 3–12)
Neutro Abs: 7 10*3/uL (ref 1.7–7.7)
Neutrophils Relative %: 76 % (ref 43–77)
RBC: 2.54 MIL/uL — ABNORMAL LOW (ref 4.22–5.81)
WBC: 9.2 10*3/uL (ref 4.0–10.5)

## 2012-08-20 LAB — COMPREHENSIVE METABOLIC PANEL
Albumin: 2.7 g/dL — ABNORMAL LOW (ref 3.5–5.2)
Alkaline Phosphatase: 92 U/L (ref 39–117)
BUN: 52 mg/dL — ABNORMAL HIGH (ref 6–23)
CO2: 14 mEq/L — ABNORMAL LOW (ref 19–32)
Chloride: 104 mEq/L (ref 96–112)
Potassium: 5.2 mEq/L — ABNORMAL HIGH (ref 3.5–5.1)
Total Bilirubin: 0.1 mg/dL — ABNORMAL LOW (ref 0.3–1.2)

## 2012-08-20 LAB — TROPONIN I: Troponin I: <0.3 ng/mL (ref <0.30)

## 2012-08-20 MED ORDER — FOLIC ACID 1 MG PO TABS
1.0000 mg | ORAL_TABLET | Freq: Every day | ORAL | Status: DC
  Administered 2012-08-21 – 2012-08-23 (×3): 1 mg via ORAL
  Filled 2012-08-20 (×3): qty 1

## 2012-08-20 MED ORDER — DEXTROSE-NACL 5-0.9 % IV SOLN
INTRAVENOUS | Status: DC
  Administered 2012-08-21: 75 mL/h via INTRAVENOUS

## 2012-08-20 MED ORDER — VITAMIN B-1 100 MG PO TABS
100.0000 mg | ORAL_TABLET | Freq: Every day | ORAL | Status: DC
  Administered 2012-08-21 – 2012-08-23 (×3): 100 mg via ORAL
  Filled 2012-08-20 (×3): qty 1

## 2012-08-20 MED ORDER — DOCUSATE SODIUM 100 MG PO CAPS
100.0000 mg | ORAL_CAPSULE | Freq: Two times a day (BID) | ORAL | Status: DC
  Administered 2012-08-21 – 2012-08-23 (×5): 100 mg via ORAL
  Filled 2012-08-20 (×7): qty 1

## 2012-08-20 MED ORDER — ONDANSETRON HCL 4 MG/2ML IJ SOLN
4.0000 mg | Freq: Four times a day (QID) | INTRAMUSCULAR | Status: DC | PRN
  Administered 2012-08-21: 4 mg via INTRAVENOUS
  Filled 2012-08-20 (×2): qty 2

## 2012-08-20 MED ORDER — THIAMINE HCL 100 MG/ML IJ SOLN
100.0000 mg | Freq: Every day | INTRAMUSCULAR | Status: DC
  Filled 2012-08-20 (×3): qty 1

## 2012-08-20 MED ORDER — HYDROMORPHONE HCL PF 1 MG/ML IJ SOLN
1.0000 mg | INTRAMUSCULAR | Status: DC | PRN
  Administered 2012-08-21 (×3): 1 mg via INTRAVENOUS
  Filled 2012-08-20: qty 1

## 2012-08-20 MED ORDER — ADULT MULTIVITAMIN W/MINERALS CH
1.0000 | ORAL_TABLET | Freq: Every day | ORAL | Status: DC
  Administered 2012-08-21 – 2012-08-23 (×3): 1 via ORAL
  Filled 2012-08-20 (×3): qty 1

## 2012-08-20 MED ORDER — PANTOPRAZOLE SODIUM 40 MG PO TBEC: 40.0000 mg | DELAYED_RELEASE_TABLET | Freq: Every day | ORAL | Status: DC

## 2012-08-20 MED ORDER — LORAZEPAM 2 MG/ML IJ SOLN
1.0000 mg | Freq: Four times a day (QID) | INTRAMUSCULAR | Status: DC | PRN
  Administered 2012-08-21: 1 mg via INTRAVENOUS
  Filled 2012-08-20: qty 1

## 2012-08-20 MED ORDER — PANTOPRAZOLE SODIUM 40 MG IV SOLR
40.0000 mg | Freq: Every day | INTRAVENOUS | Status: DC
  Filled 2012-08-20: qty 40

## 2012-08-20 MED ORDER — LORAZEPAM 1 MG PO TABS: 1.0000 mg | ORAL_TABLET | Freq: Four times a day (QID) | ORAL | Status: DC | PRN

## 2012-08-20 MED ORDER — HYDROMORPHONE HCL PF 1 MG/ML IJ SOLN
1.0000 mg | INTRAMUSCULAR | Status: DC | PRN
  Filled 2012-08-20 (×2): qty 1

## 2012-08-20 MED ORDER — ONDANSETRON HCL 4 MG PO TABS: 4.0000 mg | ORAL_TABLET | Freq: Four times a day (QID) | ORAL | Status: DC | PRN

## 2012-08-20 MED ORDER — OXYCODONE HCL 5 MG PO TABS
10.0000 mg | ORAL_TABLET | ORAL | Status: DC | PRN
  Administered 2012-08-21: 10 mg via ORAL
  Filled 2012-08-20: qty 2

## 2012-08-20 NOTE — ED Provider Notes (Signed)
History     CSN: [id number]  Arrival date & time 08/20/12  1918   First MD Initiated Contact with Patient 08/20/12 1921      Chief Complaint  Patient presents with  . Fall    (Consider location/radiation/quality/duration/timing/severity/associated sxs/prior treatment) HPI Comments: Patient presents for EMS after a fall. He states he's been out of bed and got up and lost his balance. He denies losing consciousness. He complains of pain in his right mid back. Denies any headache, vomiting, chest pain, shortness of breath. Denies any dizziness or lightheadedness.  History is limited as patient is uncooperative and likely intoxicated. Level V caveat.  The history is provided by the patient. The history is limited by the condition of the patient.    Past Medical History  Diagnosis Date  . Hypertension   . Chronic low back pain     right leg pain   . Hip pain     left  . Seizures 2006  . ETOHism   . Cataract   . CAD (coronary artery disease) 2001    s/p PTCI  . PVD (posterior vitreous detachment), left eye   . Anemia     CKD  . Colon polyps   . Chronic kidney disease (CKD), stage III (moderate)   . Hyperlipidemia   . Hyperkalemia     Past Surgical History  Procedure Date  . Cataracts surgery   . Appendectomy 1955  . Gastric outlet obstruction surgery 1988  . Lumbar disc surgery     Family History  Problem Relation Age of Onset  . Cancer Son 50    colon cancer    History  Substance Use Topics  . Smoking status: Never Smoker   . Smokeless tobacco: Not on file  . Alcohol Use: 1.0 oz/week    2 drink(s) per week      Review of Systems  Unable to perform ROS: Mental status change    Allergies  Darvocet; Penicillins; and Sulfonamide derivatives  Home Medications   Current Outpatient Rx  Name  Route  Sig  Dispense  Refill  . AMLODIPINE BESYLATE 10 MG PO TABS   Oral   Take 10 mg by mouth daily.          . ATORVASTATIN CALCIUM 40 MG PO TABS    Oral   Take 40 mg by mouth at bedtime.          Marland Kitchen CALCITRIOL 0.25 MCG PO CAPS   Oral   Take 0.25 mcg by mouth daily.          Marland Kitchen CLOPIDOGREL BISULFATE 75 MG PO TABS   Oral   Take 75 mg by mouth daily.          Marland Kitchen DARBEPOETIN ALFA-POLYSORBATE 200 MCG/0.4ML IJ SOLN   Subcutaneous   Inject 200 mcg into the skin every 28 (twenty-eight) days. Next dose due Monday         . DIAZEPAM 5 MG PO TABS   Oral   Take 5 mg by mouth. prn         . FERROUS SULFATE 325 (65 FE) MG PO TABS   Oral   Take 325 mg by mouth 2 (two) times daily.          Marland Kitchen HYDROCODONE-ACETAMINOPHEN 7.5-500 MG PO TABS   Oral   Take 1 tablet by mouth every 6 (six) hours as needed. For pain.         . MECLIZINE HCL 12.5 MG PO TABS  Oral   Take 12.5 mg by mouth 3 (three) times daily.          Marland Kitchen PHENYTOIN SODIUM EXTENDED 100 MG PO CAPS   Oral   Take 200 mg by mouth 2 (two) times daily.          . SODIUM BICARBONATE 650 MG PO TABS   Oral   Take 1,300 mg by mouth 2 (two) times daily.          Marland Kitchen BENAZEPRIL HCL 10 MG PO TABS   Oral   Take 1 tablet by mouth daily.           BP 123/82  Pulse 67  Resp 15  SpO2 95%  Physical Exam  Constitutional: He appears well-developed and well-nourished. No distress.  HENT:  Head: Normocephalic and atraumatic.  Mouth/Throat: Oropharynx is clear and moist. No oropharyngeal exudate.  Eyes: Conjunctivae normal and EOM are normal. Pupils are equal, round, and reactive to light.  Neck: Normal range of motion. Neck supple.       No midline C-spine pain  Cardiovascular: Normal rate, regular rhythm and normal heart sounds.   No murmur heard. Pulmonary/Chest: Effort normal and breath sounds normal. No respiratory distress. He exhibits no tenderness.  Abdominal: Soft. There is no tenderness. There is no rebound and no guarding.  Musculoskeletal: Normal range of motion. He exhibits tenderness.       Right paraspinal thoracic and lateral rib pain  Neurological:  No cranial nerve deficit. He exhibits normal muscle tone. Coordination normal.       Moving all extremities, not cooperative with exam.  Skin: Skin is warm.    ED Course  Procedures (including critical care time)  Labs Reviewed  CBC WITH DIFFERENTIAL - Abnormal; Notable for the following:    RBC 2.54 (*)     Hemoglobin 9.0 (*)     HCT 27.1 (*)     MCV 106.7 (*)     MCH 35.4 (*)     All other components within normal limits  COMPREHENSIVE METABOLIC PANEL - Abnormal; Notable for the following:    Sodium 132 (*)     Potassium 5.2 (*)     CO2 14 (*)     Glucose, Bld 122 (*)     BUN 52 (*)     Creatinine, Ser 2.07 (*)     Calcium 8.2 (*)     Albumin 2.7 (*)     AST 73 (*)     Total Bilirubin 0.1 (*)     GFR calc non Af Amer 28 (*)     GFR calc Af Amer 32 (*)     All other components within normal limits  ETHANOL - Abnormal; Notable for the following:    Alcohol, Ethyl (B) 157 (*)     All other components within normal limits  TROPONIN I  PHENYTOIN LEVEL, TOTAL  CBC  COMPREHENSIVE METABOLIC PANEL   Ct Abdomen Pelvis Wo Contrast  08/20/2012  *RADIOLOGY REPORT*  Clinical Data:  Fall.  Ground-level fall.  Right-sided low back pain.  Left-sided chest pain.  Patient on anticoagulation.  CT CHEST, ABDOMEN AND PELVIS WITHOUT CONTRAST  Technique:  Multidetector CT imaging of the chest, abdomen and pelvis was performed following the standard protocol without IV contrast.  Comparison:  03/28/2011.  CT CHEST  Findings:  There is no pneumothorax.   Left lateral 7th through 11th rib fractures are present which are nondisplaced.  Dependent atelectasis is present in the lungs.  Large hiatal hernia. Coronary artery atherosclerosis is present. If office based assessment of coronary risk factors has not been performed, it is now recommended. No pleural or pericardial effusion.  Displaced right 12, 11, 10, 9 and nondisplaced 8th rib fractures are also present.  The scapula appears intact.  Sternum  intact.  Age indeterminant but likely chronic T7 and T8 compression fractures are present with between 25 and 50% loss vertebral body height.  No retropulsion.  No paravertebral hematoma or phlegmon identified.  Severe aortic and branch vessel atherosclerosis.  No adenopathy. No airspace disease in the lungs.  Three-vessel aortic arch. Sub-centimeter thyroid nodule(s) noted, too small to characterize, but most likely benign in the absence of known clinical risk factors for thyroid carcinoma.  IMPRESSION: 1.  Multiple nondisplaced left rib fractures with displaced right inferior rib fractures, detailed above. No pneumothorax. 2.  Dependent atelectasis in the lungs. 3.  Age indeterminate  T7 and T8 compression fractures; no retropulsion.  CT ABDOMEN AND PELVIS  Findings:  Hemoperitoneum is present with blood around the margin of the spleen.   There is also hemoperitoneum around the liver and collecting in Morison's pouch compatible with sentinel clot.  Biliary sludge is present within the gallbladder.  Small amount of blood is present in the anatomic pelvis.  Colonic diverticulosis. Aortic atherosclerosis.  Mild renal atrophy.  Renal vascular calcifications are present.  No definite collecting system calculi. Both ureters appear within normal limits.  Left inferior pubic ramus fracture is present.  There is also a medial left acetabular wall fracture.  Femur appears intact bilaterally.  No displaced sacral fracture is identified.  Severe multilevel lumbar spondylosis.  No acute appearing lumbar spine compression fractures present.  Urinary bladder appears normal.  IMPRESSION: 1.  Hemoperitoneum.  Based on rib displacement, this probably is secondary to a liver laceration. Probable posterior right hepatic lobe liver laceration (image number 54 series 2, image number 61 series 602) Sentinel clot is present around the right hepatic lobe and in Morison's pouch. 2.  High-density material in the gallbladder compatible with  biliary sludge. 3.  Left inferior pubic ramus fracture may be acute or subacute. Nondisplaced left medial acetabular wall fracture.  Critical Value/emergent results were called by telephone at the time of interpretation on 08/20/2012 at 2220 hours to Dr. Manus Gunning, who verbally acknowledged these results.   Original Report Authenticated By: Andreas Newport, M.D.    Ct Head Wo Contrast  08/20/2012  *RADIOLOGY REPORT*  Clinical Data:  Fall.  Trauma.  Neck pain.  CT HEAD WITHOUT CONTRAST CT CERVICAL SPINE WITHOUT CONTRAST  Technique:  Multidetector CT imaging of the head and cervical spine was performed following the standard protocol without intravenous contrast.  Multiplanar CT image reconstructions of the cervical spine were also generated.  Comparison:  08/09/2010.  CT HEAD  Findings: No mass lesion, mass effect, midline shift, hydrocephalus, hemorrhage.  No acute territorial cortical ischemia/infarct. Atrophy and chronic ischemic white matter disease is present.  Calvarium intact.  IMPRESSION: Atrophy and chronic ischemic white matter disease without acute intracranial abnormality.  CT CERVICAL SPINE  Findings: Alignment is unchanged compared to prior. Anterolisthesis of C7 on T1 measuring 3 mm is similar.  2 mm of anterolisthesis of C3-C4 is unchanged as well.  The craniocervical junction appears unchanged.  Cystic changes in the lateral masses of C1 appears similar to prior, without fracture. Chondrocalcinosis of the transverse ligament of the atlas. Multilevel facet arthrosis.  Central stenosis is most pronounced at C5-C6 associated with  disc osteophyte complex.  There is step-off deformity in the clivus due to motion artifact.  Prevertebral soft tissues are within normal limits.  Small gas collection is present to the left of the trachea and esophagus at the thoracic inlet, most compatible with an esophageal diverticulum.  Lung apices are within normal limits.  Carotid atherosclerosis.  IMPRESSION: No acute  abnormality.  Unchanged severe multilevel degenerative cervical spine disease.   Original Report Authenticated By: Andreas Newport, M.D.    Ct Chest Wo Contrast  08/20/2012  *RADIOLOGY REPORT*  Clinical Data:  Fall.  Ground-level fall.  Right-sided low back pain.  Left-sided chest pain.  Patient on anticoagulation.  CT CHEST, ABDOMEN AND PELVIS WITHOUT CONTRAST  Technique:  Multidetector CT imaging of the chest, abdomen and pelvis was performed following the standard protocol without IV contrast.  Comparison:  03/28/2011.  CT CHEST  Findings:  There is no pneumothorax.   Left lateral 7th through 11th rib fractures are present which are nondisplaced.  Dependent atelectasis is present in the lungs.  Large hiatal hernia. Coronary artery atherosclerosis is present. If office based assessment of coronary risk factors has not been performed, it is now recommended. No pleural or pericardial effusion.  Displaced right 12, 11, 10, 9 and nondisplaced 8th rib fractures are also present.  The scapula appears intact.  Sternum intact.  Age indeterminant but likely chronic T7 and T8 compression fractures are present with between 25 and 50% loss vertebral body height.  No retropulsion.  No paravertebral hematoma or phlegmon identified.  Severe aortic and branch vessel atherosclerosis.  No adenopathy. No airspace disease in the lungs.  Three-vessel aortic arch. Sub-centimeter thyroid nodule(s) noted, too small to characterize, but most likely benign in the absence of known clinical risk factors for thyroid carcinoma.  IMPRESSION: 1.  Multiple nondisplaced left rib fractures with displaced right inferior rib fractures, detailed above. No pneumothorax. 2.  Dependent atelectasis in the lungs. 3.  Age indeterminate  T7 and T8 compression fractures; no retropulsion.  CT ABDOMEN AND PELVIS  Findings:  Hemoperitoneum is present with blood around the margin of the spleen.   There is also hemoperitoneum around the liver and collecting in  Morison's pouch compatible with sentinel clot.  Biliary sludge is present within the gallbladder.  Small amount of blood is present in the anatomic pelvis.  Colonic diverticulosis. Aortic atherosclerosis.  Mild renal atrophy.  Renal vascular calcifications are present.  No definite collecting system calculi. Both ureters appear within normal limits.  Left inferior pubic ramus fracture is present.  There is also a medial left acetabular wall fracture.  Femur appears intact bilaterally.  No displaced sacral fracture is identified.  Severe multilevel lumbar spondylosis.  No acute appearing lumbar spine compression fractures present.  Urinary bladder appears normal.  IMPRESSION: 1.  Hemoperitoneum.  Based on rib displacement, this probably is secondary to a liver laceration. Probable posterior right hepatic lobe liver laceration (image number 54 series 2, image number 61 series 602) Sentinel clot is present around the right hepatic lobe and in Morison's pouch. 2.  High-density material in the gallbladder compatible with biliary sludge. 3.  Left inferior pubic ramus fracture may be acute or subacute. Nondisplaced left medial acetabular wall fracture.  Critical Value/emergent results were called by telephone at the time of interpretation on 08/20/2012 at 2220 hours to Dr. Manus Gunning, who verbally acknowledged these results.   Original Report Authenticated By: Andreas Newport, M.D.    Ct Cervical Spine Wo Contrast  08/20/2012  *  RADIOLOGY REPORT*  Clinical Data:  Fall.  Trauma.  Neck pain.  CT HEAD WITHOUT CONTRAST CT CERVICAL SPINE WITHOUT CONTRAST  Technique:  Multidetector CT imaging of the head and cervical spine was performed following the standard protocol without intravenous contrast.  Multiplanar CT image reconstructions of the cervical spine were also generated.  Comparison:  08/09/2010.  CT HEAD  Findings: No mass lesion, mass effect, midline shift, hydrocephalus, hemorrhage.  No acute territorial cortical  ischemia/infarct. Atrophy and chronic ischemic white matter disease is present.  Calvarium intact.  IMPRESSION: Atrophy and chronic ischemic white matter disease without acute intracranial abnormality.  CT CERVICAL SPINE  Findings: Alignment is unchanged compared to prior. Anterolisthesis of C7 on T1 measuring 3 mm is similar.  2 mm of anterolisthesis of C3-C4 is unchanged as well.  The craniocervical junction appears unchanged.  Cystic changes in the lateral masses of C1 appears similar to prior, without fracture. Chondrocalcinosis of the transverse ligament of the atlas. Multilevel facet arthrosis.  Central stenosis is most pronounced at C5-C6 associated with disc osteophyte complex.  There is step-off deformity in the clivus due to motion artifact.  Prevertebral soft tissues are within normal limits.  Small gas collection is present to the left of the trachea and esophagus at the thoracic inlet, most compatible with an esophageal diverticulum.  Lung apices are within normal limits.  Carotid atherosclerosis.  IMPRESSION: No acute abnormality.  Unchanged severe multilevel degenerative cervical spine disease.   Original Report Authenticated By: Andreas Newport, M.D.    Dg Chest Portable 1 View  08/20/2012  *RADIOLOGY REPORT*  Clinical Data: Fall.  Pain.  PORTABLE CHEST - 1 VIEW  Comparison: PA and lateral chest 09/11/2011 and 08/22/2012.  Findings: Lung volumes are lower than on the comparison studies with basilar atelectasis.  There may be a very small right pleural effusion.  No pneumothorax identified.  Heart size is upper normal. Hiatal hernia is noted. Severe degenerative change is present about the shoulders.  IMPRESSION:  1.  Possible trace right pleural effusion. 2.  Small hiatal hernia.   Original Report Authenticated By: Holley Dexter, M.D.      1. Hemoperitoneum   2. Multiple pelvic fractures   3. Rib fractures       MDM  Alcohol intoxication with fall presenting with right-sided back  pain. Vital stable, no distress, no appreciable neuro deficits.  Patient is unable to provide a history and is on Plavix so will obtain imaging of head.  CT head and C-spine negative. Chest x-ray without obvious pneumothorax or rib fracture.  Given patient's intoxication, pain and inability to provide a history will obtain further imaging of the chest and abdomen.  Called by radiology regarding multiple bilateral rib fractures with hemoperitoneum. No pneumothorax. Patient remains hemodynamically stable without abdominal pain. Patient also has age indeterminate pelvic and thoracic compression fractures.  Discussed with Dr. Luisa Hart of trauma surgery.  D/w Dr Rennis Chris of orthopedics as well.    Date: 08/20/2012  Rate: 60  Rhythm: normal sinus rhythm  QRS Axis: normal  Intervals: normal  ST/T Wave abnormalities: normal  Conduction Disutrbances:none  Narrative Interpretation:   Old EKG Reviewed: none available   CRITICAL CARE Performed by: Glynn Octave   Total critical care time: 40  Critical care time was exclusive of separately billable procedures and treating other patients.  Critical care was necessary to treat or prevent imminent or life-threatening deterioration.  Critical care was time spent personally by me on the following activities: development of  treatment plan with patient and/or surrogate as well as nursing, discussions with consultants, evaluation of patient's response to treatment, examination of patient, obtaining history from patient or surrogate, ordering and performing treatments and interventions, ordering and review of laboratory studies, ordering and review of radiographic studies, pulse oximetry and re-evaluation of patient's condition.    Glynn Octave, MD 08/21/12 [phone]

## 2012-08-20 NOTE — ED Notes (Signed)
Per EMS - pt coming from home, reports he was standing when he lost his balance and fell. Pt reports drinking about a half a pint of gin. Pt a&ox4. Pt c/o right lateral back and left lateral chest pain. Pt denies headache. 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 BP 144/96 NSR HR, CBG 104. Pt takes plavix. Pt denies hitting head and LOC.

## 2012-08-20 NOTE — ED Notes (Signed)
Surgeon at bedside.  

## 2012-08-20 NOTE — H&P (Signed)
Larry West is an 78 y.o. male.   Chief Complaint: fall HPI: Pt fell tonight.  History difficult to get. Pt has been drinking.  C/O of right sided chest pain.  Denies back pain or hip pain.  Poor historian.  No SOB.  Some right mid abdominal pain.  No nausea or vomiting. DENIES NECK PAIN OR HA  Past Medical History  Diagnosis Date  . Hypertension   . Chronic low back pain     right leg pain   . Hip pain     left  . Seizures 2006  . ETOHism   . Cataract   . CAD (coronary artery disease) 2001    s/p PTCI  . PVD (posterior vitreous detachment), left eye   . Anemia     CKD  . Colon polyps   . Chronic kidney disease (CKD), stage III (moderate)   . Hyperlipidemia   . Hyperkalemia     Past Surgical History  Procedure Date  . Cataracts surgery   . Appendectomy 1955  . Gastric outlet obstruction surgery 1988  . Lumbar disc surgery     Family History  Problem Relation Age of Onset  . Cancer Son 8    colon cancer   Social History:  reports that he has never smoked. He does not have any smokeless tobacco history on file. He reports that he drinks about one ounce of alcohol per week. He reports that he does not use illicit drugs.  Allergies:  Allergies  Allergen Reactions  . Darvocet (Propoxyphene-Acetaminophen) Nausea Only  . Penicillins Swelling    Unknown    . Sulfonamide Derivatives Itching     (Not in a hospital admission)  Results for orders placed during the hospital encounter of 08/20/12 (from the past 48 hour(s))  CBC WITH DIFFERENTIAL     Status: Abnormal   Collection Time   08/20/12  7:41 PM      Component Value Range Comment   WBC 9.2  4.0 - 10.5 K/uL    RBC 2.54 (*) 4.22 - 5.81 MIL/uL    Hemoglobin 9.0 (*) 13.0 - 17.0 g/dL    HCT 16.1 (*) 09.6 - 52.0 %    MCV 106.7 (*) 78.0 - 100.0 fL    MCH 35.4 (*) 26.0 - 34.0 pg    MCHC 33.2  30.0 - 36.0 g/dL    RDW 04.5  40.9 - 81.1 %    Platelets 263  150 - 400 K/uL    Neutrophils Relative 76  43 - 77 %     Neutro Abs 7.0  1.7 - 7.7 K/uL    Lymphocytes Relative 19  12 - 46 %    Lymphs Abs 1.8  0.7 - 4.0 K/uL    Monocytes Relative 3  3 - 12 %    Monocytes Absolute 0.3  0.1 - 1.0 K/uL    Eosinophils Relative 1  0 - 5 %    Eosinophils Absolute 0.1  0.0 - 0.7 K/uL    Basophils Relative 1  0 - 1 %    Basophils Absolute 0.1  0.0 - 0.1 K/uL   COMPREHENSIVE METABOLIC PANEL     Status: Abnormal   Collection Time   08/20/12  7:41 PM      Component Value Range Comment   Sodium 132 (*) 135 - 145 mEq/L    Potassium 5.2 (*) 3.5 - 5.1 mEq/L    Chloride 104  96 - 112 mEq/L    CO2 14 (*)  19 - 32 mEq/L    Glucose, Bld 122 (*) 70 - 99 mg/dL    BUN 52 (*) 6 - 23 mg/dL    Creatinine, Ser 1.61 (*) 0.50 - 1.35 mg/dL    Calcium 8.2 (*) 8.4 - 10.5 mg/dL    Total Protein 6.2  6.0 - 8.3 g/dL    Albumin 2.7 (*) 3.5 - 5.2 g/dL    AST 73 (*) 0 - 37 U/L    ALT 39  0 - 53 U/L    Alkaline Phosphatase 92  39 - 117 U/L    Total Bilirubin 0.1 (*) 0.3 - 1.2 mg/dL    GFR calc non Af Amer 28 (*) >90 mL/min    GFR calc Af Amer 32 (*) >90 mL/min   ETHANOL     Status: Abnormal   Collection Time   08/20/12  7:41 PM      Component Value Range Comment   Alcohol, Ethyl (B) 157 (*) 0 - 11 mg/dL   PHENYTOIN LEVEL, TOTAL     Status: Normal   Collection Time   08/20/12  7:41 PM      Component Value Range Comment   Phenytoin Lvl 19.3  10.0 - 20.0 ug/mL   TROPONIN I     Status: Normal   Collection Time   08/20/12  7:42 PM      Component Value Range Comment   Troponin I <0.30  <0.30 ng/mL    Ct Abdomen Pelvis Wo Contrast  08/20/2012  *RADIOLOGY REPORT*  Clinical Data:  Fall.  Ground-level fall.  Right-sided low back pain.  Left-sided chest pain.  Patient on anticoagulation.  CT CHEST, ABDOMEN AND PELVIS WITHOUT CONTRAST  Technique:  Multidetector CT imaging of the chest, abdomen and pelvis was performed following the standard protocol without IV contrast.  Comparison:  03/28/2011.  CT CHEST  Findings:  There is no  pneumothorax.   Left lateral 7th through 11th rib fractures are present which are nondisplaced.  Dependent atelectasis is present in the lungs.  Large hiatal hernia. Coronary artery atherosclerosis is present. If office based assessment of coronary risk factors has not been performed, it is now recommended. No pleural or pericardial effusion.  Displaced right 12, 11, 10, 9 and nondisplaced 8th rib fractures are also present.  The scapula appears intact.  Sternum intact.  Age indeterminant but likely chronic T7 and T8 compression fractures are present with between 25 and 50% loss vertebral body height.  No retropulsion.  No paravertebral hematoma or phlegmon identified.  Severe aortic and branch vessel atherosclerosis.  No adenopathy. No airspace disease in the lungs.  Three-vessel aortic arch. Sub-centimeter thyroid nodule(s) noted, too small to characterize, but most likely benign in the absence of known clinical risk factors for thyroid carcinoma.  IMPRESSION: 1.  Multiple nondisplaced left rib fractures with displaced right inferior rib fractures, detailed above. No pneumothorax. 2.  Dependent atelectasis in the lungs. 3.  Age indeterminate  T7 and T8 compression fractures; no retropulsion.  CT ABDOMEN AND PELVIS  Findings:  Hemoperitoneum is present with blood around the margin of the spleen.   There is also hemoperitoneum around the liver and collecting in Morison's pouch compatible with sentinel clot.  Biliary sludge is present within the gallbladder.  Small amount of blood is present in the anatomic pelvis.  Colonic diverticulosis. Aortic atherosclerosis.  Mild renal atrophy.  Renal vascular calcifications are present.  No definite collecting system calculi. Both ureters appear within normal limits.  Left inferior  pubic ramus fracture is present.  There is also a medial left acetabular wall fracture.  Femur appears intact bilaterally.  No displaced sacral fracture is identified.  Severe multilevel lumbar  spondylosis.  No acute appearing lumbar spine compression fractures present.  Urinary bladder appears normal.  IMPRESSION: 1.  Hemoperitoneum.  Based on rib displacement, this probably is secondary to a liver laceration. Probable posterior right hepatic lobe liver laceration (image number 54 series 2, image number 61 series 602) Sentinel clot is present around the right hepatic lobe and in Morison's pouch. 2.  High-density material in the gallbladder compatible with biliary sludge. 3.  Left inferior pubic ramus fracture may be acute or subacute. Nondisplaced left medial acetabular wall fracture.  Critical Value/emergent results were called by telephone at the time of interpretation on 08/20/2012 at 2220 hours to Dr. Manus Gunning, who verbally acknowledged these results.   Original Report Authenticated By: Andreas Newport, M.D.    Ct Head Wo Contrast  08/20/2012  *RADIOLOGY REPORT*  Clinical Data:  Fall.  Trauma.  Neck pain.  CT HEAD WITHOUT CONTRAST CT CERVICAL SPINE WITHOUT CONTRAST  Technique:  Multidetector CT imaging of the head and cervical spine was performed following the standard protocol without intravenous contrast.  Multiplanar CT image reconstructions of the cervical spine were also generated.  Comparison:  08/09/2010.  CT HEAD  Findings: No mass lesion, mass effect, midline shift, hydrocephalus, hemorrhage.  No acute territorial cortical ischemia/infarct. Atrophy and chronic ischemic white matter disease is present.  Calvarium intact.  IMPRESSION: Atrophy and chronic ischemic white matter disease without acute intracranial abnormality.  CT CERVICAL SPINE  Findings: Alignment is unchanged compared to prior. Anterolisthesis of C7 on T1 measuring 3 mm is similar.  2 mm of anterolisthesis of C3-C4 is unchanged as well.  The craniocervical junction appears unchanged.  Cystic changes in the lateral masses of C1 appears similar to prior, without fracture. Chondrocalcinosis of the transverse ligament of the  atlas. Multilevel facet arthrosis.  Central stenosis is most pronounced at C5-C6 associated with disc osteophyte complex.  There is step-off deformity in the clivus due to motion artifact.  Prevertebral soft tissues are within normal limits.  Small gas collection is present to the left of the trachea and esophagus at the thoracic inlet, most compatible with an esophageal diverticulum.  Lung apices are within normal limits.  Carotid atherosclerosis.  IMPRESSION: No acute abnormality.  Unchanged severe multilevel degenerative cervical spine disease.   Original Report Authenticated By: Andreas Newport, M.D.    Ct Chest Wo Contrast  08/20/2012  *RADIOLOGY REPORT*  Clinical Data:  Fall.  Ground-level fall.  Right-sided low back pain.  Left-sided chest pain.  Patient on anticoagulation.  CT CHEST, ABDOMEN AND PELVIS WITHOUT CONTRAST  Technique:  Multidetector CT imaging of the chest, abdomen and pelvis was performed following the standard protocol without IV contrast.  Comparison:  03/28/2011.  CT CHEST  Findings:  There is no pneumothorax.   Left lateral 7th through 11th rib fractures are present which are nondisplaced.  Dependent atelectasis is present in the lungs.  Large hiatal hernia. Coronary artery atherosclerosis is present. If office based assessment of coronary risk factors has not been performed, it is now recommended. No pleural or pericardial effusion.  Displaced right 12, 11, 10, 9 and nondisplaced 8th rib fractures are also present.  The scapula appears intact.  Sternum intact.  Age indeterminant but likely chronic T7 and T8 compression fractures are present with between 25 and 50% loss vertebral body  height.  No retropulsion.  No paravertebral hematoma or phlegmon identified.  Severe aortic and branch vessel atherosclerosis.  No adenopathy. No airspace disease in the lungs.  Three-vessel aortic arch. Sub-centimeter thyroid nodule(s) noted, too small to characterize, but most likely benign in the absence  of known clinical risk factors for thyroid carcinoma.  IMPRESSION: 1.  Multiple nondisplaced left rib fractures with displaced right inferior rib fractures, detailed above. No pneumothorax. 2.  Dependent atelectasis in the lungs. 3.  Age indeterminate  T7 and T8 compression fractures; no retropulsion.  CT ABDOMEN AND PELVIS  Findings:  Hemoperitoneum is present with blood around the margin of the spleen.   There is also hemoperitoneum around the liver and collecting in Morison's pouch compatible with sentinel clot.  Biliary sludge is present within the gallbladder.  Small amount of blood is present in the anatomic pelvis.  Colonic diverticulosis. Aortic atherosclerosis.  Mild renal atrophy.  Renal vascular calcifications are present.  No definite collecting system calculi. Both ureters appear within normal limits.  Left inferior pubic ramus fracture is present.  There is also a medial left acetabular wall fracture.  Femur appears intact bilaterally.  No displaced sacral fracture is identified.  Severe multilevel lumbar spondylosis.  No acute appearing lumbar spine compression fractures present.  Urinary bladder appears normal.  IMPRESSION: 1.  Hemoperitoneum.  Based on rib displacement, this probably is secondary to a liver laceration. Probable posterior right hepatic lobe liver laceration (image number 54 series 2, image number 61 series 602) Sentinel clot is present around the right hepatic lobe and in Morison's pouch. 2.  High-density material in the gallbladder compatible with biliary sludge. 3.  Left inferior pubic ramus fracture may be acute or subacute. Nondisplaced left medial acetabular wall fracture.  Critical Value/emergent results were called by telephone at the time of interpretation on 08/20/2012 at 2220 hours to Dr. Manus Gunning, who verbally acknowledged these results.   Original Report Authenticated By: Andreas Newport, M.D.    Ct Cervical Spine Wo Contrast  08/20/2012  *RADIOLOGY REPORT*  Clinical  Data:  Fall.  Trauma.  Neck pain.  CT HEAD WITHOUT CONTRAST CT CERVICAL SPINE WITHOUT CONTRAST  Technique:  Multidetector CT imaging of the head and cervical spine was performed following the standard protocol without intravenous contrast.  Multiplanar CT image reconstructions of the cervical spine were also generated.  Comparison:  08/09/2010.  CT HEAD  Findings: No mass lesion, mass effect, midline shift, hydrocephalus, hemorrhage.  No acute territorial cortical ischemia/infarct. Atrophy and chronic ischemic white matter disease is present.  Calvarium intact.  IMPRESSION: Atrophy and chronic ischemic white matter disease without acute intracranial abnormality.  CT CERVICAL SPINE  Findings: Alignment is unchanged compared to prior. Anterolisthesis of C7 on T1 measuring 3 mm is similar.  2 mm of anterolisthesis of C3-C4 is unchanged as well.  The craniocervical junction appears unchanged.  Cystic changes in the lateral masses of C1 appears similar to prior, without fracture. Chondrocalcinosis of the transverse ligament of the atlas. Multilevel facet arthrosis.  Central stenosis is most pronounced at C5-C6 associated with disc osteophyte complex.  There is step-off deformity in the clivus due to motion artifact.  Prevertebral soft tissues are within normal limits.  Small gas collection is present to the left of the trachea and esophagus at the thoracic inlet, most compatible with an esophageal diverticulum.  Lung apices are within normal limits.  Carotid atherosclerosis.  IMPRESSION: No acute abnormality.  Unchanged severe multilevel degenerative cervical spine disease.  Original Report Authenticated By: Andreas Newport, M.D.    Dg Chest Portable 1 View  08/20/2012  *RADIOLOGY REPORT*  Clinical Data: Fall.  Pain.  PORTABLE CHEST - 1 VIEW  Comparison: PA and lateral chest 09/11/2011 and 08/22/2012.  Findings: Lung volumes are lower than on the comparison studies with basilar atelectasis.  There may be a very small  right pleural effusion.  No pneumothorax identified.  Heart size is upper normal. Hiatal hernia is noted. Severe degenerative change is present about the shoulders.  IMPRESSION:  1.  Possible trace right pleural effusion. 2.  Small hiatal hernia.   Original Report Authenticated By: Holley Dexter, M.D.     Review of Systems  HENT: Negative for neck pain.   Eyes: Negative.   Respiratory: Negative for cough, shortness of breath and wheezing.   Cardiovascular: Positive for chest pain.  Gastrointestinal: Positive for abdominal pain.  Genitourinary: Negative.   Musculoskeletal: Positive for falls. Negative for back pain and joint pain.  Skin: Negative.   Neurological: Positive for seizures and weakness. Negative for headaches.  Endo/Heme/Allergies: Bruises/bleeds easily.  Psychiatric/Behavioral: Positive for substance abuse.    Blood pressure 123/82, pulse 67, resp. rate 15, SpO2 95.00%. Physical Exam  Constitutional: He is oriented to person, place, and time. He appears well-developed and well-nourished.  HENT:  Head: Normocephalic and atraumatic.  Right Ear: External ear normal.  Left Ear: External ear normal.  Eyes: EOM are normal. Pupils are equal, round, and reactive to light. No scleral icterus.  Neck: Normal range of motion. Neck supple.       NON TENDER  Cardiovascular: Normal rate and regular rhythm.   Respiratory: Effort normal and breath sounds normal. No respiratory distress. He exhibits tenderness.  GI: He exhibits no mass. There is tenderness. There is no rebound and no guarding.       ruq tenderness.  No peritonitis  Genitourinary: Penis normal.  Musculoskeletal: Normal range of motion.       Right hip: He exhibits tenderness.       Cervical back: He exhibits no tenderness.       Thoracic back: He exhibits no tenderness.       Lumbar back: He exhibits no tenderness.  Neurological: He is alert and oriented to person, place, and time.  Skin: Skin is warm and dry.   Psychiatric: His mood appears anxious. His speech is rapid and/or pressured and tangential. He is is hyperactive and slowed. Cognition and memory are impaired. He expresses impulsivity. He exhibits abnormal recent memory.     Assessment/Plan Fall B rib fractures Mild hemoperitoneum probably secondary to grade 1 liver laceration Questionable right acetabular fracture and left superior rami fracture  Not  Much pain on exam  Theses could be old. Thoracic compression fractures age unknown.  Not very tender on exam and these may be chronic as well.  History of ETOH abuse and multiple falls.  Admit to stepdown,  Analgesia,  Have ortho review films,  ETOH withdrawal protocol,  Follow H/H q 6 hours,  Hold plavix.  Miller Edgington A. 08/20/2012, 11:04 PM

## 2012-08-20 NOTE — Consult Note (Signed)
Reason for Consult: possible pelvic fracture after ground level fall Referring Physician: EDP  HPI: Larry West is an 78 y.o. male s/p fall, C/O of right sided chest pain. Denies back pain or hip pain. Poor historian. No SOB. Some right mid abdominal pain. No nausea or vomiting. DENIES NECK PAIN OR HA  Past Medical History  Diagnosis Date  . Hypertension   . Chronic low back pain     right leg pain   . Hip pain     left  . Seizures 2006  . ETOHism   . Cataract   . CAD (coronary artery disease) 2001    s/p PTCI  . PVD (posterior vitreous detachment), left eye   . Anemia     CKD  . Colon polyps   . Chronic kidney disease (CKD), stage III (moderate)   . Hyperlipidemia   . Hyperkalemia     Past Surgical History  Procedure Date  . Cataracts surgery   . Appendectomy 1955  . Gastric outlet obstruction surgery 1988  . Lumbar disc surgery     Family History  Problem Relation Age of Onset  . Cancer Son 32    colon cancer    Social History:  reports that he has never smoked. He does not have any smokeless tobacco history on file. He reports that he drinks about one ounce of alcohol per week. He reports that he does not use illicit drugs.  Allergies:  Allergies  Allergen Reactions  . Darvocet (Propoxyphene-Acetaminophen) Nausea Only  . Penicillins Swelling    Unknown    . Sulfonamide Derivatives Itching     Results for orders placed during the hospital encounter of 08/20/12 (from the past 48 hour(s))  CBC WITH DIFFERENTIAL     Status: Abnormal   Collection Time   08/20/12  7:41 PM      Component Value Range Comment   WBC 9.2  4.0 - 10.5 K/uL    RBC 2.54 (*) 4.22 - 5.81 MIL/uL    Hemoglobin 9.0 (*) 13.0 - 17.0 g/dL    HCT 44.0 (*) 10.2 - 52.0 %    MCV 106.7 (*) 78.0 - 100.0 fL    MCH 35.4 (*) 26.0 - 34.0 pg    MCHC 33.2  30.0 - 36.0 g/dL    RDW 72.5  36.6 - 44.0 %    Platelets 263  150 - 400 K/uL    Neutrophils Relative 76  43 - 77 %    Neutro Abs 7.0  1.7 -  7.7 K/uL    Lymphocytes Relative 19  12 - 46 %    Lymphs Abs 1.8  0.7 - 4.0 K/uL    Monocytes Relative 3  3 - 12 %    Monocytes Absolute 0.3  0.1 - 1.0 K/uL    Eosinophils Relative 1  0 - 5 %    Eosinophils Absolute 0.1  0.0 - 0.7 K/uL    Basophils Relative 1  0 - 1 %    Basophils Absolute 0.1  0.0 - 0.1 K/uL   COMPREHENSIVE METABOLIC PANEL     Status: Abnormal   Collection Time   08/20/12  7:41 PM      Component Value Range Comment   Sodium 132 (*) 135 - 145 mEq/L    Potassium 5.2 (*) 3.5 - 5.1 mEq/L    Chloride 104  96 - 112 mEq/L    CO2 14 (*) 19 - 32 mEq/L    Glucose, Bld 122 (*)  70 - 99 mg/dL    BUN 52 (*) 6 - 23 mg/dL    Creatinine, Ser 4.09 (*) 0.50 - 1.35 mg/dL    Calcium 8.2 (*) 8.4 - 10.5 mg/dL    Total Protein 6.2  6.0 - 8.3 g/dL    Albumin 2.7 (*) 3.5 - 5.2 g/dL    AST 73 (*) 0 - 37 U/L    ALT 39  0 - 53 U/L    Alkaline Phosphatase 92  39 - 117 U/L    Total Bilirubin 0.1 (*) 0.3 - 1.2 mg/dL    GFR calc non Af Amer 28 (*) >90 mL/min    GFR calc Af Amer 32 (*) >90 mL/min   ETHANOL     Status: Abnormal   Collection Time   08/20/12  7:41 PM      Component Value Range Comment   Alcohol, Ethyl (B) 157 (*) 0 - 11 mg/dL   PHENYTOIN LEVEL, TOTAL     Status: Normal   Collection Time   08/20/12  7:41 PM      Component Value Range Comment   Phenytoin Lvl 19.3  10.0 - 20.0 ug/mL   TROPONIN I     Status: Normal   Collection Time   08/20/12  7:42 PM      Component Value Range Comment   Troponin I <0.30  <0.30 ng/mL     Ct Abdomen Pelvis Wo Contrast  08/20/2012  *RADIOLOGY REPORT*  Clinical Data:  Fall.  Ground-level fall.  Right-sided low back pain.  Left-sided chest pain.  Patient on anticoagulation.  CT CHEST, ABDOMEN AND PELVIS WITHOUT CONTRAST  Technique:  Multidetector CT imaging of the chest, abdomen and pelvis was performed following the standard protocol without IV contrast.  Comparison:  03/28/2011.  CT CHEST  Findings:  There is no pneumothorax.   Left lateral  7th through 11th rib fractures are present which are nondisplaced.  Dependent atelectasis is present in the lungs.  Large hiatal hernia. Coronary artery atherosclerosis is present. If office based assessment of coronary risk factors has not been performed, it is now recommended. No pleural or pericardial effusion.  Displaced right 12, 11, 10, 9 and nondisplaced 8th rib fractures are also present.  The scapula appears intact.  Sternum intact.  Age indeterminant but likely chronic T7 and T8 compression fractures are present with between 25 and 50% loss vertebral body height.  No retropulsion.  No paravertebral hematoma or phlegmon identified.  Severe aortic and branch vessel atherosclerosis.  No adenopathy. No airspace disease in the lungs.  Three-vessel aortic arch. Sub-centimeter thyroid nodule(s) noted, too small to characterize, but most likely benign in the absence of known clinical risk factors for thyroid carcinoma.  IMPRESSION: 1.  Multiple nondisplaced left rib fractures with displaced right inferior rib fractures, detailed above. No pneumothorax. 2.  Dependent atelectasis in the lungs. 3.  Age indeterminate  T7 and T8 compression fractures; no retropulsion.  CT ABDOMEN AND PELVIS  Findings:  Hemoperitoneum is present with blood around the margin of the spleen.   There is also hemoperitoneum around the liver and collecting in Morison's pouch compatible with sentinel clot.  Biliary sludge is present within the gallbladder.  Small amount of blood is present in the anatomic pelvis.  Colonic diverticulosis. Aortic atherosclerosis.  Mild renal atrophy.  Renal vascular calcifications are present.  No definite collecting system calculi. Both ureters appear within normal limits.  Left inferior pubic ramus fracture is present.  There is also a  medial left acetabular wall fracture.  Femur appears intact bilaterally.  No displaced sacral fracture is identified.  Severe multilevel lumbar spondylosis.  No acute appearing  lumbar spine compression fractures present.  Urinary bladder appears normal.  IMPRESSION: 1.  Hemoperitoneum.  Based on rib displacement, this probably is secondary to a liver laceration. Probable posterior right hepatic lobe liver laceration (image number 54 series 2, image number 61 series 602) Sentinel clot is present around the right hepatic lobe and in Morison's pouch. 2.  High-density material in the gallbladder compatible with biliary sludge. 3.  Left inferior pubic ramus fracture may be acute or subacute. Nondisplaced left medial acetabular wall fracture.  Critical Value/emergent results were called by telephone at the time of interpretation on 08/20/2012 at 2220 hours to Dr. Manus Gunning, who verbally acknowledged these results.   Original Report Authenticated By: Andreas Newport, M.D.    Ct Head Wo Contrast  08/20/2012  *RADIOLOGY REPORT*  Clinical Data:  Fall.  Trauma.  Neck pain.  CT HEAD WITHOUT CONTRAST CT CERVICAL SPINE WITHOUT CONTRAST  Technique:  Multidetector CT imaging of the head and cervical spine was performed following the standard protocol without intravenous contrast.  Multiplanar CT image reconstructions of the cervical spine were also generated.  Comparison:  08/09/2010.  CT HEAD  Findings: No mass lesion, mass effect, midline shift, hydrocephalus, hemorrhage.  No acute territorial cortical ischemia/infarct. Atrophy and chronic ischemic white matter disease is present.  Calvarium intact.  IMPRESSION: Atrophy and chronic ischemic white matter disease without acute intracranial abnormality.  CT CERVICAL SPINE  Findings: Alignment is unchanged compared to prior. Anterolisthesis of C7 on T1 measuring 3 mm is similar.  2 mm of anterolisthesis of C3-C4 is unchanged as well.  The craniocervical junction appears unchanged.  Cystic changes in the lateral masses of C1 appears similar to prior, without fracture. Chondrocalcinosis of the transverse ligament of the atlas. Multilevel facet arthrosis.   Central stenosis is most pronounced at C5-C6 associated with disc osteophyte complex.  There is step-off deformity in the clivus due to motion artifact.  Prevertebral soft tissues are within normal limits.  Small gas collection is present to the left of the trachea and esophagus at the thoracic inlet, most compatible with an esophageal diverticulum.  Lung apices are within normal limits.  Carotid atherosclerosis.  IMPRESSION: No acute abnormality.  Unchanged severe multilevel degenerative cervical spine disease.   Original Report Authenticated By: Andreas Newport, M.D.    Ct Chest Wo Contrast  08/20/2012  *RADIOLOGY REPORT*  Clinical Data:  Fall.  Ground-level fall.  Right-sided low back pain.  Left-sided chest pain.  Patient on anticoagulation.  CT CHEST, ABDOMEN AND PELVIS WITHOUT CONTRAST  Technique:  Multidetector CT imaging of the chest, abdomen and pelvis was performed following the standard protocol without IV contrast.  Comparison:  03/28/2011.  CT CHEST  Findings:  There is no pneumothorax.   Left lateral 7th through 11th rib fractures are present which are nondisplaced.  Dependent atelectasis is present in the lungs.  Large hiatal hernia. Coronary artery atherosclerosis is present. If office based assessment of coronary risk factors has not been performed, it is now recommended. No pleural or pericardial effusion.  Displaced right 12, 11, 10, 9 and nondisplaced 8th rib fractures are also present.  The scapula appears intact.  Sternum intact.  Age indeterminant but likely chronic T7 and T8 compression fractures are present with between 25 and 50% loss vertebral body height.  No retropulsion.  No paravertebral hematoma or phlegmon  identified.  Severe aortic and branch vessel atherosclerosis.  No adenopathy. No airspace disease in the lungs.  Three-vessel aortic arch. Sub-centimeter thyroid nodule(s) noted, too small to characterize, but most likely benign in the absence of known clinical risk factors for  thyroid carcinoma.  IMPRESSION: 1.  Multiple nondisplaced left rib fractures with displaced right inferior rib fractures, detailed above. No pneumothorax. 2.  Dependent atelectasis in the lungs. 3.  Age indeterminate  T7 and T8 compression fractures; no retropulsion.  CT ABDOMEN AND PELVIS  Findings:  Hemoperitoneum is present with blood around the margin of the spleen.   There is also hemoperitoneum around the liver and collecting in Morison's pouch compatible with sentinel clot.  Biliary sludge is present within the gallbladder.  Small amount of blood is present in the anatomic pelvis.  Colonic diverticulosis. Aortic atherosclerosis.  Mild renal atrophy.  Renal vascular calcifications are present.  No definite collecting system calculi. Both ureters appear within normal limits.  Left inferior pubic ramus fracture is present.  There is also a medial left acetabular wall fracture.  Femur appears intact bilaterally.  No displaced sacral fracture is identified.  Severe multilevel lumbar spondylosis.  No acute appearing lumbar spine compression fractures present.  Urinary bladder appears normal.  IMPRESSION: 1.  Hemoperitoneum.  Based on rib displacement, this probably is secondary to a liver laceration. Probable posterior right hepatic lobe liver laceration (image number 54 series 2, image number 61 series 602) Sentinel clot is present around the right hepatic lobe and in Morison's pouch. 2.  High-density material in the gallbladder compatible with biliary sludge. 3.  Left inferior pubic ramus fracture may be acute or subacute. Nondisplaced left medial acetabular wall fracture.  Critical Value/emergent results were called by telephone at the time of interpretation on 08/20/2012 at 2220 hours to Dr. Manus Gunning, who verbally acknowledged these results.   Original Report Authenticated By: Andreas Newport, M.D.    Ct Cervical Spine Wo Contrast  08/20/2012  *RADIOLOGY REPORT*  Clinical Data:  Fall.  Trauma.  Neck pain.  CT  HEAD WITHOUT CONTRAST CT CERVICAL SPINE WITHOUT CONTRAST  Technique:  Multidetector CT imaging of the head and cervical spine was performed following the standard protocol without intravenous contrast.  Multiplanar CT image reconstructions of the cervical spine were also generated.  Comparison:  08/09/2010.  CT HEAD  Findings: No mass lesion, mass effect, midline shift, hydrocephalus, hemorrhage.  No acute territorial cortical ischemia/infarct. Atrophy and chronic ischemic white matter disease is present.  Calvarium intact.  IMPRESSION: Atrophy and chronic ischemic white matter disease without acute intracranial abnormality.  CT CERVICAL SPINE  Findings: Alignment is unchanged compared to prior. Anterolisthesis of C7 on T1 measuring 3 mm is similar.  2 mm of anterolisthesis of C3-C4 is unchanged as well.  The craniocervical junction appears unchanged.  Cystic changes in the lateral masses of C1 appears similar to prior, without fracture. Chondrocalcinosis of the transverse ligament of the atlas. Multilevel facet arthrosis.  Central stenosis is most pronounced at C5-C6 associated with disc osteophyte complex.  There is step-off deformity in the clivus due to motion artifact.  Prevertebral soft tissues are within normal limits.  Small gas collection is present to the left of the trachea and esophagus at the thoracic inlet, most compatible with an esophageal diverticulum.  Lung apices are within normal limits.  Carotid atherosclerosis.  IMPRESSION: No acute abnormality.  Unchanged severe multilevel degenerative cervical spine disease.   Original Report Authenticated By: Andreas Newport, M.D.  Dg Chest Portable 1 View  08/20/2012  *RADIOLOGY REPORT*  Clinical Data: Fall.  Pain.  PORTABLE CHEST - 1 VIEW  Comparison: PA and lateral chest 09/11/2011 and 08/22/2012.  Findings: Lung volumes are lower than on the comparison studies with basilar atelectasis.  There may be a very small right pleural effusion.  No  pneumothorax identified.  Heart size is upper normal. Hiatal hernia is noted. Severe degenerative change is present about the shoulders.  IMPRESSION:  1.  Possible trace right pleural effusion. 2.  Small hiatal hernia.   Original Report Authenticated By: Holley Dexter, M.D.      Vitals Pulse Rate:  [58-67] 67  (12/20 2230) Resp:  [15-23] 15  (12/20 2230) BP: (123-157)/(63-85) 123/82 mmHg (12/20 2230) SpO2:  [95 %-100 %] 95 % (12/20 2230) There is no height or weight on file to calculate BMI.  Physical Exam: Thin wm, poor historian, c/o r sided lower thoracic pain. Gail/AT, poor dentition, no clavicular crepitance, no gross bone or joint instability upper extremities, pelvis stable, no gross instability BLE's, nonfocal neuro exam, mild discommort with deep palpation left anterior hip, minimal pain with passive hip motion, no crepitance.     Assessment/Plan: Impression:  Bilateral rib fractures Left inferior pubic ramus fracture (subacute) Non displaced left medial acetabular wall fracture (subacute?) Multilevel cervical and lumbar spondylosis Treatment: the pelvic fractures, regardless of acuity, are stable and can be treated conservatively. Patient may be weight bearing as tolerated. F/u PRN  Marleny Faller M 08/20/2012, 11:41 PM

## 2012-08-21 ENCOUNTER — Inpatient Hospital Stay (HOSPITAL_COMMUNITY): Payer: Medicare HMO

## 2012-08-21 ENCOUNTER — Encounter (HOSPITAL_COMMUNITY): Payer: Self-pay | Admitting: General Practice

## 2012-08-21 DIAGNOSIS — M79609 Pain in unspecified limb: Secondary | ICD-10-CM

## 2012-08-21 DIAGNOSIS — S3681XA Injury of peritoneum, initial encounter: Secondary | ICD-10-CM

## 2012-08-21 DIAGNOSIS — S36899A Unspecified injury of other intra-abdominal organs, initial encounter: Secondary | ICD-10-CM | POA: Diagnosis present

## 2012-08-21 DIAGNOSIS — S2243XA Multiple fractures of ribs, bilateral, initial encounter for closed fracture: Secondary | ICD-10-CM | POA: Diagnosis present

## 2012-08-21 DIAGNOSIS — W19XXXA Unspecified fall, initial encounter: Secondary | ICD-10-CM | POA: Diagnosis present

## 2012-08-21 DIAGNOSIS — D62 Acute posthemorrhagic anemia: Secondary | ICD-10-CM | POA: Diagnosis present

## 2012-08-21 LAB — COMPREHENSIVE METABOLIC PANEL
AST: 70 U/L — ABNORMAL HIGH (ref 0–37)
BUN: 54 mg/dL — ABNORMAL HIGH (ref 6–23)
CO2: 16 mEq/L — ABNORMAL LOW (ref 19–32)
Calcium: 8.2 mg/dL — ABNORMAL LOW (ref 8.4–10.5)
Chloride: 105 mEq/L (ref 96–112)
Creatinine, Ser: 2.27 mg/dL — ABNORMAL HIGH (ref 0.50–1.35)
GFR calc Af Amer: 29 mL/min — ABNORMAL LOW (ref >90)
GFR calc non Af Amer: 25 mL/min — ABNORMAL LOW (ref >90)
Glucose, Bld: 139 mg/dL — ABNORMAL HIGH (ref 70–99)
Total Bilirubin: 0.2 mg/dL — ABNORMAL LOW (ref 0.3–1.2)

## 2012-08-21 LAB — CBC
HCT: 27.1 % — ABNORMAL LOW (ref 39.0–52.0)
MCH: 34.9 pg — ABNORMAL HIGH (ref 26.0–34.0)
MCV: 106.3 fL — ABNORMAL HIGH (ref 78.0–100.0)
RBC: 2.55 MIL/uL — ABNORMAL LOW (ref 4.22–5.81)
WBC: 9.4 10*3/uL (ref 4.0–10.5)

## 2012-08-21 LAB — MRSA PCR SCREENING: MRSA by PCR: POSITIVE — AB

## 2012-08-21 MED ORDER — CALCITRIOL 0.25 MCG PO CAPS
0.2500 ug | ORAL_CAPSULE | Freq: Every day | ORAL | Status: DC
  Administered 2012-08-21 – 2012-08-23 (×3): 0.25 ug via ORAL
  Filled 2012-08-21 (×3): qty 1

## 2012-08-21 MED ORDER — PHENYTOIN SODIUM EXTENDED 100 MG PO CAPS
200.0000 mg | ORAL_CAPSULE | Freq: Two times a day (BID) | ORAL | Status: DC
  Administered 2012-08-21 (×2): 200 mg via ORAL
  Filled 2012-08-21 (×3): qty 2

## 2012-08-21 MED ORDER — TRAMADOL HCL 50 MG PO TABS
100.0000 mg | ORAL_TABLET | Freq: Four times a day (QID) | ORAL | Status: DC | PRN
  Administered 2012-08-21 – 2012-08-22 (×2): 100 mg via ORAL
  Filled 2012-08-21 (×4): qty 2

## 2012-08-21 MED ORDER — SODIUM BICARBONATE 650 MG PO TABS
1300.0000 mg | ORAL_TABLET | Freq: Two times a day (BID) | ORAL | Status: DC
  Administered 2012-08-21 – 2012-08-23 (×5): 1300 mg via ORAL
  Filled 2012-08-21 (×8): qty 2

## 2012-08-21 MED ORDER — HYDROMORPHONE HCL PF 1 MG/ML IJ SOLN
0.5000 mg | INTRAMUSCULAR | Status: DC | PRN
  Administered 2012-08-21 – 2012-08-22 (×2): 0.5 mg via INTRAVENOUS
  Filled 2012-08-21 (×3): qty 1

## 2012-08-21 MED ORDER — SPIRITUS FRUMENTI
2.0000 | Freq: Every day | ORAL | Status: DC
  Filled 2012-08-21 (×5): qty 2

## 2012-08-21 MED ORDER — AMLODIPINE BESYLATE 10 MG PO TABS
10.0000 mg | ORAL_TABLET | Freq: Every day | ORAL | Status: DC
  Administered 2012-08-21 – 2012-08-23 (×3): 10 mg via ORAL
  Filled 2012-08-21 (×3): qty 1

## 2012-08-21 MED ORDER — PHENYTOIN SODIUM EXTENDED 100 MG PO CAPS
300.0000 mg | ORAL_CAPSULE | Freq: Every day | ORAL | Status: DC
  Administered 2012-08-22: 300 mg via ORAL
  Filled 2012-08-21 (×2): qty 3

## 2012-08-21 MED ORDER — CHLORHEXIDINE GLUCONATE 0.12 % MT SOLN
15.0000 mL | Freq: Two times a day (BID) | OROMUCOSAL | Status: DC
  Administered 2012-08-22 – 2012-08-23 (×2): 15 mL via OROMUCOSAL
  Filled 2012-08-21 (×5): qty 15

## 2012-08-21 MED ORDER — BENAZEPRIL HCL 10 MG PO TABS
10.0000 mg | ORAL_TABLET | Freq: Every day | ORAL | Status: DC
  Administered 2012-08-21 – 2012-08-23 (×3): 10 mg via ORAL
  Filled 2012-08-21 (×3): qty 1

## 2012-08-21 MED ORDER — ATORVASTATIN CALCIUM 40 MG PO TABS
40.0000 mg | ORAL_TABLET | Freq: Every day | ORAL | Status: DC
  Administered 2012-08-21 – 2012-08-22 (×2): 40 mg via ORAL
  Filled 2012-08-21 (×5): qty 1

## 2012-08-21 MED ORDER — FERROUS SULFATE 325 (65 FE) MG PO TABS
325.0000 mg | ORAL_TABLET | Freq: Two times a day (BID) | ORAL | Status: DC
  Administered 2012-08-21 – 2012-08-23 (×5): 325 mg via ORAL
  Filled 2012-08-21 (×8): qty 1

## 2012-08-21 NOTE — Progress Notes (Signed)
Daughter/POA Angela Burke in [place] [phone] is concerned about Larry West continuing to abuse alcohol when discharged and would like to discuss placement or treatment options.Lurline Idol [facility]

## 2012-08-21 NOTE — Progress Notes (Signed)
Pharmacy note- Phenytoin  78 yo male on phenytoin 200mg  po bid for history of seizures -phenytoin level on 08/20/12 = 19.3 (albumin=2.7, SCr=2.27; corrected phenytoin ~ 30)  Plan -Discussed with Charma Igo PA. Will hold todays dose and restart phenytoin on 12/22 at 300mg  po qhs -Consider re-checking a level in 6-7 days  Thank you, Harland German, Pharm D 08/21/2012 1:20 PM

## 2012-08-21 NOTE — Progress Notes (Signed)
Spoke on phone with patient's son Harvie Heck who agrees with daughter that patient needs some rehab after D/C and has concerns that patient is selling or trading his hydrocodone. Lurline Idol [facility]

## 2012-08-21 NOTE — Progress Notes (Signed)
Report called to Larry West on [address]. Patient prepared for transfer to 5[address]. Lurline Idol [facility]

## 2012-08-21 NOTE — Progress Notes (Signed)
VASCULAR LAB PRELIMINARY  PRELIMINARY  PRELIMINARY  PRELIMINARY  Left lower extremity venous Doppler completed.    Preliminary report:  There is no DVT noted in the left lower extremity.  There is chronic SVT noted in the left greater saphenous vein from calf to mid thigh.  Meloni Hinz, RVT 08/21/2012, 12:21 PM

## 2012-08-21 NOTE — Progress Notes (Cosign Needed)
Patient ID: Larry West, male   DOB: [birth year], 78 y.o.   MRN: [id number]   LOS: 1 day   Subjective: C/o right CW pain but doing ok. Denies withdrawal symptoms when he's stopped drinking.  Objective: Vital signs in last 24 hours: Temp:  [97.6 F (36.4 C)-98 F (36.7 C)] 97.6 F (36.4 C) (12/21 0400) Pulse Rate:  [58-74] 67  (12/21 0700) Resp:  [13-23] 14  (12/21 0700) BP: (123-191)/(58-94) 161/76 mmHg (12/21 0700) SpO2:  [95 %-100 %] 100 % (12/21 0700)    Lab Results:  CBC  Basename 08/21/12 0545 08/20/12 1941  WBC 9.4 9.2  HGB 8.9* 9.0*  HCT 27.1* 27.1*  PLT 238 263   BMET  Basename 08/21/12 0545 08/20/12 1941  NA 136 132*  K 5.6* 5.2*  CL 105 104  CO2 16* 14*  GLUCOSE 139* 122*  BUN 54* 52*  CREATININE 2.27* 2.07*  CALCIUM 8.2* 8.2*    General appearance: alert and no distress Resp: clear to auscultation bilaterally Cardio: regular rate and rhythm GI: Soft, +BS, diffuse TTP Pulses: 2+ and symmetric   Assessment/Plan: Fall Multiple bilateral rib fxs -- Pain control and pulmonary toilet Hemoperitoneum -- Hb stable Pelvic fxs -- WBAT Acute on chronic anemia -- Stable EtOH -- CIWA. Will give 2oz gin qHS Multiple medical problems -- Home meds (except Plavix) FEN -- Reg diet VTE -- SCD's. Gives h/o LLE DVT, will repeat dopplers Dispo -- PT/OT, transfer to floor/tele   Freeman Caldron, PA-C Pager: [phone] General Trauma PA Pager: [phone]   08/21/2012

## 2012-08-22 ENCOUNTER — Inpatient Hospital Stay (HOSPITAL_COMMUNITY): Payer: Medicare HMO

## 2012-08-22 LAB — CBC
MCHC: 33.3 g/dL (ref 30.0–36.0)
MCV: 106 fL — ABNORMAL HIGH (ref 78.0–100.0)
Platelets: 193 10*3/uL (ref 150–400)
RDW: 14.9 % (ref 11.5–15.5)
WBC: 9.8 10*3/uL (ref 4.0–10.5)

## 2012-08-22 LAB — BASIC METABOLIC PANEL
Chloride: 102 mEq/L (ref 96–112)
Creatinine, Ser: 2.51 mg/dL — ABNORMAL HIGH (ref 0.50–1.35)
GFR calc Af Amer: 25 mL/min — ABNORMAL LOW (ref >90)
GFR calc non Af Amer: 22 mL/min — ABNORMAL LOW (ref >90)
Potassium: 5.2 mEq/L — ABNORMAL HIGH (ref 3.5–5.1)

## 2012-08-22 MED ORDER — MECLIZINE HCL 12.5 MG PO TABS
12.5000 mg | ORAL_TABLET | Freq: Three times a day (TID) | ORAL | Status: DC
  Administered 2012-08-22 – 2012-08-23 (×4): 12.5 mg via ORAL
  Filled 2012-08-22 (×9): qty 1

## 2012-08-22 MED ORDER — TRAMADOL HCL 50 MG PO TABS
100.0000 mg | ORAL_TABLET | Freq: Four times a day (QID) | ORAL | Status: DC
  Administered 2012-08-22 – 2012-08-23 (×5): 100 mg via ORAL
  Filled 2012-08-22 (×5): qty 2

## 2012-08-22 MED ORDER — HYDROCODONE-ACETAMINOPHEN 5-325 MG PO TABS
0.5000 | ORAL_TABLET | ORAL | Status: DC | PRN
  Administered 2012-08-22 – 2012-08-23 (×4): 1 via ORAL
  Filled 2012-08-22 (×4): qty 1

## 2012-08-22 MED ORDER — MUPIROCIN 2 % EX OINT
1.0000 "application " | TOPICAL_OINTMENT | Freq: Two times a day (BID) | CUTANEOUS | Status: DC
  Administered 2012-08-22 – 2012-08-23 (×3): 1 via NASAL
  Filled 2012-08-22: qty 22

## 2012-08-22 MED ORDER — SODIUM CHLORIDE 0.45 % IV SOLN
INTRAVENOUS | Status: DC
  Administered 2012-08-22: 100 mL/h via INTRAVENOUS
  Administered 2012-08-22: 23:00:00 via INTRAVENOUS

## 2012-08-22 MED ORDER — CHLORHEXIDINE GLUCONATE CLOTH 2 % EX PADS
6.0000 | MEDICATED_PAD | Freq: Every day | CUTANEOUS | Status: DC
  Administered 2012-08-23: 6 via TOPICAL

## 2012-08-22 NOTE — Evaluation (Signed)
Physical Therapy Evaluation Patient Details Name: Larry West MRN: [id number] DOB: May 30, 1926 Today's Date: 08/22/2012 Time: 0865-7846 PT Time Calculation (min): 16 min  PT Assessment / Plan / Recommendation Clinical Impression  Pt admitted s/p fall with multiple rib fxs and pelvic fx. Upon sitting, pt with N&V therefore pt returned to supine in bed. Will continue reassessment in future sessions. Pt will benefit from skilled PT in the acute care setting in order to maximize functional mobility and safety    PT Assessment  Patient needs continued PT services    Follow Up Recommendations  Home health PT;Supervision/Assistance - 24 hour    Does the patient have the potential to tolerate intense rehabilitation      Barriers to Discharge        Equipment Recommendations  None recommended by PT    Recommendations for Other Services     Frequency Min 4X/week    Precautions / Restrictions Precautions Precautions: Fall Restrictions Weight Bearing Restrictions: No   Pertinent Vitals/Pain Pain 5/10 at pelvis; upon sitting, N&V, returned to supine.       Mobility  Bed Mobility Bed Mobility: Supine to Sit;Sitting - Scoot to Edge of Bed;Sit to Supine Supine to Sit: 4: Min assist Sitting - Scoot to Edge of Bed: 4: Min guard Sit to Supine: 4: Min assist Details for Bed Mobility Assistance: Min assist for support of LEs and through trunk. Cues for sequencing Transfers Transfers: Not assessed (pt nauseus upon sitting)    Shoulder Instructions     Exercises     PT Diagnosis: Difficulty walking;Acute pain  PT Problem List: Decreased strength;Decreased activity tolerance;Decreased mobility;Decreased knowledge of use of DME;Decreased safety awareness;Decreased knowledge of precautions;Pain PT Treatment Interventions: DME instruction;Gait training;Stair training;Functional mobility training;Therapeutic activities;Patient/family education   PT Goals Acute Rehab PT Goals PT Goal  Formulation: With patient Time For Goal Achievement: 08/29/12 Potential to Achieve Goals: Fair Pt will go Supine/Side to Sit: with modified independence PT Goal: Supine/Side to Sit - Progress: Goal set today Pt will go Sit to Supine/Side: with modified independence PT Goal: Sit to Supine/Side - Progress: Goal set today Pt will go Sit to Stand: with supervision PT Goal: Sit to Stand - Progress: Goal set today Pt will go Stand to Sit: with supervision PT Goal: Stand to Sit - Progress: Goal set today Pt will Transfer Bed to Chair/Chair to Bed: with supervision PT Transfer Goal: Bed to Chair/Chair to Bed - Progress: Goal set today Pt will Ambulate: 51 - 150 feet;with supervision;with least restrictive assistive device PT Goal: Ambulate - Progress: Goal set today Pt will Go Up / Down Stairs: 6-9 stairs;with supervision;with rail(s) PT Goal: Up/Down Stairs - Progress: Goal set today  Visit Information  Last PT Received On: 08/22/12    Subjective Data  Subjective: "I don't feel well" Patient Stated Goal: to go home   Prior Functioning  Home Living Lives With: Other (Comment) (caretaker) Available Help at Discharge: Family;Available 24 hours/day Type of Home: Mobile home Home Access: Stairs to enter Entrance Stairs-Number of Steps: 7 Entrance Stairs-Rails: Left Home Layout: One level Bathroom Shower/Tub: Tub/shower unit;Curtain Bathroom Toilet: Handicapped height Bathroom Accessibility: Yes How Accessible: Accessible via walker Home Adaptive Equipment: Bedside commode/3-in-1;Raised toilet seat with rails;Tub transfer bench;Walker - rolling;Grab bars in shower;Grab bars around toilet Prior Function Level of Independence: Independent Able to Take Stairs?: Yes Vocation: Retired Musician: No difficulties Dominant Hand: Right    Cognition  Overall Cognitive Status: Appears within functional limits for tasks assessed/performed  Arousal/Alertness:  Awake/alert Orientation Level: Appears intact for tasks assessed Behavior During Session: [facility] for tasks performed    Extremity/Trunk Assessment Right Lower Extremity Assessment RLE ROM/Strength/Tone: Unable to fully assess;Due to pain Left Lower Extremity Assessment LLE ROM/Strength/Tone: Due to pain;Unable to fully assess   Balance    End of Session PT - End of Session Activity Tolerance: Treatment limited secondary to medical complications (Comment) (N&V) Patient left: in bed;with call bell/phone within reach Nurse Communication: Mobility status;Patient requests pain meds  GP     Milana Kidney 08/22/2012, 2:24 PM  08/22/2012 Milana Kidney DPT PAGER: [phone] OFFICE: [phone]

## 2012-08-22 NOTE — Progress Notes (Signed)
OT Cancellation Note  Patient Details Name: Larry West MRN: [id number] DOB: 03-17-1926   Cancelled Treatment:    Reason Eval/Treat Not Completed: Medical issues which prohibited therapy (pt not feeling well)  [facility] Ricky Gallery, OTR/L  [phone] 08/22/2012 08/22/2012, 2:17 PM

## 2012-08-22 NOTE — Progress Notes (Signed)
Patient ID: Larry West, male   DOB: 1926-02-08, 78 y.o.   MRN: [id number]   LOS: 2 days   Subjective: Vertigo bothering him. Pain not controlled with tramadol.  Objective: Vital signs in last 24 hours: Temp:  [97.4 F (36.3 C)-98 F (36.7 C)] 97.4 F (36.3 C) (12/21 1700) Pulse Rate:  [62] 62  (12/21 1700) Resp:  [18] 18  (12/21 1700) BP: (123)/(58) 123/58 mmHg (12/21 1700) SpO2:  [98 %] 98 % (12/21 1700) Last BM Date: 08/21/12   Lab Results:  CBC  Basename 08/22/12 0510 08/21/12 0545  WBC 9.8 9.4  HGB 7.7* 8.9*  HCT 23.1* 27.1*  PLT 193 238   BMET  Basename 08/22/12 0510 08/21/12 0545  NA 133* 136  K 5.2* 5.6*  CL 102 105  CO2 20 16*  GLUCOSE 128* 139*  BUN 58* 54*  CREATININE 2.51* 2.27*  CALCIUM 8.4 8.2*    Radiology PORTABLE CHEST - 1 VIEW  Comparison: 08/21/2012.  Findings: Low volume chest, with left greater than right basilar  atelectasis. Cardiopericardial silhouette appears unchanged  allowing for lower lung volumes. No definite airspace  consolidation. Previously demonstrated rib fractures are  suboptimally visualized.  IMPRESSION:  Low volume chest with increasing left basilar atelectasis. No  pneumothorax.  Original Report Authenticated By: Andreas Newport, M.D.   General appearance: alert and no distress Resp: clear to auscultation bilaterally Cardio: regular rate and rhythm GI: Soft, +BS, diffuse mod TTP   Assessment/Plan: Fall  Multiple bilateral rib fxs -- Will schedule tramadol and add Norco. Needs IS. Hemoperitoneum -- Hb decreased, monitor Pelvic fxs -- WBAT  Acute on chronic anemia -- As above EtOH -- CIWA. Will give 2oz gin qHS  Multiple medical problems -- Home meds (except Plavix). Will give additional IVF with worsening renal fxn. FEN -- Reg diet  VTE -- SCD's. Gives h/o LLE DVT, will repeat dopplers  Dispo -- PT/OT    Freeman Caldron, PA-C Pager: [phone] General Trauma PA Pager: [phone]    08/22/2012

## 2012-08-22 NOTE — Progress Notes (Signed)
Follow hgb.  Will need PT/OT.

## 2012-08-23 ENCOUNTER — Inpatient Hospital Stay (HOSPITAL_COMMUNITY): Payer: Medicare HMO

## 2012-08-23 LAB — BASIC METABOLIC PANEL
BUN: 54 mg/dL — ABNORMAL HIGH (ref 6–23)
Chloride: 100 mEq/L (ref 96–112)
GFR calc Af Amer: 29 mL/min — ABNORMAL LOW (ref >90)
GFR calc non Af Amer: 25 mL/min — ABNORMAL LOW (ref >90)
Potassium: 4.6 mEq/L (ref 3.5–5.1)
Sodium: 130 mEq/L — ABNORMAL LOW (ref 135–145)

## 2012-08-23 LAB — CBC
HCT: 22.3 % — ABNORMAL LOW (ref 39.0–52.0)
MCHC: 34.1 g/dL (ref 30.0–36.0)
RDW: 14.7 % (ref 11.5–15.5)
WBC: 6.7 10*3/uL (ref 4.0–10.5)

## 2012-08-23 MED ORDER — TRAMADOL HCL 50 MG PO TABS: 100.0000 mg | ORAL_TABLET | Freq: Four times a day (QID) | ORAL | Status: DC

## 2012-08-23 MED ORDER — HYDROCODONE-ACETAMINOPHEN 5-325 MG PO TABS: 0.5000 | ORAL_TABLET | ORAL | Status: DC | PRN

## 2012-08-23 NOTE — Progress Notes (Signed)
Patient ID: Larry West, male   DOB: 1926-07-16, 78 y.o.   MRN: [id number]   LOS: 3 days   Subjective: No new c/o. Pain control is better.  Objective: Vital signs in last 24 hours: Temp:  [97.7 F (36.5 C)-98 F (36.7 C)] 97.7 F (36.5 C) (12/23 0647) Pulse Rate:  [72-75] 72  (12/23 0647) Resp:  [18] 18  (12/23 0647) BP: (131-140)/(55-59) 140/55 mmHg (12/23 0647) SpO2:  [99 %] 99 % (12/23 0647) Last BM Date: 08/21/12   IS:   Lab Results:  CBC  Basename 08/23/12 0605 08/22/12 0510  WBC 6.7 9.8  HGB 7.6* 7.7*  HCT 22.3* 23.1*  PLT 188 193   BMET  Basename 08/23/12 0605 08/22/12 0510  NA 130* 133*  K 4.6 5.2*  CL 100 102  CO2 18* 20  GLUCOSE 103* 128*  BUN 54* 58*  CREATININE 2.28* 2.51*  CALCIUM 8.4 8.4    Radiology PORTABLE CHEST - 1 VIEW  Comparison: 08/22/2012  Findings: There are mildly displaced right lower rib fractures.  Overall, there are low lung volumes and evidence for bibasilar  atelectasis. Slightly increased right basilar densities. There is  no definite pneumothorax. Heart and mediastinum are stable.  Elevated humeral heads are suggestive for underlying rotator cuff  disease with marked degenerative changes in the left humeral head.  IMPRESSION:  Low lung volumes with bibasilar atelectasis.  Right rib fractures without a definite pneumothorax.  Original Report Authenticated By: Richarda Overlie, M.D.   General appearance: alert and no distress Resp: clear to auscultation bilaterally Cardio: regular rate and rhythm GI: normal findings: bowel sounds normal and soft   Assessment/Plan: Fall  Multiple bilateral rib fxs -- Pulmonary toilet Hemoperitoneum -- Stable Pelvic fxs -- WBAT  Acute on chronic anemia -- As above  EtOH -- CIWA. Will give 2oz gin qHS  Multiple medical problems -- Home meds (except Plavix). Renal fxn improved. FEN -- Reg diet  VTE -- SCD's. Gives h/o LLE DVT, no DVT but SVT noted. Dispo -- PT/OT. Likely home today  if PT clears.    Freeman Caldron, PA-C Pager: [phone] General Trauma PA Pager: [phone]   08/23/2012

## 2012-08-23 NOTE — Progress Notes (Signed)
[address].  This is the address for [place] where pt is discharging to. Phone number is in EPIC. HHPT arranged with [facility] by pt choice.

## 2012-08-23 NOTE — Progress Notes (Signed)
Patient may be able to go home later today.    This patient has been seen and I agree with the findings and treatment plan.  Marta Lamas. Gae Bon, MD, FACS [phone] (pager) [phone] (direct pager) Trauma Surgeon
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
## 2012-08-23 NOTE — Progress Notes (Signed)
Occupational Therapy Evaluation Patient Details Name: Larry West MRN: [id number] DOB: [birth year] Today's Date: 08/23/2012 Time: 4098-1191 OT Time Calculation (min): 18 min  OT Assessment / Plan / Recommendation Clinical Impression  78 yo male s/p fall with pelvic fx and vertigo that could benefit from outpatient or hhot for d/c planning. OT to follow acutely. Pt is moderate fall risk.    OT Assessment  Patient needs continued OT Services    Follow Up Recommendations  Outpatient OT;Home health OT (HHOT then progress to outpatient)    Barriers to Discharge      Equipment Recommendations  None recommended by OT    Recommendations for Other Services    Frequency  Min 2X/week    Precautions / Restrictions Precautions Precautions: Fall Restrictions Weight Bearing Restrictions: No   Pertinent Vitals/Pain No pain reported Mild dizziness reported  Pt states "its not bad but its there"    ADL  Grooming: Wash/dry hands;Supervision/safety (pt abandoned RW and with slight sway to static standing ) Where Assessed - Grooming: Unsupported standing (pt using ankle strategies to maintain balance at sink) Toilet Transfer: Modified independent Toilet Transfer Method: Sit to Barista: Regular height toilet;Grab bars Toileting - Clothing Manipulation and Hygiene: Modified independent Where Assessed - Toileting Clothing Manipulation and Hygiene: Sit to stand from 3-in-1 or toilet Equipment Used: Gait belt;Rolling walker Transfers/Ambulation Related to ADLs: Pt completed transfer sit<>stand with correct hand placement . Pt abandoned Rw during session x2. Pt educated on fall risk and need to keep RW close to patient. pt admits to using RW only about 75% of the time at home. pt educated on vertigo and dizziness causing increased fall risk and need for RW. Pt agreeable to use RW. Pt educated on backing up to bed safely ADL Comments: pt is able to reach LB for LB dressing  and complete basic transfer. Pt demonstrates balance deficits and could benefit from skilled Ot outpatient or home health.     OT Diagnosis: Generalized weakness;Acute pain  OT Problem List: Decreased strength;Decreased activity tolerance;Impaired balance (sitting and/or standing);Decreased safety awareness;Decreased knowledge of use of DME or AE;Decreased knowledge of precautions OT Treatment Interventions: Self-care/ADL training;DME and/or AE instruction;Therapeutic activities;Patient/family education;Balance training   OT Goals Acute Rehab OT Goals OT Goal Formulation: With patient Time For Goal Achievement: 09/06/12 Potential to Achieve Goals: Good ADL Goals Pt Will Perform Grooming: with modified independence;Standing at sink (100% correct RW use) ADL Goal: Grooming - Progress: Goal set today Pt Will Perform Upper Body Bathing: with modified independence;Standing at sink ADL Goal: Upper Body Bathing - Progress: Goal set today Pt Will Perform Lower Body Bathing: with modified independence;Standing at sink ADL Goal: Lower Body Bathing - Progress: Goal set today Miscellaneous OT Goals Miscellaneous OT Goal #1: Pt will complete tug with less than moderate fall risk to indicate decrease fall risk with mobility during adls OT Goal: Miscellaneous Goal #1 - Progress: Goal set today  Visit Information  Last OT Received On: 08/23/12    Subjective Data  Subjective: "if its alright with you, I'd like to stay one more night. You see I still go a little bit of dizziness now not bad but a little"- pt advocating for an additional night stay due to fear of falling Patient Stated Goal: to go home tomorrow   Prior Functioning     Home Living Lives With: Other (Comment) (caretaker) Available Help at Discharge: Family;Available 24 hours/day Type of Home: Mobile home Home Access: Stairs to enter Entrance  Stairs-Number of Steps: 7 Entrance Stairs-Rails: Left Home Layout: One level Bathroom  Shower/Tub: Tub/shower unit;Curtain Bathroom Toilet: Handicapped height Bathroom Accessibility: Yes How Accessible: Accessible via walker Home Adaptive Equipment: Bedside commode/3-in-1;Raised toilet seat with rails;Tub transfer bench;Walker - rolling;Grab bars in shower;Grab bars around toilet Prior Function Level of Independence: Independent Able to Take Stairs?: Yes Vocation: Retired Musician: No difficulties Dominant Hand: Right         Vision/Perception     Cognition  Overall Cognitive Status: Appears within functional limits for tasks assessed/performed Arousal/Alertness: Awake/alert Orientation Level: Appears intact for tasks assessed Behavior During Session: [facility] for tasks performed    Extremity/Trunk Assessment Trunk Assessment Trunk Assessment: Normal     Mobility Bed Mobility Bed Mobility: Supine to Sit;Sitting - Scoot to Edge of Bed;Sit to Supine Supine to Sit: 6: Modified independent (Device/Increase time);HOB flat Sitting - Scoot to Edge of Bed: 6: Modified independent (Device/Increase time) Sit to Supine: 4: Min assist;HOB flat (pt requesting help with BIL LE back into bed) Details for Bed Mobility Assistance: pt is able to lift bil le and move toward EOB. Pt requesting help but needing less than 25% help Transfers Transfers: Sit to Stand;Stand to Sit Sit to Stand: 5: Supervision;With upper extremity assist;From bed Stand to Sit: 5: Supervision;With upper extremity assist;To bed Details for Transfer Assistance: educated on RW safety for mobility     Shoulder Instructions     Exercise     Balance     End of Session OT - End of Session Activity Tolerance: Patient tolerated treatment well Patient left: in bed;with call bell/phone within reach Nurse Communication: Mobility status;Precautions  GO     Lucile Shutters 08/23/2012, 8:42 AM Pager: [phone]

## 2012-08-23 NOTE — Progress Notes (Signed)
Patient discharged in stable condition via wheelchair. Discharge instructions and prescriptions were explained. 

## 2012-08-23 NOTE — Discharge Summary (Signed)
Okay for the patient to go home.  This patient has been seen and I agree with the findings and treatment plan.  Marta Lamas. Gae Bon, MD, FACS [phone] (pager) [phone] (direct pager) Trauma Surgeon

## 2012-08-23 NOTE — Discharge Summary (Signed)
Physician Discharge Summary  Patient ID: Larry West MRN: [id number] DOB/AGE: 76-Jan-1927 76 y.o.  Admit date: 08/20/2012 Discharge date: 08/23/2012  Discharge Diagnoses Patient Active Problem List   Diagnosis Date Noted  . Fall 08/21/2012  . Multiple fractures of ribs of both sides 08/21/2012  . Traumatic hemoperitoneum 08/21/2012  . Multiple pelvic fractures 08/21/2012  . Acute blood loss anemia 08/21/2012  . Chronic kidney disease (CKD), stage III (moderate)   . Anemia   . HTN (hypertension) 02/19/2011  . Chronic low back pain 02/19/2011  . Seizures 02/19/2011  . ETOHism 02/19/2011  . PERIPHERAL VASCULAR DISEASE 05/15/2010  . PERSONAL HX COLONIC POLYPS 05/15/2010    Consultants Dr. Francena Hanly for orthopedic surgery   Procedures None   HPI: Kawika fell against his bed the day of admission. The history difficult to get as he had been drinking. He complained of right sided chest pain without shortness of breath. He denied back pain or hip pain. He also had some right mid-abdominal pain. His workup showed the multiple rib fractures as well as a hemoperitoneum of uncertain etiology. This was thought likely to be due to an occult liver laceration. He also had some pelvic fractures of indeterminate age. He was admitted for pain control and pulmonary toilet. Orthopedic surgery was consulted for the possible pelvic fractures.   Hospital Course: Orthopedic surgery could not definitively determine whether the fractures were acute or old but remarked that he could bear weight as tolerated on them regardless of age. We initially tried to control his pain on tramadol but this was ineffective so he was placed to scheduled tramadol with hydrocodone as needed. This worked well. He was mobilized with physical and occupational therapies and was cleared to return home with 24-hour supervision which he says he has. He had some acute on chronic anemia that stabilized by the time of discharge.  His renal function also worsened while he was here in the hospital but was improving at the time of discharge. He was discharged home in stable condition.  He is instructed to follow up with his primary care provider within the next 2-4 weeks for ongoing management of his anemia and renal function. He should remain off of blood thinners for 1 month but is ok to restart them at that time. Because the patient gave a history of a blood clot in his left leg, venous dopplers were performed and demonstrated a superficial thrombus in the greater saphenous vein but no DVT.      Medication List     As of 08/23/2012  8:44 AM    STOP taking these medications         clopidogrel 75 MG tablet   Commonly known as: PLAVIX      HYDROcodone-acetaminophen 7.5-500 MG per tablet   Commonly known as: LORTAB      TAKE these medications         amLODipine 10 MG tablet   Commonly known as: NORVASC   Take 10 mg by mouth daily.      atorvastatin 40 MG tablet   Commonly known as: LIPITOR   Take 40 mg by mouth at bedtime.      benazepril 10 MG tablet   Commonly known as: LOTENSIN   Take 1 tablet by mouth daily.      calcitRIOL 0.25 MCG capsule   Commonly known as: ROCALTROL   Take 0.25 mcg by mouth daily.      darbepoetin 200 MCG/0.4ML Soln   Commonly  known as: ARANESP   Inject 200 mcg into the skin every 28 (twenty-eight) days. Next dose due Monday      diazepam 5 MG tablet   Commonly known as: VALIUM   Take 5 mg by mouth. prn      ferrous sulfate 325 (65 FE) MG tablet   Take 325 mg by mouth 2 (two) times daily.      HYDROcodone-acetaminophen 5-325 MG per tablet   Commonly known as: NORCO/VICODIN   Take 0.5-2 tablets by mouth every 4 (four) hours as needed (1/2 tab for mild pain, 1 tab for moderate pain, 2 tabs for severe pain).      meclizine 12.5 MG tablet   Commonly known as: ANTIVERT   Take 12.5 mg by mouth 3 (three) times daily.      phenytoin 100 MG ER capsule   Commonly known  as: DILANTIN   Take 200 mg by mouth 2 (two) times daily.      sodium bicarbonate 650 MG tablet   Take 1,300 mg by mouth 2 (two) times daily.      traMADol 50 MG tablet   Commonly known as: ULTRAM   Take 2 tablets (100 mg total) by mouth every 6 (six) hours.             Follow-up Information    Follow up with [facility] [name], MD. Schedule an appointment as soon as possible for a visit in 2 weeks.   Contact information:   [address] [place] [address] [phone]       Call [facility]. (As needed)    Contact information:   [address] [place] [address] [phone]          Signed: Freeman Caldron, PA-C Pager: [phone] General Trauma PA Pager: [phone]  08/23/2012, 8:44 AM

## 2012-08-31 ENCOUNTER — Telehealth (HOSPITAL_COMMUNITY): Payer: Self-pay | Admitting: Orthopedic Surgery

## 2012-08-31 NOTE — Telephone Encounter (Signed)
Spoke with Tresa Endo (dtr?) who relayed some concerns. Firstly she said that he was having some hallucinations that she was worried was caused by the tramadol. What she described sounded more like delirium. He also reportedly did not get (or lost) his hydrocodone rx. His activity has decreased dramatically as well. With the delirium I suggested she take him to UC or ED for evaluation since she couldn't get in to see PCP until next week and I couldn't see him until Thursday. I called in hydrocodone rx as written. She will call Thursday am if she still has questions.

## 2012-09-16 ENCOUNTER — Other Ambulatory Visit (HOSPITAL_BASED_OUTPATIENT_CLINIC_OR_DEPARTMENT_OTHER): Payer: Medicare HMO | Admitting: Lab

## 2012-09-16 ENCOUNTER — Telehealth: Payer: Self-pay | Admitting: Oncology

## 2012-09-16 ENCOUNTER — Encounter: Payer: Self-pay | Admitting: Oncology

## 2012-09-16 ENCOUNTER — Ambulatory Visit (HOSPITAL_BASED_OUTPATIENT_CLINIC_OR_DEPARTMENT_OTHER): Payer: Medicare HMO | Admitting: Oncology

## 2012-09-16 ENCOUNTER — Ambulatory Visit: Payer: Medicare FFS

## 2012-09-16 VITALS — BP 139/78 | HR 73 | Temp 97.0°F | Resp 18 | Ht 67.0 in | Wt 150.9 lb

## 2012-09-16 DIAGNOSIS — G8929 Other chronic pain: Secondary | ICD-10-CM

## 2012-09-16 DIAGNOSIS — D649 Anemia, unspecified: Secondary | ICD-10-CM

## 2012-09-16 DIAGNOSIS — N183 Chronic kidney disease, stage 3 unspecified: Secondary | ICD-10-CM

## 2012-09-16 DIAGNOSIS — F1020 Alcohol dependence, uncomplicated: Secondary | ICD-10-CM

## 2012-09-16 DIAGNOSIS — I129 Hypertensive chronic kidney disease with stage 1 through stage 4 chronic kidney disease, or unspecified chronic kidney disease: Secondary | ICD-10-CM

## 2012-09-16 DIAGNOSIS — D539 Nutritional anemia, unspecified: Secondary | ICD-10-CM

## 2012-09-16 DIAGNOSIS — M545 Low back pain, unspecified: Secondary | ICD-10-CM

## 2012-09-16 DIAGNOSIS — M898X9 Other specified disorders of bone, unspecified site: Secondary | ICD-10-CM

## 2012-09-16 DIAGNOSIS — I739 Peripheral vascular disease, unspecified: Secondary | ICD-10-CM

## 2012-09-16 DIAGNOSIS — Z860100 Personal history of colon polyps, unspecified: Secondary | ICD-10-CM

## 2012-09-16 DIAGNOSIS — R569 Unspecified convulsions: Secondary | ICD-10-CM

## 2012-09-16 DIAGNOSIS — I10 Essential (primary) hypertension: Secondary | ICD-10-CM

## 2012-09-16 HISTORY — DX: Other specified disorders of bone, unspecified site: M89.8X9

## 2012-09-16 LAB — CBC WITH DIFFERENTIAL/PLATELET
Basophils Absolute: 0.1 10*3/uL (ref 0.0–0.1)
Eosinophils Absolute: 0.1 10*3/uL (ref 0.0–0.5)
HGB: 8.9 g/dL — ABNORMAL LOW (ref 13.0–17.1)
LYMPH%: 24.8 % (ref 14.0–49.0)
MCV: 104.3 fL — ABNORMAL HIGH (ref 79.3–98.0)
MONO%: 6.8 % (ref 0.0–14.0)
NEUT#: 4 10*3/uL (ref 1.5–6.5)
Platelets: 344 10*3/uL (ref 140–400)

## 2012-09-16 LAB — BUN AND CREATININE (CC13): Creatinine: 2.4 mg/dL — ABNORMAL HIGH (ref 0.7–1.3)

## 2012-09-16 MED ORDER — HYDROCODONE-ACETAMINOPHEN 5-325 MG PO TABS: 1.0000 | ORAL_TABLET | Freq: Four times a day (QID) | ORAL | Status: DC | PRN

## 2012-09-16 MED ORDER — DARBEPOETIN ALFA-POLYSORBATE 300 MCG/0.6ML IJ SOLN
300.0000 ug | Freq: Once | INTRAMUSCULAR | Status: DC
  Filled 2012-09-16: qty 0.6

## 2012-09-16 NOTE — Progress Notes (Signed)
[facility]  Telephone:([phone]) [phone] Fax:([phone]   OFFICE PROGRESS NOTE   Cc:  PICKARD,[name], MD  DIAGNOSIS:  Anemia of chronic kidney disease  CURRENT THERAPY:  Monthly Aranesp for Hgb <11.   INTERVAL HISTORY: Larry West 79 y.o. male returns for regular follow up with a family friend.  He fell when he tripped over electronic blankets and fell on the end of a bad. He sustained multiple fractures of ribs, traumatic hemoperitoneum, multiple pelvic fractures. He was seen by orthopedics and was treated conservatively and was discharged. He still has severe diffuse bone pain where he has fractures.  He needs a walker to ambulate at home.  He ran out of Lortab and has not had a chance to ask his PCP for refill.  His friend reported that he was drinking EtOH before the fall.  However, he has since moved in with the friend and has been EtOH-free. He does have generalized fatigue; SOB; and DOE without edema, orthopnea, PND.    Patient denies fever, headache, visual changes, confusion, drenching night sweats, palpable lymph node swelling, mucositis, odynophagia, dysphagia, nausea vomiting, jaundice, productive cough, gum bleeding, epistaxis, hematemesis, hemoptysis, abdominal pain, abdominal swelling, early satiety, melena, hematochezia, hematuria, skin rash, spontaneous bleeding,heat or cold intolerance, bowel bladder incontinence, focal motor weakness, paresthesia, depression.      Past Medical History  Diagnosis Date  . Hypertension   . Chronic low back pain     right leg pain   . Hip pain     left  . Seizures 2006  . ETOHism   . Cataract   . CAD (coronary artery disease) 2001    s/p PTCI  . PVD (posterior vitreous detachment), left eye   . Anemia     CKD  . Colon polyps   . Chronic kidney disease (CKD), stage III (moderate)   . Hyperlipidemia   . Hyperkalemia   . Bone pain 09/16/2012    Past Surgical History  Procedure Date  . Cataracts surgery    . Appendectomy 1955  . Gastric outlet obstruction surgery 1988  . Lumbar disc surgery     Current Outpatient Prescriptions  Medication Sig Dispense Refill  . atorvastatin (LIPITOR) 40 MG tablet Take 40 mg by mouth at bedtime.       . benazepril (LOTENSIN) 10 MG tablet Take 1 tablet by mouth daily.      . calcitRIOL (ROCALTROL) 0.25 MCG capsule Take 0.25 mcg by mouth daily.       . darbepoetin (ARANESP) 200 MCG/0.4ML SOLN Inject 200 mcg into the skin every 28 (twenty-eight) days. Next dose due Monday      . ferrous sulfate 325 (65 FE) MG tablet Take 325 mg by mouth 2 (two) times daily.       . meclizine (ANTIVERT) 12.5 MG tablet Take 12.5 mg by mouth 3 (three) times daily.       . phenytoin (DILANTIN) 100 MG ER capsule Take 200 mg by mouth 2 (two) times daily.       . sodium bicarbonate 650 MG tablet Take 1,300 mg by mouth 2 (two) times daily.       Marland Kitchen HYDROcodone-acetaminophen (NORCO/VICODIN) 5-325 MG per tablet Take 1 tablet by mouth every 6 (six) hours as needed for pain.  90 tablet  0   Current Facility-Administered Medications  Medication Dose Route Frequency Provider Last Rate Last Dose  . darbepoetin (ARANESP) injection 300 mcg  300 mcg Subcutaneous Once Aija Scarfo T Macklyn Glandon,  MD        ALLERGIES:  is allergic to darvocet; penicillins; and sulfonamide derivatives.  REVIEW OF SYSTEMS:  The rest of the 14-point review of system was negative.   Filed Vitals:   09/16/12 0858  BP: 139/78  Pulse: 73  Temp: 97 F (36.1 C)  Resp: 18   Wt Readings from Last 3 Encounters:  09/16/12 150 lb 14.4 oz (68.448 kg)  07/26/12 155 lb (70.308 kg)  05/20/12 156 lb 6.4 oz (70.943 kg)   ECOG Performance status: 2  PHYSICAL EXAMINATION:   General:  Tired appearing, unkempt man, in no acute distress.  Eyes:  no scleral icterus.  ENT:  There were no oropharyngeal lesions.  Neck was without thyromegaly.  Lymphatics:  Negative cervical, supraclavicular or axillary adenopathy.  Respiratory: lungs were clear  bilaterally without wheezing or crackles.  Cardiovascular:  Regular rate and rhythm, S1/S2, without murmur, rub or gallop.  There was no pedal edema.  GI:  abdomen was soft, flat, nontender, nondistended, without organomegaly.  Muscoloskeletal:  no spinal tenderness of palpation of vertebral spine.  Mild discomfort of bilateral anterior ribs.   Skin exam was without echymosis, petichae.  Neuro exam was nonfocal.  Patient was able to get on and off exam table with minimal assistance. He needed his wheelchair.  Patient was alerted and oriented.  Attention was good.   Language was appropriate.  Mood was normal without depression.  Speech was not pressured.  Thought content was not tangential.      LABORATORY/RADIOLOGY DATA:  Lab Results  Component Value Date   WBC 6.1 09/16/2012   HGB 8.9* 09/16/2012   HCT 25.5* 09/16/2012   PLT 344 09/16/2012   GLUCOSE 103* 08/23/2012   ALKPHOS 97 08/21/2012   ALT 43 08/21/2012   AST 70* 08/21/2012   NA 130* 08/23/2012   K 4.6 08/23/2012   CL 100 08/23/2012   CREATININE 2.4* 09/16/2012   BUN 59.0* 09/16/2012   CO2 18* 08/23/2012     ASSESSMENT AND PLAN:   1. Hypertension: He is on benazepril.  2. Chronic kidney disease, stage IIIB: Due to most likely HTN. He f/u with nephrology. Past testing was neg for myeloma.  3. CAD history: He is on Plavix and atorvastatin. His Plavix was discontinued given recent hemoperitoneum.  4. Hyperlipidemia: He is on atorvastatin per Dr. Tanya Nones.  5. Seizure: He is on Dilantin per Dr. Tanya Nones.  6. History of EtOH: He claims that he is not drinking alcohol anymore. He denies DT or seizure. 7. Chronic macrocytic anemia:  - due to most likely CKD.  Extensive work up in the past for VitB12/iron/SPEP were negative.  - He has been receiving Aranesp without significant improvement of his Hgb compared to his reported response in the past.   - Recommendation:  Continue Aranesp injection for now since it worked in the past.  However,  serious consideration for bone marrow biopsy to rule out bone marrow failure status.  He expressed informed understanding and agreed to go ahead with the procedure on 09/28/12.  8. Follow up: monthly CBC and Aranesp injection for Hgb <11. He has follow up in about 4 months.       The length of time of the face-to-face encounter was 25 minutes. More than 50% of time was spent counseling and coordination of care.

## 2012-09-16 NOTE — Telephone Encounter (Signed)
Gave pt appt for lab, injection q 4 weeks then see ML on May 2014

## 2012-09-16 NOTE — Progress Notes (Signed)
For his diffuse bone pain from recent fractures, I prescribed an emergency supply of Lortab 5/325mg  (#90 without refill) which worked for him upon d/c.  I advised him to follow up with his PCP for further management.

## 2012-09-16 NOTE — Patient Instructions (Addendum)
1.  Issue:  Anemia due to chronic kidney disease or alcohol use.  2.  Treatment:  Aranesp injection every 4 weeks for Hgb <11.    3.  Status:  No significant improvement since 12/2011.  Query if there is bone marrow failure issue (such as aplasia).  4.  Recommendation:  Continue Aranesp injection for now since it worked in the past.  However, serious consideration for bone marrow biopsy to rule out bone marrow failure status.

## 2012-09-28 ENCOUNTER — Other Ambulatory Visit (HOSPITAL_COMMUNITY)
Admission: RE | Admit: 2012-09-28 | Discharge: 2012-09-28 | Disposition: A | Discharge status: Home / Self Care | Payer: Medicare HMO | Source: Ambulatory Visit | Attending: Oncology | Admitting: Oncology

## 2012-09-28 ENCOUNTER — Ambulatory Visit (HOSPITAL_BASED_OUTPATIENT_CLINIC_OR_DEPARTMENT_OTHER): Payer: Medicare HMO | Admitting: Oncology

## 2012-09-28 ENCOUNTER — Other Ambulatory Visit: Payer: Self-pay | Admitting: Oncology

## 2012-09-28 VITALS — BP 163/79 | HR 56 | Temp 98.3°F

## 2012-09-28 DIAGNOSIS — M898X9 Other specified disorders of bone, unspecified site: Secondary | ICD-10-CM

## 2012-09-28 DIAGNOSIS — D509 Iron deficiency anemia, unspecified: Secondary | ICD-10-CM | POA: Insufficient documentation

## 2012-09-28 DIAGNOSIS — D649 Anemia, unspecified: Secondary | ICD-10-CM

## 2012-09-28 MED ORDER — HYDROCODONE-ACETAMINOPHEN 5-325 MG PO TABS: 1.0000 | ORAL_TABLET | Freq: Four times a day (QID) | ORAL | Status: DC | PRN

## 2012-09-28 NOTE — Progress Notes (Signed)
See same day bone marrow biopsy note.    Patient asked for an emergency supply of Lortab since he has lost all of his 90 tablet of Lortab that I gave him on 09/17/2012.  He has severe diffuse osteoarthritic pain that is not improved with just tylenol.  I gave him 20 tablets of Vicodin and instructed him to keep his med more carefully and talk with his PCP for further eval and refill of pain meds as appropriate.

## 2012-09-28 NOTE — Patient Instructions (Addendum)
[facility] Discharge Instructions for Post Bone Marrow Procedure  Today you had a bone marrow biopsy and aspirate of L hip  Please keep the pressure dressing in place for at least 24 hours.  Have someone check your dressing periodically for bleeding.  If needed you can reapply a pressure dressing to the site.  Take pain medication tylenol as directed.  IF BLEEDING REOCCURS THAT SHOULD BE REPORTED IMMEDIATELY. Call the Cancer Center at [phone] if during business hours. Or report to the Emergency Room.   I have been informed and understand all the instructions given to me. I know to contact the clinic, my physician, or go to the Emergency Department if any problems should occur. I do not have any questions at this time, but understand that I may call the clinic during office hours at [phone]  should I have any questions or need assistance in obtaining follow up care.    __________________________________________  _____________  __________ Signature of Patient or Authorized Representative            Date                   Time    __________________________________________ Nurse's Signature

## 2012-09-28 NOTE — Procedures (Signed)
   [facility]  Telephone:([phone] Fax:(336) [phone]   BONE MARROW BIOPSY AND ASPIRATION   INDICATION:  Procedure: After obtained from consent, Larry West was placed in the prone position. Time out was performed verifying correct patient and procedure. The skin overlying the left posterior crest was prepped with Betadine and draped in the usual sterile fashion. The skin and periosteum were infiltrated with 20 mL of 2% lidocaine. A small puncture wound was made with #11 scalpel blade.  Bone marrow aspirate was obtained on the first pass of the aspiration needle.  One separate core biopsy was obtained through the same incision.   The aspirate was sent for routine histology, flow cytometry, and cytogenetics.  Core biopsy was sent for routine histology.   Larry West tolerated procedure well with minimal  blood loss and without immediate complication.   A sterile dressing was applied.   Jethro Bolus M.D. 09/28/2012

## 2012-09-30 ENCOUNTER — Encounter: Payer: Self-pay | Admitting: Oncology

## 2012-10-06 ENCOUNTER — Telehealth: Payer: Self-pay | Admitting: *Deleted

## 2012-10-06 NOTE — Telephone Encounter (Signed)
Pt got letter from Dr. Gaylyn Rong and asked for clarification from nurse.  Explained as letter states,  Bone Marrow Biopsy results did NOT show any cancer.  We are still waiting for final result to see if any other explanation for his anemia.  Meanwhile we will continue to treat anemia w/ aranesp as ordered.  Keep next appt on 2/13 as scheduled. Pt verbalized understanding.

## 2012-10-13 ENCOUNTER — Other Ambulatory Visit: Payer: Self-pay | Admitting: Oncology

## 2012-10-13 DIAGNOSIS — D649 Anemia, unspecified: Secondary | ICD-10-CM

## 2012-10-14 ENCOUNTER — Ambulatory Visit: Payer: Medicare HMO

## 2012-10-14 ENCOUNTER — Other Ambulatory Visit: Payer: Medicare HMO

## 2012-10-15 ENCOUNTER — Telehealth: Payer: Self-pay | Admitting: Oncology

## 2012-10-15 NOTE — Telephone Encounter (Signed)
returned pt call and advised on r/s appt for 2.17.14

## 2012-10-18 ENCOUNTER — Other Ambulatory Visit: Payer: Medicare HMO | Admitting: Lab

## 2012-10-18 ENCOUNTER — Ambulatory Visit: Payer: Medicare HMO

## 2012-10-19 ENCOUNTER — Ambulatory Visit (HOSPITAL_BASED_OUTPATIENT_CLINIC_OR_DEPARTMENT_OTHER): Payer: Medicare HMO

## 2012-10-19 ENCOUNTER — Other Ambulatory Visit: Payer: Medicare HMO | Admitting: Lab

## 2012-10-19 VITALS — BP 162/74 | HR 58 | Temp 97.2°F

## 2012-10-19 DIAGNOSIS — M545 Low back pain: Secondary | ICD-10-CM

## 2012-10-19 DIAGNOSIS — I10 Essential (primary) hypertension: Secondary | ICD-10-CM

## 2012-10-19 DIAGNOSIS — Z86010 Personal history of colonic polyps: Secondary | ICD-10-CM

## 2012-10-19 DIAGNOSIS — D649 Anemia, unspecified: Secondary | ICD-10-CM

## 2012-10-19 DIAGNOSIS — R569 Unspecified convulsions: Secondary | ICD-10-CM

## 2012-10-19 DIAGNOSIS — D539 Nutritional anemia, unspecified: Secondary | ICD-10-CM

## 2012-10-19 DIAGNOSIS — I739 Peripheral vascular disease, unspecified: Secondary | ICD-10-CM

## 2012-10-19 DIAGNOSIS — F1020 Alcohol dependence, uncomplicated: Secondary | ICD-10-CM

## 2012-10-19 LAB — CBC WITH DIFFERENTIAL/PLATELET
Basophils Absolute: 0.1 10*3/uL (ref 0.0–0.1)
EOS%: 1.9 % (ref 0.0–7.0)
Eosinophils Absolute: 0.1 10*3/uL (ref 0.0–0.5)
HGB: 9.3 g/dL — ABNORMAL LOW (ref 13.0–17.1)
LYMPH%: 28.4 % (ref 14.0–49.0)
MCH: 36.2 pg — ABNORMAL HIGH (ref 27.2–33.4)
MCV: 105.8 fL — ABNORMAL HIGH (ref 79.3–98.0)
MONO%: 8.9 % (ref 0.0–14.0)
NEUT#: 3.1 10*3/uL (ref 1.5–6.5)
Platelets: 197 10*3/uL (ref 140–400)
RDW: 15.5 % — ABNORMAL HIGH (ref 11.0–14.6)

## 2012-10-19 MED ORDER — DARBEPOETIN ALFA-POLYSORBATE 300 MCG/0.6ML IJ SOLN
300.0000 ug | Freq: Once | INTRAMUSCULAR | Status: AC
  Administered 2012-10-19: 300 ug via SUBCUTANEOUS
  Filled 2012-10-19: qty 0.6

## 2012-10-21 ENCOUNTER — Other Ambulatory Visit: Payer: Self-pay | Admitting: Oncology

## 2012-10-21 ENCOUNTER — Encounter: Payer: Self-pay | Admitting: Oncology

## 2012-10-21 DIAGNOSIS — D519 Vitamin B12 deficiency anemia, unspecified: Secondary | ICD-10-CM

## 2012-10-21 HISTORY — DX: Vitamin B12 deficiency anemia, unspecified: D51.9

## 2012-11-10 ENCOUNTER — Other Ambulatory Visit: Payer: Self-pay | Admitting: Oncology

## 2012-11-10 DIAGNOSIS — D649 Anemia, unspecified: Secondary | ICD-10-CM

## 2012-11-11 ENCOUNTER — Emergency Department (HOSPITAL_COMMUNITY): Payer: Medicare HMO

## 2012-11-11 ENCOUNTER — Encounter (HOSPITAL_COMMUNITY): Payer: Self-pay | Admitting: Radiology

## 2012-11-11 ENCOUNTER — Other Ambulatory Visit: Payer: Medicare HMO | Admitting: Lab

## 2012-11-11 ENCOUNTER — Inpatient Hospital Stay (HOSPITAL_COMMUNITY): Payer: Medicare HMO

## 2012-11-11 ENCOUNTER — Inpatient Hospital Stay (HOSPITAL_COMMUNITY): Payer: Medicare HMO | Admitting: Certified Registered Nurse Anesthetist

## 2012-11-11 ENCOUNTER — Encounter (HOSPITAL_COMMUNITY)
Admission: EM | Disposition: A | Discharge status: Skilled Nursing Facility | Payer: Self-pay | Source: Home / Self Care | Attending: Internal Medicine

## 2012-11-11 ENCOUNTER — Inpatient Hospital Stay (HOSPITAL_COMMUNITY)
Admission: EM | Admit: 2012-11-11 | Discharge: 2012-11-16 | DRG: 481 | Disposition: A | Discharge status: Skilled Nursing Facility | Payer: Medicare HMO | Attending: Internal Medicine | Admitting: Internal Medicine

## 2012-11-11 ENCOUNTER — Encounter (HOSPITAL_COMMUNITY): Payer: Self-pay | Admitting: Certified Registered Nurse Anesthetist

## 2012-11-11 ENCOUNTER — Ambulatory Visit: Payer: Medicare HMO

## 2012-11-11 DIAGNOSIS — S72143A Displaced intertrochanteric fracture of unspecified femur, initial encounter for closed fracture: Principal | ICD-10-CM | POA: Diagnosis present

## 2012-11-11 DIAGNOSIS — R569 Unspecified convulsions: Secondary | ICD-10-CM | POA: Diagnosis present

## 2012-11-11 DIAGNOSIS — I10 Essential (primary) hypertension: Secondary | ICD-10-CM | POA: Diagnosis present

## 2012-11-11 DIAGNOSIS — D638 Anemia in other chronic diseases classified elsewhere: Secondary | ICD-10-CM | POA: Diagnosis present

## 2012-11-11 DIAGNOSIS — N19 Unspecified kidney failure: Secondary | ICD-10-CM

## 2012-11-11 DIAGNOSIS — N183 Chronic kidney disease, stage 3 unspecified: Secondary | ICD-10-CM | POA: Diagnosis present

## 2012-11-11 DIAGNOSIS — G8929 Other chronic pain: Secondary | ICD-10-CM

## 2012-11-11 DIAGNOSIS — S72141A Displaced intertrochanteric fracture of right femur, initial encounter for closed fracture: Secondary | ICD-10-CM

## 2012-11-11 DIAGNOSIS — W19XXXA Unspecified fall, initial encounter: Secondary | ICD-10-CM

## 2012-11-11 DIAGNOSIS — D62 Acute posthemorrhagic anemia: Secondary | ICD-10-CM

## 2012-11-11 DIAGNOSIS — Y92009 Unspecified place in unspecified non-institutional (private) residence as the place of occurrence of the external cause: Secondary | ICD-10-CM

## 2012-11-11 DIAGNOSIS — W010XXA Fall on same level from slipping, tripping and stumbling without subsequent striking against object, initial encounter: Secondary | ICD-10-CM | POA: Diagnosis present

## 2012-11-11 DIAGNOSIS — S72001A Fracture of unspecified part of neck of right femur, initial encounter for closed fracture: Secondary | ICD-10-CM

## 2012-11-11 DIAGNOSIS — D649 Anemia, unspecified: Secondary | ICD-10-CM

## 2012-11-11 DIAGNOSIS — I129 Hypertensive chronic kidney disease with stage 1 through stage 4 chronic kidney disease, or unspecified chronic kidney disease: Secondary | ICD-10-CM | POA: Diagnosis present

## 2012-11-11 DIAGNOSIS — E875 Hyperkalemia: Secondary | ICD-10-CM | POA: Diagnosis present

## 2012-11-11 DIAGNOSIS — D631 Anemia in chronic kidney disease: Secondary | ICD-10-CM | POA: Diagnosis present

## 2012-11-11 HISTORY — PX: FEMUR IM NAIL: SHX1597

## 2012-11-11 LAB — BASIC METABOLIC PANEL
BUN: 66 mg/dL — ABNORMAL HIGH (ref 6–23)
CO2: 19 mEq/L (ref 19–32)
CO2: 17 mEq/L — ABNORMAL LOW (ref 19–32)
Calcium: 8.6 mg/dL (ref 8.4–10.5)
Calcium: 8.2 mg/dL — ABNORMAL LOW (ref 8.4–10.5)
Creatinine, Ser: 2.32 mg/dL — ABNORMAL HIGH (ref 0.50–1.35)
Creatinine, Ser: 2.29 mg/dL — ABNORMAL HIGH (ref 0.50–1.35)
GFR calc non Af Amer: 25 mL/min — ABNORMAL LOW (ref >90)
Glucose, Bld: 102 mg/dL — ABNORMAL HIGH (ref 70–99)
Glucose, Bld: 103 mg/dL — ABNORMAL HIGH (ref 70–99)
Sodium: 134 mEq/L — ABNORMAL LOW (ref 135–145)

## 2012-11-11 LAB — HEPATIC FUNCTION PANEL
Alkaline Phosphatase: 119 U/L — ABNORMAL HIGH (ref 39–117)
Bilirubin, Direct: <0.1 mg/dL (ref 0.0–0.3)
Total Bilirubin: 0.1 mg/dL — ABNORMAL LOW (ref 0.3–1.2)
Total Protein: 6.5 g/dL (ref 6.0–8.3)

## 2012-11-11 LAB — CBC WITH DIFFERENTIAL/PLATELET
Basophils Absolute: 0 10*3/uL (ref 0.0–0.1)
Eosinophils Absolute: 0.1 10*3/uL (ref 0.0–0.7)
Eosinophils Relative: 1 % (ref 0–5)
MCH: 36 pg — ABNORMAL HIGH (ref 26.0–34.0)
MCV: 108 fL — ABNORMAL HIGH (ref 78.0–100.0)
Platelets: 230 10*3/uL (ref 150–400)
RDW: 16.4 % — ABNORMAL HIGH (ref 11.5–15.5)

## 2012-11-11 LAB — ABO/RH: ABO/RH(D): O NEG

## 2012-11-11 LAB — SURGICAL PCR SCREEN: MRSA, PCR: NEGATIVE

## 2012-11-11 LAB — PHENYTOIN LEVEL, TOTAL: Phenytoin Lvl: 14.5 ug/mL (ref 10.0–20.0)

## 2012-11-11 LAB — PROTIME-INR: Prothrombin Time: 12.6 seconds (ref 11.6–15.2)

## 2012-11-11 SURGERY — INSERTION, INTRAMEDULLARY ROD, FEMUR
Anesthesia: General | Site: Hip | Laterality: Right | Wound class: Clean

## 2012-11-11 MED ORDER — LACTATED RINGERS IV SOLN: INTRAVENOUS | Status: DC

## 2012-11-11 MED ORDER — DARBEPOETIN ALFA-POLYSORBATE 300 MCG/0.6ML IJ SOLN
300.0000 ug | INTRAMUSCULAR | Status: DC
  Administered 2012-11-15: 300 ug via SUBCUTANEOUS
  Filled 2012-11-11 (×2): qty 0.6

## 2012-11-11 MED ORDER — ONDANSETRON HCL 4 MG/2ML IJ SOLN
4.0000 mg | Freq: Once | INTRAMUSCULAR | Status: AC
  Administered 2012-11-11: 4 mg via INTRAVENOUS
  Filled 2012-11-11: qty 2

## 2012-11-11 MED ORDER — OXYCODONE HCL 5 MG PO TABS
5.0000 mg | ORAL_TABLET | ORAL | Status: DC | PRN
  Administered 2012-11-13: 10 mg via ORAL
  Filled 2012-11-11: qty 2

## 2012-11-11 MED ORDER — ASPIRIN EC 325 MG PO TBEC
325.0000 mg | DELAYED_RELEASE_TABLET | Freq: Every day | ORAL | Status: DC
  Administered 2012-11-12 – 2012-11-16 (×5): 325 mg via ORAL
  Filled 2012-11-11 (×6): qty 1

## 2012-11-11 MED ORDER — METHOCARBAMOL 100 MG/ML IJ SOLN
500.0000 mg | Freq: Four times a day (QID) | INTRAVENOUS | Status: DC | PRN
  Filled 2012-11-11: qty 5

## 2012-11-11 MED ORDER — METOCLOPRAMIDE HCL 10 MG PO TABS: 5.0000 mg | ORAL_TABLET | Freq: Three times a day (TID) | ORAL | Status: DC | PRN

## 2012-11-11 MED ORDER — AMLODIPINE BESYLATE 10 MG PO TABS
10.0000 mg | ORAL_TABLET | Freq: Every morning | ORAL | Status: DC
  Administered 2012-11-12 – 2012-11-16 (×4): 10 mg via ORAL
  Filled 2012-11-11 (×6): qty 1

## 2012-11-11 MED ORDER — METOPROLOL TARTRATE 1 MG/ML IV SOLN
2.5000 mg | Freq: Four times a day (QID) | INTRAVENOUS | Status: DC | PRN
  Administered 2012-11-13 – 2012-11-14 (×2): 2.5 mg via INTRAVENOUS
  Filled 2012-11-11 (×2): qty 5

## 2012-11-11 MED ORDER — VANCOMYCIN HCL IN DEXTROSE 1-5 GM/200ML-% IV SOLN
INTRAVENOUS | Status: AC
  Filled 2012-11-11: qty 200

## 2012-11-11 MED ORDER — SODIUM CHLORIDE 0.9 % IV SOLN
20.0000 mL | INTRAVENOUS | Status: DC
  Administered 2012-11-11: 20 mL via INTRAVENOUS

## 2012-11-11 MED ORDER — PHENOL 1.4 % MT LIQD
1.0000 | OROMUCOSAL | Status: DC | PRN
  Filled 2012-11-11: qty 177

## 2012-11-11 MED ORDER — PHENYTOIN SODIUM EXTENDED 100 MG PO CAPS
200.0000 mg | ORAL_CAPSULE | Freq: Two times a day (BID) | ORAL | Status: DC
  Filled 2012-11-11: qty 2

## 2012-11-11 MED ORDER — FERROUS SULFATE 325 (65 FE) MG PO TABS
325.0000 mg | ORAL_TABLET | Freq: Three times a day (TID) | ORAL | Status: DC
  Administered 2012-11-12 – 2012-11-16 (×14): 325 mg via ORAL
  Filled 2012-11-11 (×16): qty 1

## 2012-11-11 MED ORDER — PHENYTOIN SODIUM EXTENDED 100 MG PO CAPS
200.0000 mg | ORAL_CAPSULE | Freq: Every day | ORAL | Status: DC
  Administered 2012-11-12 – 2012-11-15 (×5): 200 mg via ORAL
  Filled 2012-11-11 (×7): qty 2

## 2012-11-11 MED ORDER — SODIUM POLYSTYRENE SULFONATE 15 GM/60ML PO SUSP
15.0000 g | Freq: Once | ORAL | Status: DC
  Filled 2012-11-11: qty 60

## 2012-11-11 MED ORDER — MORPHINE SULFATE 2 MG/ML IJ SOLN
0.5000 mg | INTRAMUSCULAR | Status: DC | PRN
  Administered 2012-11-11: 0.5 mg via INTRAVENOUS
  Filled 2012-11-11: qty 1

## 2012-11-11 MED ORDER — MENTHOL 3 MG MT LOZG
1.0000 | LOZENGE | OROMUCOSAL | Status: DC | PRN
  Filled 2012-11-11: qty 9

## 2012-11-11 MED ORDER — ONDANSETRON HCL 4 MG/2ML IJ SOLN
INTRAMUSCULAR | Status: DC | PRN
  Administered 2012-11-11: 4 mg via INTRAVENOUS

## 2012-11-11 MED ORDER — VANCOMYCIN HCL IN DEXTROSE 1-5 GM/200ML-% IV SOLN
1000.0000 mg | INTRAVENOUS | Status: AC
  Administered 2012-11-11: 1000 mg via INTRAVENOUS
  Filled 2012-11-11: qty 200

## 2012-11-11 MED ORDER — LACTATED RINGERS IV SOLN
INTRAVENOUS | Status: DC
Start: 2012-11-11 — End: 2012-11-11

## 2012-11-11 MED ORDER — CALCITRIOL 0.25 MCG PO CAPS
0.2500 ug | ORAL_CAPSULE | Freq: Every morning | ORAL | Status: DC
  Administered 2012-11-12 – 2012-11-16 (×5): 0.25 ug via ORAL
  Filled 2012-11-11 (×6): qty 1

## 2012-11-11 MED ORDER — CALCITRIOL 0.25 MCG PO CAPS: 0.2500 ug | ORAL_CAPSULE | Freq: Every morning | ORAL | Status: DC

## 2012-11-11 MED ORDER — DOCUSATE SODIUM 100 MG PO CAPS
100.0000 mg | ORAL_CAPSULE | Freq: Two times a day (BID) | ORAL | Status: DC
  Administered 2012-11-12 – 2012-11-16 (×8): 100 mg via ORAL
  Filled 2012-11-11 (×11): qty 1

## 2012-11-11 MED ORDER — FENTANYL CITRATE 0.05 MG/ML IJ SOLN
50.0000 ug | Freq: Once | INTRAMUSCULAR | Status: AC
  Administered 2012-11-11: 50 ug via INTRAVENOUS
  Filled 2012-11-11: qty 2

## 2012-11-11 MED ORDER — FUROSEMIDE 10 MG/ML IJ SOLN
40.0000 mg | Freq: Once | INTRAMUSCULAR | Status: AC
  Administered 2012-11-11: 40 mg via INTRAVENOUS
  Filled 2012-11-11: qty 4

## 2012-11-11 MED ORDER — ACETAMINOPHEN 325 MG PO TABS: 650.0000 mg | ORAL_TABLET | Freq: Four times a day (QID) | ORAL | Status: DC | PRN

## 2012-11-11 MED ORDER — PROPOFOL 10 MG/ML IV BOLUS
INTRAVENOUS | Status: DC | PRN
  Administered 2012-11-11: 120 mg via INTRAVENOUS

## 2012-11-11 MED ORDER — FENTANYL CITRATE 0.05 MG/ML IJ SOLN
INTRAMUSCULAR | Status: AC
  Filled 2012-11-11: qty 2

## 2012-11-11 MED ORDER — LIDOCAINE HCL (PF) 2 % IJ SOLN
INTRAMUSCULAR | Status: DC | PRN
  Administered 2012-11-11: 50 mg

## 2012-11-11 MED ORDER — SODIUM CHLORIDE 0.9 % IV SOLN: INTRAVENOUS | Status: DC

## 2012-11-11 MED ORDER — ATORVASTATIN CALCIUM 40 MG PO TABS
40.0000 mg | ORAL_TABLET | Freq: Every day | ORAL | Status: DC
  Administered 2012-11-13 – 2012-11-15 (×3): 40 mg via ORAL
  Filled 2012-11-11 (×6): qty 1

## 2012-11-11 MED ORDER — SODIUM CHLORIDE 0.9 % IV BOLUS (SEPSIS)
500.0000 mL | Freq: Once | INTRAVENOUS | Status: AC
  Administered 2012-11-11: 500 mL via INTRAVENOUS

## 2012-11-11 MED ORDER — MIDAZOLAM HCL 5 MG/5ML IJ SOLN
INTRAMUSCULAR | Status: DC | PRN
  Administered 2012-11-11 (×2): 1 mg via INTRAVENOUS

## 2012-11-11 MED ORDER — FENTANYL CITRATE 0.05 MG/ML IJ SOLN
100.0000 ug | INTRAMUSCULAR | Status: AC | PRN
  Administered 2012-11-11 (×3): 100 ug via INTRAVENOUS
  Filled 2012-11-11 (×2): qty 2

## 2012-11-11 MED ORDER — ONDANSETRON HCL 4 MG/2ML IJ SOLN: 4.0000 mg | Freq: Four times a day (QID) | INTRAMUSCULAR | Status: DC | PRN

## 2012-11-11 MED ORDER — FENTANYL CITRATE 0.05 MG/ML IJ SOLN
25.0000 ug | INTRAMUSCULAR | Status: DC | PRN
  Administered 2012-11-11 (×2): 50 ug via INTRAVENOUS

## 2012-11-11 MED ORDER — ONDANSETRON HCL 4 MG PO TABS: 4.0000 mg | ORAL_TABLET | Freq: Four times a day (QID) | ORAL | Status: DC | PRN

## 2012-11-11 MED ORDER — METOCLOPRAMIDE HCL 5 MG/ML IJ SOLN: 5.0000 mg | Freq: Three times a day (TID) | INTRAMUSCULAR | Status: DC | PRN

## 2012-11-11 MED ORDER — HYDROCODONE-ACETAMINOPHEN 5-325 MG PO TABS
1.0000 | ORAL_TABLET | Freq: Four times a day (QID) | ORAL | Status: DC | PRN
  Administered 2012-11-13: 1 via ORAL
  Filled 2012-11-11: qty 2

## 2012-11-11 MED ORDER — LACTATED RINGERS IV SOLN
INTRAVENOUS | Status: DC | PRN
  Administered 2012-11-11: 18:00:00 via INTRAVENOUS

## 2012-11-11 MED ORDER — ZOLPIDEM TARTRATE 5 MG PO TABS: 5.0000 mg | ORAL_TABLET | Freq: Every evening | ORAL | Status: DC | PRN

## 2012-11-11 MED ORDER — CLINDAMYCIN PHOSPHATE 600 MG/50ML IV SOLN
600.0000 mg | Freq: Four times a day (QID) | INTRAVENOUS | Status: AC
  Administered 2012-11-12 (×2): 600 mg via INTRAVENOUS
  Filled 2012-11-11 (×2): qty 50

## 2012-11-11 MED ORDER — ALUM & MAG HYDROXIDE-SIMETH 200-200-20 MG/5ML PO SUSP: 30.0000 mL | ORAL | Status: DC | PRN

## 2012-11-11 MED ORDER — METOPROLOL TARTRATE 1 MG/ML IV SOLN: 2.5000 mg | Freq: Four times a day (QID) | INTRAVENOUS | Status: DC | PRN

## 2012-11-11 MED ORDER — SUCCINYLCHOLINE CHLORIDE 20 MG/ML IJ SOLN
INTRAMUSCULAR | Status: DC | PRN
  Administered 2012-11-11: 100 mg via INTRAVENOUS

## 2012-11-11 MED ORDER — CHLORHEXIDINE GLUCONATE 4 % EX LIQD: 60.0000 mL | Freq: Once | CUTANEOUS | Status: DC

## 2012-11-11 MED ORDER — HYDROCODONE-ACETAMINOPHEN 5-325 MG PO TABS
1.0000 | ORAL_TABLET | ORAL | Status: DC | PRN
  Administered 2012-11-12: 1 via ORAL
  Administered 2012-11-12 (×2): 2 via ORAL
  Administered 2012-11-13: 1 via ORAL
  Administered 2012-11-13: 2 via ORAL
  Administered 2012-11-13: 1 via ORAL
  Administered 2012-11-14 – 2012-11-16 (×7): 2 via ORAL
  Administered 2012-11-16: 1 via ORAL
  Filled 2012-11-11 (×8): qty 2
  Filled 2012-11-11: qty 1
  Filled 2012-11-11 (×4): qty 2
  Filled 2012-11-11: qty 1

## 2012-11-11 MED ORDER — METHOCARBAMOL 500 MG PO TABS
500.0000 mg | ORAL_TABLET | Freq: Four times a day (QID) | ORAL | Status: DC | PRN
  Administered 2012-11-12 – 2012-11-13 (×2): 500 mg via ORAL
  Filled 2012-11-11 (×2): qty 1

## 2012-11-11 MED ORDER — FENTANYL CITRATE 0.05 MG/ML IJ SOLN
INTRAMUSCULAR | Status: DC | PRN
  Administered 2012-11-11: 50 ug via INTRAVENOUS
  Administered 2012-11-11: 100 ug via INTRAVENOUS
  Administered 2012-11-11 (×2): 50 ug via INTRAVENOUS

## 2012-11-11 MED ORDER — ACETAMINOPHEN 650 MG RE SUPP: 650.0000 mg | Freq: Four times a day (QID) | RECTAL | Status: DC | PRN

## 2012-11-11 MED ORDER — SODIUM CHLORIDE 0.9 % IJ SOLN
3.0000 mL | Freq: Two times a day (BID) | INTRAMUSCULAR | Status: DC
  Administered 2012-11-12: 20:00:00 via INTRAVENOUS
  Administered 2012-11-12 – 2012-11-16 (×7): 3 mL via INTRAVENOUS

## 2012-11-11 MED ORDER — SODIUM POLYSTYRENE SULFONATE 15 GM/60ML PO SUSP
15.0000 g | Freq: Once | ORAL | Status: AC
  Administered 2012-11-11: 15 g via RECTAL
  Filled 2012-11-11: qty 60

## 2012-11-11 SURGICAL SUPPLY — 40 items
BAG SPEC THK2 15X12 ZIP CLS (MISCELLANEOUS) ×1
BAG ZIPLOCK 12X15 (MISCELLANEOUS) ×2 IMPLANT
BANDAGE GAUZE ELAST BULKY 4 IN (GAUZE/BANDAGES/DRESSINGS) ×2 IMPLANT
CLOTH BEACON ORANGE TIMEOUT ST (SAFETY) ×2 IMPLANT
DRAPE STERI IOBAN 125X83 (DRAPES) ×2 IMPLANT
DRSG MEPILEX BORDER 4X4 (GAUZE/BANDAGES/DRESSINGS) ×1 IMPLANT
DRSG PAD ABDOMINAL 8X10 ST (GAUZE/BANDAGES/DRESSINGS) ×2 IMPLANT
DRSG TEGADERM 4X4.75 (GAUZE/BANDAGES/DRESSINGS) IMPLANT
DURAPREP 26ML APPLICATOR (WOUND CARE) ×2 IMPLANT
ELECT REM PT RETURN 9FT ADLT (ELECTROSURGICAL) ×2
ELECTRODE REM PT RTRN 9FT ADLT (ELECTROSURGICAL) ×1 IMPLANT
FACESHIELD LNG OPTICON STERILE (SAFETY) IMPLANT
GAUZE XEROFORM 5X9 LF (GAUZE/BANDAGES/DRESSINGS) ×2 IMPLANT
GLOVE BIO SURGEON STRL SZ7.5 (GLOVE) ×2 IMPLANT
GOWN STRL REIN XL XLG (GOWN DISPOSABLE) ×2 IMPLANT
GUIDE PIN 3.2 LONG (PIN) ×1 IMPLANT
GUIDE PIN 3.2MM (MISCELLANEOUS) ×2
GUIDE PIN ORTH 343X3.2XBRAD (MISCELLANEOUS) IMPLANT
HIP SCREW SET (Screw) ×1 IMPLANT
NAIL INHS RIGHT 10X38 (Nail) ×1 IMPLANT
NS IRRIG 1000ML POUR BTL (IV SOLUTION) ×2 IMPLANT
PACK GENERAL/GYN (CUSTOM PROCEDURE TRAY) ×2 IMPLANT
PAD CAST 4YDX4 CTTN HI CHSV (CAST SUPPLIES) ×1 IMPLANT
PADDING CAST COTTON 4X4 STRL (CAST SUPPLIES) ×2
POSITIONER SURGICAL ARM (MISCELLANEOUS) ×2 IMPLANT
SCREW COMPRESSION (Screw) ×1 IMPLANT
SCREW LAG 95MM (Screw) ×2 IMPLANT
SCREW LAGSTD 95X21X12.7X9 (Screw) IMPLANT
SPONGE GAUZE 4X4 12PLY (GAUZE/BANDAGES/DRESSINGS) ×2 IMPLANT
SPONGE LAP 18X18 X RAY DECT (DISPOSABLE) IMPLANT
STAPLER VISISTAT 35W (STAPLE) ×2 IMPLANT
SUT VIC AB 0 CT1 27 (SUTURE) ×2
SUT VIC AB 0 CT1 27XBRD ANTBC (SUTURE) ×1 IMPLANT
SUT VIC AB 1 CT1 27 (SUTURE) ×4
SUT VIC AB 1 CT1 27XBRD ANTBC (SUTURE) ×2 IMPLANT
SUT VIC AB 2-0 CT1 27 (SUTURE) ×2
SUT VIC AB 2-0 CT1 TAPERPNT 27 (SUTURE) ×1 IMPLANT
TOWEL OR 17X26 10 PK STRL BLUE (TOWEL DISPOSABLE) ×4 IMPLANT
TRAY FOLEY CATH 14FRSI W/METER (CATHETERS) IMPLANT
WATER STERILE IRR 1500ML POUR (IV SOLUTION) ×2 IMPLANT

## 2012-11-11 NOTE — ED Notes (Signed)
Pt came in via EMS; states that he fell at home c/o right hip pain. VS per EMS on arrival pt BP 210/90; HR 60bpm.  EMS gave of Fentanyl IV and 4mg  Zofran IV. IV started in the [facility] by EMS. Orignial pain level of 10/10 after pain meds.  given by EMS 4/10. Pt alert and oriented x4. He is cancer pt at [facility] and states that has an appt today at 8:00.

## 2012-11-11 NOTE — Brief Op Note (Signed)
11/11/2012  8:40 PM  PATIENT:  Larry West  79 y.o. male  PRE-OPERATIVE DIAGNOSIS:  right hip intertrochantaric fracture  POST-OPERATIVE DIAGNOSIS:  right hip intertrochanteric fracture  PROCEDURE:  Procedure(s): INTRAMEDULLARY (IM) NAIL FEMORAL (Right)  SURGEON:  Surgeon(s) and Role:    * Kathryne Hitch, MD - Primary  PHYSICIAN ASSISTANT: Rexene Edison, PA-C  ASSISTANTS: Rexene Edison, PA-C   ANESTHESIA:   spinal and general  EBL:  Total I/O In: -  Out: 125 [Urine:75; Blood:50]  BLOOD ADMINISTERED:none  DRAINS: none   LOCAL MEDICATIONS USED:  NONE  SPECIMEN:  No Specimen  DISPOSITION OF SPECIMEN:  N/A  COUNTS:  YES  TOURNIQUET:  * No tourniquets in log *  DICTATION: .Other Dictation: Dictation Number [id number]  PLAN OF CARE: Admit to inpatient   PATIENT DISPOSITION:  PACU - hemodynamically stable.   Delay start of Pharmacological VTE agent (>24hrs) due to surgical blood loss or risk of bleeding: no

## 2012-11-11 NOTE — ED Notes (Signed)
[id number]<AV> Expected date:11/11/12<BR> Expected time: 7:00 AM<BR> Means of arrival:Ambulance<BR> Comments:<BR> Fall  79 yo M

## 2012-11-11 NOTE — Anesthesia Postprocedure Evaluation (Signed)
  Anesthesia Post-op Note  Patient: Larry West  Procedure(s) Performed: Procedure(s) (LRB): INTRAMEDULLARY (IM) NAIL FEMORAL (Right)  Patient Location: PACU  Anesthesia Type: General  Level of Consciousness: awake and alert   Airway and Oxygen Therapy: Patient Spontanous Breathing  Post-op Pain: mild  Post-op Assessment: Post-op Vital signs reviewed, Patient's Cardiovascular Status Stable, Respiratory Function Stable, Patent Airway and No signs of Nausea or vomiting  Last Vitals:  Filed Vitals:   11/11/12 2130  BP: 192/83  Pulse: 63  Temp: 36.4 C  Resp: 16    Post-op Vital Signs: stable   Complications: No apparent anesthesia complications

## 2012-11-11 NOTE — Transfer of Care (Signed)
Immediate Anesthesia Transfer of Care Note  Patient: Larry West  Procedure(s) Performed: Procedure(s): INTRAMEDULLARY (IM) NAIL FEMORAL (Right)  Patient Location: PACU  Anesthesia Type:General  Level of Consciousness: awake and pateint uncooperative  Airway & Oxygen Therapy: Patient Spontanous Breathing and Patient connected to face mask oxygen  Post-op Assessment: Report given to PACU RN, Post -op Vital signs reviewed and stable and patient more relaxed after Versed given.  Post vital signs: Reviewed and stable  Complications: No apparent anesthesia complications

## 2012-11-11 NOTE — H&P (Addendum)
[facility] History and Physical  GARI HARTSELL OZH:[id number] DOB: 06-04-1926 DOA: 11/11/2012  Referring physician: Dr. Bebe Shaggy PCP: Leo Grosser, MD   Chief Complaint: Right hip pain.   History of Present Illness: Larry West is an 79 y.o. male with a PMH of ETOHism, HTN, CAD, CKD who suffered a mechanical fall onto his left side.  He was unable to stand after the fall.  His family then called EMS who brought him to the ER and radiographs were done which confirmed a left hip fracture.  The patient has a history of vertigo, but denies dizziness or syncope prior to his fall.  He says he tripped over a towel.  He denies any recent chest pain.  He has a h/o of stent placement, had been on Plavix until 12/13, but this was stopped secondary to a fall resulting in possible liver laceration.  No change in his activity tolerance or shortness of breath with activity.    Review of Systems: Constitutional: No fever, no chills;  Appetite normal; No weight loss, no weight gain.  HEENT: No blurry vision, no diplopia, no pharyngitis, no dysphagia CV: No chest pain, no palpitations.  Resp: Occasional SOB and cough. GI: No nausea, no vomiting, no diarrhea, no melena, no hematochezia.  GU: No dysuria, no hematuria, +nocturia and frequency.  MSK: no myalgias, no arthralgias.  Neuro:  No headache, no focal neurological deficits, + history of seizures.  Psych: No depression, no anxiety.  Endo: No thyroid disease, no DM, no heat intolerance, no cold intolerance, no polyuria, no polydipsia  Skin: No rashes, no skin lesions.  Heme: No easy bruising, no history of blood diseases.  Past Medical History Past Medical History  Diagnosis Date  . Hypertension   . Chronic low back pain     right leg pain   . Hip pain     left  . Seizures 2006  . ETOHism   . Cataract   . CAD (coronary artery disease) 2001    s/p PTCI  . PVD (posterior vitreous detachment), left eye   . Anemia     CKD  . Colon polyps    . Chronic kidney disease (CKD), stage III (moderate)   . Hyperlipidemia   . Hyperkalemia   . Bone pain 09/16/2012  . Vitamin B12 deficiency anemia 10/21/2012     Past Surgical History Past Surgical History  Procedure Laterality Date  . Cataracts surgery    . Appendectomy  1955  . Gastric outlet obstruction surgery  1988  . Lumbar disc surgery       Social History: History   Social History  . Marital Status: Divorced    Spouse Name: N/A    Number of Children: 6  . Years of Education: N/A   Occupational History  . RETIRED     rancher   Social History Main Topics  . Smoking status: Never Smoker   . Smokeless tobacco: Never Used  . Alcohol Use: 4.8 oz/week    8 Shots of liquor per week     Comment: patient reports he drinks 1/2 pint of gin from the bottle each night  . Drug Use: No  . Sexually Active: No   Other Topics Concern  . Not on file   Social History Narrative   Divorced.  Lives with an "adopted granddaughter", after being kicked out of the house by his son.  No ETOH since 08/2012.  Normally ambulates with a walker.      Family  History:  Family History  Problem Relation Age of Onset  . Cancer Son 26    colon cancer    Allergies: Darvocet; Penicillins; and Sulfonamide derivatives  Meds: Prior to Admission medications   Medication Sig Start Date End Date Taking? Authorizing Provider  amLODipine (NORVASC) 10 MG tablet Take 10 mg by mouth every morning.   Yes Historical Provider, MD  atorvastatin (LIPITOR) 40 MG tablet Take 40 mg by mouth at bedtime.    Yes Historical Provider, MD  calcitRIOL (ROCALTROL) 0.25 MCG capsule Take 0.25 mcg by mouth every morning.    Yes Historical Provider, MD  darbepoetin (ARANESP) 200 MCG/0.4ML SOLN Inject 200 mcg into the skin every 28 (twenty-eight) days. Due 11/11/12   Yes Historical Provider, MD  HYDROcodone-acetaminophen (NORCO/VICODIN) 5-325 MG per tablet Take 1 tablet by mouth every 6 (six) hours as needed for pain.  09/28/12  Yes [name], MD  meclizine (ANTIVERT) 12.5 MG tablet Take 12.5 mg by mouth 3 (three) times daily.    Yes Historical Provider, MD  phenytoin (DILANTIN) 100 MG ER capsule Take 200 mg by mouth 2 (two) times daily.    Yes Historical Provider, MD    Physical Exam: Filed Vitals:   11/11/12 0729 11/11/12 1116  BP: 199/83 177/73  Pulse: 60 57  Temp: 98 F (36.7 C)   TempSrc: Oral   Resp: 16 11  SpO2: 99% 98%     Physical Exam: Blood pressure 177/73, pulse 57, temperature 98 F (36.7 C), temperature source Oral, resp. rate 11, SpO2 98.00%. Gen: No acute distress. Head: Normocephalic, atraumatic. Eyes: PERRL, EOMI, sclerae nonicteric. Mouth: Oropharynx clear with multiple missing teeth and poor dentition. Neck: Supple, no thyromegaly, no lymphadenopathy, no jugular venous distention. Chest: Lungs diminished in the bases but clear bilaterally anteriorly. CV: Heart sounds regular without murmur, rub, or gallops. Abdomen: Soft, nontender, nondistended with normal active bowel sounds. Extremities: Extremities without clubbing, edema, or cyanosis. Skin: Warm and dry. Neuro: Alert and oriented times 3; cranial nerves II through XII grossly intact. Psych: Mood and affect normal.  Labs on Admission:  Basic Metabolic Panel:  Recent Labs Lab 11/11/12 0850 11/11/12 1100  NA 134* 134*  K 5.8* 5.7*  CL 104 105  CO2 19 17*  GLUCOSE 102* 103*  BUN 66* 66*  CREATININE 2.32* 2.29*  CALCIUM 8.6 8.2*   CBC:  Recent Labs Lab 11/11/12 0850  WBC 8.9  NEUTROABS 7.1  HGB 9.9*  HCT 29.7*  MCV 108.0*  PLT 230    Radiological Exams on Admission: Dg Chest 1 View  11/11/2012  *RADIOLOGY REPORT*  Clinical Data: Right hip fracture.  Coronary artery disease and hypertension. Pre-op respiratory exam.  CHEST - 1 VIEW  Comparison: 08/23/2012  Findings: Low lung volumes are seen however both lungs are clear. No evidence of pleural effusion.  Heart size is stable.  No mass or  lymphadenopathy identified.  IMPRESSION: Low lung volumes.  No active disease.   Original Report Authenticated By: Myles Rosenthal, M.D.    Dg Hip Complete Right  11/11/2012  *RADIOLOGY REPORT*  Clinical Data: Fall.  Severe right hip pain.  RIGHT HIP - COMPLETE 2+ VIEW  Comparison: None.  Findings: Intertrochanteric right hip fracture is seen with mild varus angulation.  No evidence of dislocation.  No evidence of acetabular pelvic fracture.  Peripheral vascular calcification noted.  IMPRESSION: Intertrochanteric right hip fracture.   Original Report Authenticated By: Myles Rosenthal, M.D.    Ct Head Wo Contrast  11/11/2012  *  RADIOLOGY REPORT*  Clinical Data:  Fall with hip pain.  CT HEAD WITHOUT CONTRAST CT CERVICAL SPINE WITHOUT CONTRAST  Technique:  Multidetector CT imaging of the head and cervical spine was performed following the standard protocol without intravenous contrast.  Multiplanar CT image reconstructions of the cervical spine were also generated.  Comparison:  08/20/2012.  CT HEAD  Findings: No evidence of acute infarct, acute hemorrhage, mass lesion, mass effect or hydrocephalus.  Atrophy.  Periventricular low attenuation.  Scattered mucosal thickening in the visualized paranasal sinuses. Mastoid air cells are clear.  IMPRESSION:  1.  No acute intracranial abnormality. 2.  Atrophy and chronic microvascular white matter ischemic changes.  CT CERVICAL SPINE  Findings: 4 mm anterolisthesis of C7 on T1, most likely degenerative in etiology.  Alignment is otherwise anatomic. Multilevel uncovertebral and facet hypertrophy with endplate degenerative changes and loss of disc space height, from C3-4 to C7- T1.  Findings are worst at C4-5 and C6-7.  And C3-4, moderate bilateral neural foraminal narrowing.  At C4-5, moderate right, and mild left neural foraminal narrowing.  At C5-6, mild bilateral neural foraminal narrowing.  At C6-7, moderate bilateral neural foraminal narrowing.  Visualized portions of the lung  apices are clear.  Low attenuation lesions in the thyroid measure up to approximately 7 mm on the right.  Soft tissues are otherwise unremarkable.  IMPRESSION:  1.  No acute fracture or subluxation. 2.  Advanced multilevel spondylosis.   Original Report Authenticated By: Leanna Battles, M.D.    Ct Cervical Spine Wo Contrast  11/11/2012  *RADIOLOGY REPORT*  Clinical Data:  Fall with hip pain.  CT HEAD WITHOUT CONTRAST CT CERVICAL SPINE WITHOUT CONTRAST  Technique:  Multidetector CT imaging of the head and cervical spine was performed following the standard protocol without intravenous contrast.  Multiplanar CT image reconstructions of the cervical spine were also generated.  Comparison:  08/20/2012.  CT HEAD  Findings: No evidence of acute infarct, acute hemorrhage, mass lesion, mass effect or hydrocephalus.  Atrophy.  Periventricular low attenuation.  Scattered mucosal thickening in the visualized paranasal sinuses. Mastoid air cells are clear.  IMPRESSION:  1.  No acute intracranial abnormality. 2.  Atrophy and chronic microvascular white matter ischemic changes.  CT CERVICAL SPINE  Findings: 4 mm anterolisthesis of C7 on T1, most likely degenerative in etiology.  Alignment is otherwise anatomic. Multilevel uncovertebral and facet hypertrophy with endplate degenerative changes and loss of disc space height, from C3-4 to C7- T1.  Findings are worst at C4-5 and C6-7.  And C3-4, moderate bilateral neural foraminal narrowing.  At C4-5, moderate right, and mild left neural foraminal narrowing.  At C5-6, mild bilateral neural foraminal narrowing.  At C6-7, moderate bilateral neural foraminal narrowing.  Visualized portions of the lung apices are clear.  Low attenuation lesions in the thyroid measure up to approximately 7 mm on the right.  Soft tissues are otherwise unremarkable.  IMPRESSION:  1.  No acute fracture or subluxation. 2.  Advanced multilevel spondylosis.   Original Report Authenticated By: Leanna Battles,  M.D.     EKG: Independently reviewed. ? Accelerated junctional escape rhythm at 57 beats per minute. I am able to see P waves on the telemetry monitor, so suspect this is just artifactual.  Assessment/Plan Principal Problem:   Closed right hip fracture: Preoperative Risk Assessment Cardio-Pulm Risk stratification for surgery and recommendations to minimize the same:-  A.Cardio-Pulmonary Risk -  This patient has a class I-class II risk index, which corresponds to a I-7%  for adverse cardio-pulmonary outcome  from surgery; The risks and benefits were discussed and acceptable to Mr. Seto.  If his EKG rhythm is truly accelerated junctional escape rhythm, his risk is up to 7% but if he truly is in normal sinus, his risk drops to about 1%. Have asked Dr. Jones Broom to look at his EKG and he agrees that this is normal sinus rhythm. Given this, I recommend proceeding directly to surgery.  Recommendations for optimizing Cardio-Pulmonary  Risk risk factors  1. Keep SBP<140, HR<85, will use Lopressor 5mg  IV q4hrs PRN, or B.Blocker drip PRN. 2. Tight I&O goal to keep even. 3. Minimal sedation. 4. Good pulmunary toilet. 5. Scheduled/PRN Nebs ordered keep Pox>90% 6. Hb<8, transfuse as needed- Lasix 10mg  IV after each unit PRBC Transfused.   B.Bleeding Risk - No previous surgical complications, no easy bruising, INR 0.95, Platelet count 230. Hold pre-operative Lovenox for DVT prophylaxis, but would initiate this postoperatively.  *Will request Surgeon to please Order Lovenox/DVT prophylaxis of his/her choice when OK from Surgeon's standpoint post op.  Active Problems:   HTN (hypertension) -Continue Norvasc. Lopressor when necessary ordered.   Seizures -Continue Dilantin. Dilantin level 14.5.   Chronic kidney disease (CKD), stage III (moderate) -Creatinine appears to be at usual baseline values.   Anemia of chronic disease -Due for a dose of Aranesp today. We'll give this.   Fall -PT and OT  evaluations postoperatively.   Hyperkalemia -Treated with Lasix, IV fluids, and rectal Kayexalate.   Code Status: Full. Family Communication: Angela Burke (daughter) [phone]; Ok Anis (companion/care taker) [phone] or cell [phone]. Disposition Plan: Likely SNF  Time spent: 1 hour.  RAMA,CHRISTINA [facility] Pager [phone]  If 7PM-7AM, please contact night-coverage [email or web address] Password [facility] 11/11/2012, 12:02 PM

## 2012-11-11 NOTE — Progress Notes (Signed)
MEDICATION RELATED CONSULT NOTE - INITIAL   Pharmacy Consult for Phenytoin Indication: Hx seizures  Allergies  Allergen Reactions  . Darvocet (Propoxyphene-Acetaminophen) Nausea Only  . Penicillins Swelling    Unknown    . Sulfonamide Derivatives Itching   Patient Measurements: Height: 5' 7.5" (171.5 cm) Weight: 147 lb (66.679 kg) IBW/kg (Calculated) : 67.25  Vital Signs: Temp: 97.6 F (36.4 C) (03/13 1636) Temp src: Oral (03/13 1636) BP: 162/78 mmHg (03/13 1636) Pulse Rate: 61 (03/13 1937) Intake/Output from previous day:   Intake/Output from this shift: Total I/O In: 950 [I.V.:950] Out: 125 [Urine:75; Blood:50]  Labs:  Recent Labs  11/11/12 0850 11/11/12 1100 11/11/12 1115  WBC 8.9  --   --   HGB 9.9*  --   --   HCT 29.7*  --   --   PLT 230  --   --   CREATININE 2.32* 2.29*  --   ALBUMIN  --   --  2.7*  PROT  --   --  6.5  AST  --   --  32  ALT  --   --  19  ALKPHOS  --   --  119*  BILITOT  --   --  0.1*  BILIDIR  --   --  <0.1  IBILI  --   --  NOT CALCULATED   Estimated Creatinine Clearance: 22.7 ml/min (by C-G formula based on Cr of 2.29).   Microbiology: Recent Results (from the past 720 hour(s))  SURGICAL PCR SCREEN     Status: None   Collection Time    11/11/12  4:30 PM      Result Value Range Status   MRSA, PCR NEGATIVE  NEGATIVE Final   Staphylococcus aureus NEGATIVE  NEGATIVE Final   Comment:            The Xpert SA Assay (FDA     approved for NASAL specimens     in patients over 77 years of age),     is one component of     a comprehensive surveillance     program.  Test performance has     been validated by [employer] for patients greater     than or equal to 81 year old.     It is not intended     to diagnose infection nor to     guide or monitor treatment.    Medical History: Past Medical History  Diagnosis Date  . Hypertension   . Chronic low back pain     right leg pain   . Hip pain     left  . Seizures 2006   . ETOHism   . Cataract   . CAD (coronary artery disease) 2001    s/p PTCI  . PVD (posterior vitreous detachment), left eye   . Anemia     CKD  . Colon polyps   . Chronic kidney disease (CKD), stage III (moderate)   . Hyperlipidemia   . Hyperkalemia   . Bone pain 09/16/2012  . Vitamin B12 deficiency anemia 10/21/2012   Medications:  Scheduled:  . [facility] HOLD] amLODipine  10 mg Oral q morning - 10a  . [MAR HOLD] atorvastatin  40 mg Oral QHS  . [facility] HOLD] calcitRIOL  0.25 mcg Oral q morning - 10a  . chlorhexidine  60 mL Topical Once  . [facility] HOLD] darbepoetin  300 mcg Subcutaneous Q28 days  . [COMPLETED] fentaNYL      .  fentaNYL      . [COMPLETED] fentaNYL  50 mcg Intravenous Once  . [COMPLETED] furosemide  40 mg Intravenous Once  . [COMPLETED] ondansetron (ZOFRAN) IV  4 mg Intravenous Once  . phenytoin  200 mg Oral QHS  . [COMPLETED] sodium chloride  500 mL Intravenous Once  . [MAR HOLD] sodium chloride  3 mL Intravenous Q12H  . [COMPLETED] sodium polystyrene  15 g Rectal Once  . [COMPLETED] vancomycin  1,000 mg Intravenous 60 min Pre-Op  . [DISCONTINUED] sodium chloride   Intravenous STAT  . [DISCONTINUED] calcitRIOL  0.25 mcg Oral q morning - 10a  . [DISCONTINUED] phenytoin  200 mg Oral BID  . [DISCONTINUED] sodium polystyrene  15 g Rectal Once    Assessment: 79 yoM admitted for hip fx after fall at home. Hx of seizures; on Phenytoin 200mg  po bid.  Admission SCr elevated (previous levels > 2 for past year).   Phenytoin level today 14.5 corrects to ~ 23 with albumin 2.7 and Cl ~ 23 ml/min  Goal of Therapy:  Phenytoin level 10-20 mcg/ml  Plan:  Continue Phenytoin at 200mg  qhs only Monitor renal function Adjust dose as necessary, consider rpt level in 2-3 days (would not yet be steady state with dose reduction)  Otho Bellows PharmD Pager [phone] 11/11/2012, 9:27 PM

## 2012-11-11 NOTE — Anesthesia Preprocedure Evaluation (Addendum)
Anesthesia Evaluation  Patient identified by MRN, date of birth, ID band Patient awake    Reviewed: Allergy & Precautions, H&P , NPO status , Patient's Chart, lab work & pertinent test results  Airway Mallampati: II TM Distance: >3 FB Neck ROM: full    Dental  (+) Poor Dentition, Loose, Missing, Chipped and Dental Advisory Given Very loose tooth front lower.  Many missing and broken:   Pulmonary neg pulmonary ROS,  breath sounds clear to auscultation  Pulmonary exam normal       Cardiovascular Exercise Tolerance: Poor hypertension, Pt. on medications + CAD and + Peripheral Vascular Disease Rhythm:regular Rate:Normal  S/p PTCI.  Accelerated junctional escape rhythm   Neuro/Psych Seizures -,  negative neurological ROS  negative psych ROS   GI/Hepatic negative GI ROS, (+)     substance abuse  alcohol use,   Endo/Other  negative endocrine ROS  Renal/GU CRFRenal diseaseStage 3 chronic renal disease ( moderate )  CRT 2.2 BUN 66  negative genitourinary   Musculoskeletal   Abdominal   Peds  Hematology negative hematology ROS (+) anemia , hgb 9.9   Anesthesia Other Findings K 5.7  Reproductive/Obstetrics negative OB ROS                          Anesthesia Physical Anesthesia Plan  ASA: III  Anesthesia Plan: General   Post-op Pain Management:    Induction: Intravenous  Airway Management Planned: Oral ETT  Additional Equipment:   Intra-op Plan:   Post-operative Plan: Extubation in OR  Informed Consent: I have reviewed the patients History and Physical, chart, labs and discussed the procedure including the risks, benefits and alternatives for the proposed anesthesia with the patient or authorized representative who has indicated his/her understanding and acceptance.   Dental Advisory Given  Plan Discussed with: CRNA and Surgeon  Anesthesia Plan Comments:        Anesthesia Quick  Evaluation

## 2012-11-11 NOTE — ED Provider Notes (Signed)
History     CSN: [id number]  Arrival date & time 11/11/12  0720   First MD Initiated Contact with Patient 11/11/12 [phone]      Chief Complaint  Patient presents with  . Fall  . Hip Pain     Patient is a 79 y.o. male presenting with hip pain. The history is provided by the patient.  Hip Pain This is a new problem. Episode onset: just prior to arrival. The problem occurs constantly. The problem has been gradually worsening. Pertinent negatives include no chest pain, no abdominal pain, no headaches and no shortness of breath. The symptoms are aggravated by twisting (movement). The symptoms are relieved by rest. He has tried rest (analgesics) for the symptoms. The treatment provided mild relief.  pt reports he was in his bathroom and tripped and fell onto right hip.  He reports he may have hit his head but no LOC.  Denies significant headache.  No cp/sob.  No focal weakness.  He reports neck pain and chronic back pain that is unchanged.  No abdominal pain.  No vomiting.   He reports h/o seizures but did not have seizure today He reports he has h/o anemia and was scheduled to go to cancer center today  Past Medical History  Diagnosis Date  . Hypertension   . Chronic low back pain     right leg pain   . Hip pain     left  . Seizures 2006  . ETOHism   . Cataract   . CAD (coronary artery disease) 2001    s/p PTCI  . PVD (posterior vitreous detachment), left eye   . Anemia     CKD  . Colon polyps   . Chronic kidney disease (CKD), stage III (moderate)   . Hyperlipidemia   . Hyperkalemia   . Bone pain 09/16/2012  . Vitamin B12 deficiency anemia 10/21/2012    Past Surgical History  Procedure Laterality Date  . Cataracts surgery    . Appendectomy  1955  . Gastric outlet obstruction surgery  1988  . Lumbar disc surgery      Family History  Problem Relation Age of Onset  . Cancer Son 38    colon cancer    History  Substance Use Topics  . Smoking status: Never Smoker   .  Smokeless tobacco: Not on file  . Alcohol Use: 4.8 oz/week    8 Shots of liquor per week     Comment: patient reports he drinks 1/2 pint of gin from the bottle each night      Review of Systems  Constitutional: Negative for fever.  Respiratory: Negative for shortness of breath.   Cardiovascular: Negative for chest pain.  Gastrointestinal: Negative for abdominal pain.  Musculoskeletal: Positive for back pain.  Skin: Negative for color change.  Neurological: Negative for dizziness and headaches.  Psychiatric/Behavioral: Negative for agitation.  All other systems reviewed and are negative.    Allergies  Darvocet; Penicillins; and Sulfonamide derivatives  Home Medications   Current Outpatient Rx  Name  Route  Sig  Dispense  Refill  . atorvastatin (LIPITOR) 40 MG tablet   Oral   Take 40 mg by mouth at bedtime.          . benazepril (LOTENSIN) 10 MG tablet   Oral   Take 1 tablet by mouth daily.         . calcitRIOL (ROCALTROL) 0.25 MCG capsule   Oral   Take 0.25 mcg by mouth daily.          Marland Kitchen  darbepoetin (ARANESP) 200 MCG/0.4ML SOLN   Subcutaneous   Inject 200 mcg into the skin every 28 (twenty-eight) days. Next dose due Monday         . ferrous sulfate 325 (65 FE) MG tablet   Oral   Take 325 mg by mouth 2 (two) times daily.          Marland Kitchen HYDROcodone-acetaminophen (NORCO/VICODIN) 5-325 MG per tablet   Oral   Take 1 tablet by mouth every 6 (six) hours as needed for pain.   20 tablet   0   . meclizine (ANTIVERT) 12.5 MG tablet   Oral   Take 12.5 mg by mouth 3 (three) times daily.          . phenytoin (DILANTIN) 100 MG ER capsule   Oral   Take 200 mg by mouth 2 (two) times daily.          . sodium bicarbonate 650 MG tablet   Oral   Take 1,300 mg by mouth 2 (two) times daily.            BP 199/83  Pulse 60  Temp(Src) 98 F (36.7 C) (Oral)  Resp 16  SpO2 99%  Physical Exam CONSTITUTIONAL: Well developed/well nourished HEAD:  Normocephalic/atraumatic EYES: EOMI ENMT: Mucous membranes moist. Poor dentition.  No signs of trauma NECK: supple no meningeal signs SPINE:cervical spine tender, No bruising/crepitance/stepoffs noted to spine Patient maintained in spinal precautions/logroll utilized Pt without any obvious to thoracic/lumbar spine.  Pt denies any new pain in lumbar spine while lying Cervical collar placed immediately after initial exam CV: S1/S2 noted, no murmurs/rubs/gallops noted LUNGS: Lungs are clear to auscultation bilaterally, no apparent distress ABDOMEN: soft, nontender, no rebound or guarding GU:no cva tenderness NEURO: Pt is awake/alert, moves all extremitiesx4. Distal motor/sensory intact on right LE EXTREMITIES: pulses normal,right LE is externally rotated.  Tenderness with ROM of right hip, no open skin noted, no bruising noted.  All other extremities/joints palpated/ranged and nontender. No right knee/ankle tenderness or deformity SKIN: warm, color normal PSYCH: no abnormalities of mood noted   ED Course  Procedures   Labs Reviewed  BASIC METABOLIC PANEL  CBC WITH DIFFERENTIAL  PROTIME-INR  PHENYTOIN LEVEL, TOTAL  TYPE AND SCREEN   8:28 AM Spoke to dr Thayer Ohm blackman Will likely go to OR in late afternoon for right intertrochanteric fracture Admit to [facility] 10:06 AM D/w dr Darnelle Catalan Will admit to tele She requests dose of kayexalate rectal, lasix, and IV fluids and recheck bmet after meds given Pt is on monitor.     MDM  Nursing notes including past medical history and social history reviewed and considered in documentation xrays reviewed and considered Labs/vital reviewed and considered        Date: 11/11/2012  Rate: 57  Rhythm: normal sinus rhythm  QRS Axis: normal  Intervals: normal  ST/T Wave abnormalities: nonspecific ST changes  Conduction Disutrbances:none  Narrative Interpretation:   Old EKG Reviewed: unchanged    Joya Gaskins, MD 11/11/12 1009

## 2012-11-11 NOTE — H&P (Signed)
  I have examined Mr. Larry West and he knows he needs surgical fixation of his right hip fracture.  Rexene Edison, PA has also reviewed this with him and I agree with the findings in his consult note.  The risks of surgery are blood loss, infection, DVT, and death.  The goals are decreased pain and improved mobility.

## 2012-11-11 NOTE — Consult Note (Signed)
Reason for Consult:right hip fracture Referring Physician: Alanzo, Larry West is an 79 y.o. male.  HPI: Patient reports he was at home and slipped on a towel in the bathroom. Denies LOC but has vertigo and was dizzy at the time.Unable right unable to ambulate. Also reports he hit his head otherwise no other injuries. Reports he had an appointment with Oncology this morning for work-up for "cancer" he does not know what type. Recently stopped PLAVIX over last 2weeks  Past Medical History  Diagnosis Date  . Hypertension   . Chronic low back pain     right leg pain   . Hip pain     left  . Seizures 2006  . ETOHism   . Cataract   . CAD (coronary artery disease) 2001    s/p PTCI  . PVD (posterior vitreous detachment), left eye   . Anemia     CKD  . Colon polyps   . Chronic kidney disease (CKD), stage III (moderate)   . Hyperlipidemia   . Hyperkalemia   . Bone pain 09/16/2012  . Vitamin B12 deficiency anemia 10/21/2012    Past Surgical History  Procedure Laterality Date  . Cataracts surgery    . Appendectomy  1955  . Gastric outlet obstruction surgery  1988  . Lumbar disc surgery      Family History  Problem Relation Age of Onset  . Cancer Son 29    colon cancer    Social History:  reports that he has never smoked. He does not have any smokeless tobacco history on file. He reports that he drinks about 4.8 ounces of alcohol per week. He reports that he does not use illicit drugs.  Allergies:  Allergies  Allergen Reactions  . Darvocet (Propoxyphene-Acetaminophen) Nausea Only  . Penicillins Swelling    Unknown    . Sulfonamide Derivatives Itching    Medications: I have reviewed the patient's current medications.  Results for orders placed during the hospital encounter of 11/11/12 (from the past 48 hour(s))  BASIC METABOLIC PANEL     Status: Abnormal   Collection Time    11/11/12  8:50 AM      Result Value Range   Sodium 134 (*) 135 - 145 mEq/L    Potassium 5.8 (*) 3.5 - 5.1 mEq/L   Chloride 104  96 - 112 mEq/L   CO2 19  19 - 32 mEq/L   Glucose, Bld 102 (*) 70 - 99 mg/dL   BUN 66 (*) 6 - 23 mg/dL   Creatinine, Ser 1.61 (*) 0.50 - 1.35 mg/dL   Calcium 8.6  8.4 - 09.6 mg/dL   GFR calc non Af Amer 24 (*) >90 mL/min   GFR calc Af Amer 28 (*) >90 mL/min   Comment:            The eGFR has been calculated     using the CKD EPI equation.     This calculation has not been     validated in all clinical     situations.     eGFR's persistently     <90 mL/min signify     possible Chronic Kidney Disease.  CBC WITH DIFFERENTIAL     Status: Abnormal   Collection Time    11/11/12  8:50 AM      Result Value Range   WBC 8.9  4.0 - 10.5 K/uL   RBC 2.75 (*) 4.22 - 5.81 MIL/uL   Hemoglobin 9.9 (*) 13.0 - 17.0  g/dL   HCT 96.0 (*) 45.4 - 09.8 %   MCV 108.0 (*) 78.0 - 100.0 fL   MCH 36.0 (*) 26.0 - 34.0 pg   MCHC 33.3  30.0 - 36.0 g/dL   RDW 11.9 (*) 14.7 - 82.9 %   Platelets 230  150 - 400 K/uL   Neutrophils Relative 80 (*) 43 - 77 %   Neutro Abs 7.1  1.7 - 7.7 K/uL   Lymphocytes Relative 12  12 - 46 %   Lymphs Abs 1.1  0.7 - 4.0 K/uL   Monocytes Relative 7  3 - 12 %   Monocytes Absolute 0.6  0.1 - 1.0 K/uL   Eosinophils Relative 1  0 - 5 %   Eosinophils Absolute 0.1  0.0 - 0.7 K/uL   Basophils Relative 0  0 - 1 %   Basophils Absolute 0.0  0.0 - 0.1 K/uL  PROTIME-INR     Status: None   Collection Time    11/11/12  8:50 AM      Result Value Range   Prothrombin Time 12.6  11.6 - 15.2 seconds   INR 0.95  0.00 - 1.49  TYPE AND SCREEN     Status: None   Collection Time    11/11/12  8:50 AM      Result Value Range   ABO/RH(D) O NEG     Antibody Screen PENDING     Sample Expiration 11/14/2012      Dg Chest 1 View  11/11/2012  *RADIOLOGY REPORT*  Clinical Data: Right hip fracture.  Coronary artery disease and hypertension. Pre-op respiratory exam.  CHEST - 1 VIEW  Comparison: 08/23/2012  Findings: Low lung volumes are seen however  both lungs are clear. No evidence of pleural effusion.  Heart size is stable.  No mass or lymphadenopathy identified.  IMPRESSION: Low lung volumes.  No active disease.   Original Report Authenticated By: Myles Rosenthal, M.D.    Dg Hip Complete Right  11/11/2012  *RADIOLOGY REPORT*  Clinical Data: Fall.  Severe right hip pain.  RIGHT HIP - COMPLETE 2+ VIEW  Comparison: None.  Findings: Intertrochanteric right hip fracture is seen with mild varus angulation.  No evidence of dislocation.  No evidence of acetabular pelvic fracture.  Peripheral vascular calcification noted.  IMPRESSION: Intertrochanteric right hip fracture.   Original Report Authenticated By: Myles Rosenthal, M.D.    Ct Head Wo Contrast  11/11/2012  *RADIOLOGY REPORT*  Clinical Data:  Fall with hip pain.  CT HEAD WITHOUT CONTRAST CT CERVICAL SPINE WITHOUT CONTRAST  Technique:  Multidetector CT imaging of the head and cervical spine was performed following the standard protocol without intravenous contrast.  Multiplanar CT image reconstructions of the cervical spine were also generated.  Comparison:  08/20/2012.  CT HEAD  Findings: No evidence of acute infarct, acute hemorrhage, mass lesion, mass effect or hydrocephalus.  Atrophy.  Periventricular low attenuation.  Scattered mucosal thickening in the visualized paranasal sinuses. Mastoid air cells are clear.  IMPRESSION:  1.  No acute intracranial abnormality. 2.  Atrophy and chronic microvascular white matter ischemic changes.  CT CERVICAL SPINE  Findings: 4 mm anterolisthesis of C7 on T1, most likely degenerative in etiology.  Alignment is otherwise anatomic. Multilevel uncovertebral and facet hypertrophy with endplate degenerative changes and loss of disc space height, from C3-4 to C7- T1.  Findings are worst at C4-5 and C6-7.  And C3-4, moderate bilateral neural foraminal narrowing.  At C4-5, moderate right, and mild left neural foraminal  narrowing.  At C5-6, mild bilateral neural foraminal narrowing.   At C6-7, moderate bilateral neural foraminal narrowing.  Visualized portions of the lung apices are clear.  Low attenuation lesions in the thyroid measure up to approximately 7 mm on the right.  Soft tissues are otherwise unremarkable.  IMPRESSION:  1.  No acute fracture or subluxation. 2.  Advanced multilevel spondylosis.   Original Report Authenticated By: Leanna Battles, M.D.    Ct Cervical Spine Wo Contrast  11/11/2012  *RADIOLOGY REPORT*  Clinical Data:  Fall with hip pain.  CT HEAD WITHOUT CONTRAST CT CERVICAL SPINE WITHOUT CONTRAST  Technique:  Multidetector CT imaging of the head and cervical spine was performed following the standard protocol without intravenous contrast.  Multiplanar CT image reconstructions of the cervical spine were also generated.  Comparison:  08/20/2012.  CT HEAD  Findings: No evidence of acute infarct, acute hemorrhage, mass lesion, mass effect or hydrocephalus.  Atrophy.  Periventricular low attenuation.  Scattered mucosal thickening in the visualized paranasal sinuses. Mastoid air cells are clear.  IMPRESSION:  1.  No acute intracranial abnormality. 2.  Atrophy and chronic microvascular white matter ischemic changes.  CT CERVICAL SPINE  Findings: 4 mm anterolisthesis of C7 on T1, most likely degenerative in etiology.  Alignment is otherwise anatomic. Multilevel uncovertebral and facet hypertrophy with endplate degenerative changes and loss of disc space height, from C3-4 to C7- T1.  Findings are worst at C4-5 and C6-7.  And C3-4, moderate bilateral neural foraminal narrowing.  At C4-5, moderate right, and mild left neural foraminal narrowing.  At C5-6, mild bilateral neural foraminal narrowing.  At C6-7, moderate bilateral neural foraminal narrowing.  Visualized portions of the lung apices are clear.  Low attenuation lesions in the thyroid measure up to approximately 7 mm on the right.  Soft tissues are otherwise unremarkable.  IMPRESSION:  1.  No acute fracture or subluxation.  2.  Advanced multilevel spondylosis.   Original Report Authenticated By: Leanna Battles, M.D.     Review of Systems  Constitutional: Positive for weight loss and malaise/fatigue.  HENT: Positive for hearing loss. Negative for congestion, sore throat and tinnitus.   Eyes: Negative.        H/o cataract surgery bilat  Respiratory: Positive for shortness of breath.   Cardiovascular: Negative for chest pain, palpitations and orthopnea.       H/o cardiac stent  Gastrointestinal: Positive for heartburn. Negative for nausea, vomiting, diarrhea, constipation, blood in stool and melena.  Genitourinary: Positive for frequency. Negative for dysuria, urgency, hematuria and flank pain.       Renal disease  Musculoskeletal:       Acute Right hip pain History of right upper extremity injury ? Shoulder dislocation with residual decrease sensation right hand   Neurological: Positive for dizziness and seizures. Negative for loss of consciousness and headaches.       Vertigo  Endo/Heme/Allergies: Bruises/bleeds easily.       H/o DVT left   Blood pressure 199/83, pulse 60, temperature 98 F (36.7 C), temperature source Oral, resp. rate 16, SpO2 99.00%. Physical Exam  Constitutional: He is oriented to person, place, and time. He appears well-developed and well-nourished.  HENT:  Head: Normocephalic.  Eyes: EOM are normal.  Cardiovascular: Normal rate, regular rhythm and intact distal pulses.   Respiratory: Effort normal and breath sounds normal.  Musculoskeletal:  Right lower leg slight shortening and external rotation Left hip no pain with GROM. Bilateral lower legs without gross deformities or tenderness to  palpation Calves supple non tender bilateral EHL/FHL intact bilateral. Dorsiflexion plantar flexion intact bilateral Upper extremities GROM without pain no gross deformities. Decreased right hand sensation throughout.  Neurological: He is alert and oriented to person, place, and time.  Skin:  Skin is warm and dry.  Psychiatric: He has a normal mood and affect.    Assessment/Plan:  History of DVT History of stroke CAD Chronic renal disease stage 3 Anemia- will cross and type for 2 units of PRBC's  On hold to OR Seizures  ETOH abuse Hyperkalemia  Acute Right hip inter troch fracture- will need right hip IM nailing when medically optimized. NPO  Bedrest  Richardean Canal 11/11/2012, 9:33 AM

## 2012-11-12 ENCOUNTER — Encounter (HOSPITAL_COMMUNITY): Payer: Self-pay | Admitting: Orthopaedic Surgery

## 2012-11-12 LAB — BASIC METABOLIC PANEL
BUN: 57 mg/dL — ABNORMAL HIGH (ref 6–23)
Calcium: 8.2 mg/dL — ABNORMAL LOW (ref 8.4–10.5)
Creatinine, Ser: 2.37 mg/dL — ABNORMAL HIGH (ref 0.50–1.35)
GFR calc Af Amer: 27 mL/min — ABNORMAL LOW (ref >90)
GFR calc non Af Amer: 24 mL/min — ABNORMAL LOW (ref >90)
Glucose, Bld: 125 mg/dL — ABNORMAL HIGH (ref 70–99)
Potassium: 5.5 mEq/L — ABNORMAL HIGH (ref 3.5–5.1)

## 2012-11-12 LAB — CBC
MCH: 35.1 pg — ABNORMAL HIGH (ref 26.0–34.0)
MCHC: 32.5 g/dL (ref 30.0–36.0)
Platelets: 183 10*3/uL (ref 150–400)
RDW: 15.6 % — ABNORMAL HIGH (ref 11.5–15.5)

## 2012-11-12 LAB — PREPARE RBC (CROSSMATCH)

## 2012-11-12 MED ORDER — ENOXAPARIN SODIUM 30 MG/0.3ML ~~LOC~~ SOLN
30.0000 mg | SUBCUTANEOUS | Status: DC
  Administered 2012-11-12 – 2012-11-16 (×5): 30 mg via SUBCUTANEOUS
  Filled 2012-11-12 (×5): qty 0.3

## 2012-11-12 MED ORDER — SODIUM CHLORIDE 0.9 % IV SOLN
INTRAVENOUS | Status: DC
  Administered 2012-11-12: 1000 mL via INTRAVENOUS
  Administered 2012-11-12 – 2012-11-13 (×3): via INTRAVENOUS

## 2012-11-12 MED ORDER — FUROSEMIDE 10 MG/ML IJ SOLN
20.0000 mg | Freq: Once | INTRAMUSCULAR | Status: AC
  Administered 2012-11-12: 20 mg via INTRAVENOUS
  Filled 2012-11-12: qty 2

## 2012-11-12 MED ORDER — SODIUM POLYSTYRENE SULFONATE 15 GM/60ML PO SUSP
15.0000 g | Freq: Once | ORAL | Status: AC
  Administered 2012-11-12: 15 g via ORAL
  Filled 2012-11-12: qty 60

## 2012-11-12 NOTE — Progress Notes (Signed)
[facility] PROGRESS NOTE  RIDGE LAFOND ZOX:[id number] DOB: August 19, 1926 DOA: 11/11/2012 PCP: Lynnea Ferrier [name], MD  Assessment/Plan:  R hip fracture s/p ORIF: 3/13 per Dr.Blackman - Pt/OT -wound care -DVT prophylaxis with lovenox  Acute post op blood loss anemia: - ordered to get 2 units PRBC today, lasix in between, monitor volume status closely  HTN (hypertension)  -Continue Norvasc. Lopressor when necessary ordered.   Seizures  -Continue Dilantin. Dilantin level 14.5.   Chronic kidney disease (CKD), stage III (moderate)  -Creatinine appears to be at usual baseline values.   Anemia of chronic disease  -received a dose of aranesp yesterday   Fall  -PT and OT evaluations postoperatively.   Hyperkalemia  -repeat Kayexalate today      Code Status: Full Family Communication: none at bedside Disposition Plan: to be determined   Consultants:  ORtho Dr.Blackman  Procedures: PROCEDURE: Open reduction and internal fixation of right  intertrochanteric hip fracture.on 3/13 per Dr.Blackman   HPI/Subjective: Feels ok, some R hip pain  Objective: Filed Vitals:   11/12/12 0837 11/12/12 0906 11/12/12 1005 11/12/12 1101  BP: 142/68 166/70 169/79 157/66  Pulse: 63 71 69 65  Temp: 97.8 F (36.6 C) 97.9 F (36.6 C) 97.6 F (36.4 C) 97.6 F (36.4 C)  TempSrc: Oral Oral Oral Oral  Resp: 16 16 16 16   Height:      Weight:      SpO2: 100%       Intake/Output Summary (Last 24 hours) at 11/12/12 1117 Last data filed at 11/12/12 0837  Gross per 24 hour  Intake   1475 ml  Output   1175 ml  Net    300 ml   Filed Weights   11/11/12 1636 11/11/12 1811  Weight: 68.04 kg (150 lb) 66.679 kg (147 lb)    Exam:   General:  AAOx3  Cardiovascular: S1S2/RRR  Respiratory: CTAB  Abdomen: soft, NT, BS present  Musculoskeletal: R hip incision c/d/i  Data Reviewed: Basic Metabolic Panel:  Recent Labs Lab 11/11/12 0850 11/11/12 1100 11/12/12 0504  NA  134* 134* 135  K 5.8* 5.7* 5.5*  CL 104 105 107  CO2 19 17* 20  GLUCOSE 102* 103* 125*  BUN 66* 66* 57*  CREATININE 2.32* 2.29* 2.37*  CALCIUM 8.6 8.2* 8.2*   Liver Function Tests:  Recent Labs Lab 11/11/12 1115  AST 32  ALT 19  ALKPHOS 119*  BILITOT 0.1*  PROT 6.5  ALBUMIN 2.7*   No results found for this basename: LIPASE, AMYLASE,  in the last 168 hours No results found for this basename: AMMONIA,  in the last 168 hours CBC:  Recent Labs Lab 11/11/12 0850 11/12/12 0504  WBC 8.9 5.2  NEUTROABS 7.1  --   HGB 9.9* 7.4*  HCT 29.7* 22.8*  MCV 108.0* 108.1*  PLT 230 183   Cardiac Enzymes: No results found for this basename: CKTOTAL, CKMB, CKMBINDEX, TROPONINI,  in the last 168 hours BNP (last 3 results) No results found for this basename: PROBNP,  in the last 8760 hours CBG: No results found for this basename: GLUCAP,  in the last 168 hours  Recent Results (from the past 240 hour(s))  SURGICAL PCR SCREEN     Status: None   Collection Time    11/11/12  4:30 PM      Result Value Range Status   MRSA, PCR NEGATIVE  NEGATIVE Final   Staphylococcus aureus NEGATIVE  NEGATIVE Final   Comment:  The Xpert SA Assay (FDA     approved for NASAL specimens     in patients over 77 years of age),     is one component of     a comprehensive surveillance     program.  Test performance has     been validated by [employer] for patients greater     than or equal to 77 year old.     It is not intended     to diagnose infection nor to     guide or monitor treatment.     Studies: Dg Chest 1 View  11/11/2012  *RADIOLOGY REPORT*  Clinical Data: Right hip fracture.  Coronary artery disease and hypertension. Pre-op respiratory exam.  CHEST - 1 VIEW  Comparison: 08/23/2012  Findings: Low lung volumes are seen however both lungs are clear. No evidence of pleural effusion.  Heart size is stable.  No mass or lymphadenopathy identified.  IMPRESSION: Low lung volumes.  No  active disease.   Original Report Authenticated By: Myles Rosenthal, M.D.    Dg Hip Complete Right  11/11/2012  *RADIOLOGY REPORT*  Clinical Data: Fall.  Severe right hip pain.  RIGHT HIP - COMPLETE 2+ VIEW  Comparison: None.  Findings: Intertrochanteric right hip fracture is seen with mild varus angulation.  No evidence of dislocation.  No evidence of acetabular pelvic fracture.  Peripheral vascular calcification noted.  IMPRESSION: Intertrochanteric right hip fracture.   Original Report Authenticated By: Myles Rosenthal, M.D.    Dg Femur Right  11/12/2012  *RADIOLOGY REPORT*  Clinical Data: Femur fracture  DG C-ARM 1-60 MIN - NRPT [facility],RIGHT FEMUR - 2 VIEW  Comparison: 700 hours  Findings: A dynamic compression screw and intramedullary rod transfixes an intertrochanteric femur fracture.  No breakage or loosening of the hardware.  Anatomic alignment.  IMPRESSION: ORIF right intertrochanteric femur fracture.   Original Report Authenticated By: Jolaine Click, M.D.    Ct Head Wo Contrast  11/11/2012  *RADIOLOGY REPORT*  Clinical Data:  Fall with hip pain.  CT HEAD WITHOUT CONTRAST CT CERVICAL SPINE WITHOUT CONTRAST  Technique:  Multidetector CT imaging of the head and cervical spine was performed following the standard protocol without intravenous contrast.  Multiplanar CT image reconstructions of the cervical spine were also generated.  Comparison:  08/20/2012.  CT HEAD  Findings: No evidence of acute infarct, acute hemorrhage, mass lesion, mass effect or hydrocephalus.  Atrophy.  Periventricular low attenuation.  Scattered mucosal thickening in the visualized paranasal sinuses. Mastoid air cells are clear.  IMPRESSION:  1.  No acute intracranial abnormality. 2.  Atrophy and chronic microvascular white matter ischemic changes.  CT CERVICAL SPINE  Findings: 4 mm anterolisthesis of C7 on T1, most likely degenerative in etiology.  Alignment is otherwise anatomic. Multilevel uncovertebral and facet hypertrophy with endplate  degenerative changes and loss of disc space height, from C3-4 to C7- T1.  Findings are worst at C4-5 and C6-7.  And C3-4, moderate bilateral neural foraminal narrowing.  At C4-5, moderate right, and mild left neural foraminal narrowing.  At C5-6, mild bilateral neural foraminal narrowing.  At C6-7, moderate bilateral neural foraminal narrowing.  Visualized portions of the lung apices are clear.  Low attenuation lesions in the thyroid measure up to approximately 7 mm on the right.  Soft tissues are otherwise unremarkable.  IMPRESSION:  1.  No acute fracture or subluxation. 2.  Advanced multilevel spondylosis.   Original Report Authenticated By: Leanna Battles, M.D.  Ct Cervical Spine Wo Contrast  11/11/2012  *RADIOLOGY REPORT*  Clinical Data:  Fall with hip pain.  CT HEAD WITHOUT CONTRAST CT CERVICAL SPINE WITHOUT CONTRAST  Technique:  Multidetector CT imaging of the head and cervical spine was performed following the standard protocol without intravenous contrast.  Multiplanar CT image reconstructions of the cervical spine were also generated.  Comparison:  08/20/2012.  CT HEAD  Findings: No evidence of acute infarct, acute hemorrhage, mass lesion, mass effect or hydrocephalus.  Atrophy.  Periventricular low attenuation.  Scattered mucosal thickening in the visualized paranasal sinuses. Mastoid air cells are clear.  IMPRESSION:  1.  No acute intracranial abnormality. 2.  Atrophy and chronic microvascular white matter ischemic changes.  CT CERVICAL SPINE  Findings: 4 mm anterolisthesis of C7 on T1, most likely degenerative in etiology.  Alignment is otherwise anatomic. Multilevel uncovertebral and facet hypertrophy with endplate degenerative changes and loss of disc space height, from C3-4 to C7- T1.  Findings are worst at C4-5 and C6-7.  And C3-4, moderate bilateral neural foraminal narrowing.  At C4-5, moderate right, and mild left neural foraminal narrowing.  At C5-6, mild bilateral neural foraminal  narrowing.  At C6-7, moderate bilateral neural foraminal narrowing.  Visualized portions of the lung apices are clear.  Low attenuation lesions in the thyroid measure up to approximately 7 mm on the right.  Soft tissues are otherwise unremarkable.  IMPRESSION:  1.  No acute fracture or subluxation. 2.  Advanced multilevel spondylosis.   Original Report Authenticated By: Leanna Battles, M.D.    Dg C-arm 1-60 Min-no Report  11/11/2012  CLINICAL DATA: surgery   C-ARM 1-60 MINUTES  Fluoroscopy was utilized by the requesting physician.  No radiographic  interpretation.      Scheduled Meds: . amLODipine  10 mg Oral q morning - 10a  . aspirin EC  325 mg Oral Q breakfast  . atorvastatin  40 mg Oral QHS  . calcitRIOL  0.25 mcg Oral q morning - 10a  . [START ON 11/15/2012] darbepoetin  300 mcg Subcutaneous Q28 days  . docusate sodium  100 mg Oral BID  . enoxaparin (LOVENOX) injection  30 mg Subcutaneous Q24H  . ferrous sulfate  325 mg Oral TID PC  . phenytoin  200 mg Oral QHS  . sodium chloride  3 mL Intravenous Q12H  . sodium polystyrene  15 g Oral Once   Continuous Infusions: . sodium chloride 75 mL/hr at 11/12/12 9562    Principal Problem:   Closed right hip fracture Active Problems:   HTN (hypertension)   Seizures   Chronic kidney disease (CKD), stage III (moderate)   Anemia of chronic disease   Fall   Hyperkalemia    Time spent:    [facility]  [facility] Pager [phone]. If 7PM-7AM, please contact night-coverage at [email or web address], password [email or web address] 11/12/2012, 11:17 AM  LOS: 1 day

## 2012-11-12 NOTE — Progress Notes (Signed)
Patient refuses to allow foley catheter to be removed. States he cannot use bedpan or urinal. Dr to be notified

## 2012-11-12 NOTE — Progress Notes (Signed)
   CARE MANAGEMENT NOTE 11/12/2012  Patient:  Larry West, Larry West   Account Number:  [id number]  Date Initiated:  11/12/2012  Documentation initiated by:  Jiles Crocker  Subjective/Objective Assessment:   ADMITTED FOR SURGERY RIGHT HIP NAILING     Action/Plan:   PCP: Leo Grosser, MD  LIVES AT HOME; SOC WORKER REFERRAL PLACED FOR POSSIBLE SNF PLACMENT   Anticipated DC Date:  11/17/2012   Anticipated DC Plan:  SKILLED NURSING FACILITY  In-house referral  Clinical Social Worker      DC Planning Services  CM consult          Status of service:  In process, will continue to follow Medicare Important Message given?  NA - LOS <3 / Initial given by admissions (If response is "NO", the following Medicare IM given date fields will be blank) Per UR Regulation:  Reviewed for med. necessity/level of care/duration of stay Comments:  11/12/2012- B Aundreya Souffrant RN,BSN,MHA

## 2012-11-12 NOTE — Evaluation (Signed)
Physical Therapy Evaluation Patient Details Name: Larry West MRN: [id number] DOB: [birth year] Today's Date: 11/12/2012 Time: 7829-5621 PT Time Calculation (min): 33 min  PT Assessment / Plan / Recommendation Clinical Impression  Pt presents with R hip fracture s/p IM nail POD 1 with decreased strength, ROM and mobility.  Tolerated OOB and took some side steps towards [facility], however refused to attempt to get into chair today.  Pt will benefit from skilled PT in acute venue to address deficits.  PT recommends SNF at D/C to maximize pts safety and function for safe return home.     PT Assessment  Patient needs continued PT services    Follow Up Recommendations  SNF;Supervision/Assistance - 24 hour    Does the patient have the potential to tolerate intense rehabilitation      Barriers to Discharge Decreased caregiver support Requires +2 assist at this time    Equipment Recommendations  Rolling walker with 5" wheels    Recommendations for Other Services OT consult   Frequency Min 3X/week    Precautions / Restrictions Precautions Precautions: Fall Restrictions Weight Bearing Restrictions: No Other Position/Activity Restrictions: WBAT   Pertinent Vitals/Pain Pt with increased pain in R hip, also has dizziness when being upright (states he has vertigo and takes Meclizine at home)      Mobility  Bed Mobility Bed Mobility: Supine to Sit;Sitting - Scoot to Edge of Bed Supine to Sit: 1: +2 Total assist;HOB elevated;With rails Supine to Sit: Patient Percentage: 50% Sitting - Scoot to Edge of Bed: 1: +2 Total assist Sitting - Scoot to Edge of Bed: Patient Percentage: 60% Details for Bed Mobility Assistance: Assist for RLE out of bed and for trunk to attain sitting position with max cues for hand placement on bed instead of reaching for therapist.  Transfers Transfers: Sit to Stand;Stand to Sit Sit to Stand: 1: +2 Total assist;From elevated surface;With upper extremity assist;From  bed Sit to Stand: Patient Percentage: 60% Stand to Sit: 1: +2 Total assist;With upper extremity assist;To bed Stand to Sit: Patient Percentage: 60% Details for Transfer Assistance: Performed x 2 with assist to rise, steady and ensure controlled descent with max cues for upright posture, hand placement and safety when sitting. Pt refused to get into chair, however did take a few side steps towards [facility] with +2 assist for safety.  Ambulation/Gait Ambulation/Gait Assistance: 1: +2 Total assist Ambulation/Gait: Patient Percentage: 50% Ambulation Distance (Feet): 3 Feet Assistive device: Rolling walker Ambulation/Gait Assistance Details: Again, pt able to take a few side steps towards [facility] with cues for sequencing/technique with RW.  Gait Pattern: Step-to pattern;Decreased stride length Stairs: No Wheelchair Mobility Wheelchair Mobility: No    Exercises     PT Diagnosis: Difficulty walking;Generalized weakness;Acute pain  PT Problem List: Decreased strength;Decreased range of motion;Decreased activity tolerance;Decreased balance;Decreased mobility;Decreased coordination;Decreased knowledge of use of DME;Decreased knowledge of precautions;Pain PT Treatment Interventions: DME instruction;Gait training;Functional mobility training;Therapeutic activities;Therapeutic exercise;Balance training;Patient/family education   PT Goals Acute Rehab PT Goals PT Goal Formulation: With patient Time For Goal Achievement: 11/19/12 Potential to Achieve Goals: Good Pt will go Supine/Side to Sit: with min assist PT Goal: Supine/Side to Sit - Progress: Goal set today Pt will go Sit to Supine/Side: with min assist PT Goal: Sit to Supine/Side - Progress: Goal set today Pt will go Sit to Stand: with min assist PT Goal: Sit to Stand - Progress: Goal set today Pt will go Stand to Sit: with min assist PT Goal: Stand to Sit - Progress:  Goal set today Pt will Ambulate: 16 - 50 feet;with mod assist;with least  restrictive assistive device PT Goal: Ambulate - Progress: Goal set today Pt will Perform Home Exercise Program: with supervision, verbal cues required/provided PT Goal: Perform Home Exercise Program - Progress: Goal set today  Visit Information  Last PT Received On: 11/12/12 Assistance Needed: +2    Subjective Data  Subjective: I'm not getting in that chair.  Patient Stated Goal: to return home.    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, advertising Overall Cognitive Status: Appears within functional limits for tasks assessed/performed Arousal/Alertness: Awake/alert Orientation Level: Appears intact for tasks assessed Behavior During Session: [facility] for tasks performed    Extremity/Trunk Assessment Right Lower Extremity Assessment RLE ROM/Strength/Tone: Unable to fully assess;Due to pain RLE Sensation: WFL - Light Touch Left Lower Extremity Assessment LLE ROM/Strength/Tone: WFL for tasks assessed LLE Sensation: WFL - Light Touch Trunk Assessment Trunk Assessment: Kyphotic   Balance    End of Session PT - End of Session Equipment Utilized During Treatment: Gait belt Activity Tolerance: Patient limited by pain Patient left: in bed;with call bell/phone within reach Nurse Communication: Mobility status  GP     Vista Deck 11/12/2012, 5:43 PM

## 2012-11-12 NOTE — Op Note (Signed)
NAMEMADOC, HOLQUIN                ACCOUNT NO.:  [id number]  MEDICAL RECORD NO.:  [id number]  LOCATION:  1428                         FACILITY:  [facility]  PHYSICIAN:  Vanita Panda. Magnus Ivan, M.D.DATE OF BIRTH:  [birth year]  DATE OF PROCEDURE:  11/11/2012 DATE OF DISCHARGE:                              OPERATIVE REPORT   PREOPERATIVE DIAGNOSIS:  Right intertrochanteric femur/hip fracture.  POSTOPERATIVE DIAGNOSIS:  Right intertrochanteric femur/hip fracture.  PROCEDURE:  Open reduction and internal fixation of right intertrochanteric hip fracture.  IMPLANTS:  [employer] 10 x 38 right intramedullary nail with a 95- mm lag screw and compression screw.  SURGEON:  Vanita Panda. Magnus Ivan, M.D.  ASSISTING:  Richardean Canal, P.A.  ANESTHESIA:  General.  ANTIBIOTICS:  Ancef IV.  BLOOD LOSS:  Less than 200 mL.  COMPLICATIONS:  None.  INDICATIONS:  Mr. Larry West is an 79 year old with multiple medical problems including chronic anemia.  He is a cachectic type of individual who had a mechanical fall, tripped over a towel in his bathroom this morning. He was seen at the [facility] Emergency Room and found to have an intertrochanteric hip fracture on the right hip.  He was graciously seen and admitted by the Hospitalist Service and cleared for surgery for this evening.  I have explained in detail the risks and benefits of surgery to him and he does wish to proceed given the amount of pain and all of this that impact on his life having this broken hip.  The risks and benefits of surgery have been explained in detail including the risk of acute blood loss anemia, infection, DVT, PE, and even death.  The goals are increased mobility, decreased pain and increased function to lessen the risk of blood clots, bedsores, and pneumonia.  PROCEDURE DESCRIPTION:  After informed consent was obtained, appropriate right hip was marked.  He was brought to the operating room and general anesthesia  was obtained while he was on his stretcher.  A Foley catheter was placed and then he was placed supine on the fracture table.  His right foot was placed in the traction boot and traction was applied.  A perineal post was placed and then the left nonoperative hip was flexed and abducted out of field in an abduction stirrup with appropriate padding in the popliteal area.  Under direct fluoroscopic guidance, we then assessed the right hip fracture and retraction, internal rotation, and reduced it into near-anatomic position.  We then prepped the right hip with DuraPrep and a sterile shower curtain drape.  Time-out was called and he was identified as correct patient and correct right hip. We then made an incision just proximal to the greater trochanter and dissected down to the tip of the greater trochanter.  Under direct fluoroscopy, a guidepin was then inserted in an antegrade fashion.  We then used an initiating reamer to open up the femoral canal and then preoperatively I already chosen a 10 x 38 right intramedullary rod and placed this in an antegrade fashion down the femur.  Using the outrigger guide, that was soaked to this.  We made a separate incision further down his thigh on the lateral aspect.  I placed  then a separate guidepin through the lateral cortex of the femur traversing the fracture into the center-center position of the femoral head.  We then took a measurement off of this and chose a 95-mm lag screw.  We drilled to this depth and then placed a 95-mm lag screw without problems.  We then placed the light traction off the leg and placed a compression function, which compressed the fracture.  We then took the outrigger guide off the leg, took a final C-arm images with fluoroscopy and then irrigated the two small wounds with normal saline solution.  We closed the deep tissue with 0 Vicryl followed by 2-0 Vicryl in the subcutaneous tissue and interrupted staples on the skin.  A  well-padded sterile dressing was applied.  He was taken off of the fracture table, awakened, extubated and taken to the recovery room in stable condition.  All final counts were correct.  There were no complications noted.     Vanita Panda. Magnus Ivan, M.D.     CYB/MEDQ  D:  11/11/2012  T:  11/12/2012  Job:  [id number]

## 2012-11-12 NOTE — Progress Notes (Signed)
Subjective: 1 Day Post-Op Procedure(s) (LRB): INTRAMEDULLARY (IM) NAIL FEMORAL (Right) Patient reports pain as moderate.  Acute on chronic blood loss anemia. Vitals stable.  Objective: Vital signs in last 24 hours: Temp:  [97 F (36.1 C)-99 F (37.2 C)] 98.2 F (36.8 C) (03/14 0552) Pulse Rate:  [57-111] 70 (03/14 0552) Resp:  [11-22] 20 (03/14 0552) BP: (149-218)/(66-114) 162/70 mmHg (03/14 0552) SpO2:  [98 %-100 %] 100 % (03/14 0552) Weight:  [66.679 kg (147 lb)-68.04 kg (150 lb)] 66.679 kg (147 lb) (03/13 1811)  Intake/Output from previous day: 03/13 0701 - 03/14 0700 In: 1150 [I.V.:1150] Out: 1175 [Urine:1125; Blood:50] Intake/Output this shift:     Recent Labs  11/11/12 0850 11/12/12 0504  HGB 9.9* 7.4*    Recent Labs  11/11/12 0850 11/12/12 0504  WBC 8.9 5.2  RBC 2.75* 2.11*  HCT 29.7* 22.8*  PLT 230 183    Recent Labs  11/11/12 1100 11/12/12 0504  NA 134* 135  K 5.7* 5.5*  CL 105 107  CO2 17* 20  BUN 66* 57*  CREATININE 2.29* 2.37*  GLUCOSE 103* 125*  CALCIUM 8.2* 8.2*    Recent Labs  11/11/12 0850  INR 0.95    Sensation intact distally Intact pulses distally Dorsiflexion/Plantar flexion intact Incision: scant drainage  Assessment/Plan: 1 Day Post-Op Procedure(s) (LRB): INTRAMEDULLARY (IM) NAIL FEMORAL (Right) Up with therapy, WBAT right hip, ADL's Social Work consult for likely placement unless does well with therapy. Transfuse 2 units today.  BLACKMAN,CHRISTOPHER Y 11/12/2012, 7:26 AM

## 2012-11-13 DIAGNOSIS — M545 Low back pain: Secondary | ICD-10-CM

## 2012-11-13 DIAGNOSIS — G8929 Other chronic pain: Secondary | ICD-10-CM

## 2012-11-13 DIAGNOSIS — D62 Acute posthemorrhagic anemia: Secondary | ICD-10-CM

## 2012-11-13 LAB — TYPE AND SCREEN: Unit division: 0

## 2012-11-13 LAB — CBC
HCT: 25.7 % — ABNORMAL LOW (ref 39.0–52.0)
Hemoglobin: 8.9 g/dL — ABNORMAL LOW (ref 13.0–17.0)
MCHC: 34.6 g/dL (ref 30.0–36.0)
RBC: 2.69 MIL/uL — ABNORMAL LOW (ref 4.22–5.81)

## 2012-11-13 LAB — BASIC METABOLIC PANEL
BUN: 49 mg/dL — ABNORMAL HIGH (ref 6–23)
CO2: 19 mEq/L (ref 19–32)
Glucose, Bld: 115 mg/dL — ABNORMAL HIGH (ref 70–99)
Potassium: 3.8 mEq/L (ref 3.5–5.1)
Sodium: 134 mEq/L — ABNORMAL LOW (ref 135–145)

## 2012-11-13 MED ORDER — TRAMADOL HCL 50 MG PO TABS: 50.0000 mg | ORAL_TABLET | Freq: Four times a day (QID) | ORAL | Status: DC | PRN

## 2012-11-13 MED ORDER — ASPIRIN EC 325 MG PO TBEC: 325.0000 mg | DELAYED_RELEASE_TABLET | Freq: Every day | ORAL | Status: DC

## 2012-11-13 NOTE — Progress Notes (Signed)
Physical Therapy Treatment Patient Details Name: Larry West MRN: [id number] DOB: 13-Sep-1925 Today's Date: 11/13/2012 Time: 4098-1191 PT Time Calculation (min): 37 min  PT Assessment / Plan / Recommendation Comments on Treatment Session  Pt able to ambulate approx 18' today with +2 assist for safety.  continue to recommend ST SNF for follow up.     Follow Up Recommendations  SNF;Supervision/Assistance - 24 hour     Does the patient have the potential to tolerate intense rehabilitation     Barriers to Discharge        Equipment Recommendations  Rolling walker with 5" wheels    Recommendations for Other Services OT consult  Frequency Min 3X/week   Plan Discharge plan remains appropriate    Precautions / Restrictions Precautions Precautions: Fall Restrictions Weight Bearing Restrictions: No Other Position/Activity Restrictions: WBAT   Pertinent Vitals/Pain Pt with c/o pain during mobility.  RN aware     Mobility  Bed Mobility Bed Mobility: Supine to Sit;Sit to Supine Supine to Sit: 1: +2 Total assist Supine to Sit: Patient Percentage: 50% Sit to Supine: 1: +2 Total assist;HOB flat Sit to Supine: Patient Percentage: 50% Details for Bed Mobility Assistance: Assist for RLE out of bed and BLEs into bed with assist both ways for trunk as well.  MAX cues for pt participation and technique/hand placement.  Transfers Transfers: Sit to Stand;Stand to Sit Sit to Stand: 1: +2 Total assist;From bed;From elevated surface Sit to Stand: Patient Percentage: 60% Stand to Sit: 1: +2 Total assist;To chair/3-in-1;To bed Stand to Sit: Patient Percentage: 60% Details for Transfer Assistance: Performed x 2 to rest in chair then back to bed with assist to rise, steady and ensure controlled descent with cues for hand placement and LE management.  Ambulation/Gait Ambulation/Gait Assistance: 1: +2 Total assist Ambulation/Gait: Patient Percentage: 60% Ambulation Distance (Feet): 18  Feet Assistive device: Rolling walker Ambulation/Gait Assistance Details: cues for sequencing/technique with RW, maintaining upright posture and max cues for encouragement to continue with amb.  Gait Pattern: Step-to pattern;Decreased stride length    Exercises     PT Diagnosis:    PT Problem List:   PT Treatment Interventions:     PT Goals Acute Rehab PT Goals PT Goal Formulation: With patient Time For Goal Achievement: 11/19/12 Potential to Achieve Goals: Good Pt will go Supine/Side to Sit: with min assist PT Goal: Supine/Side to Sit - Progress: Progressing toward goal Pt will go Sit to Supine/Side: with min assist PT Goal: Sit to Supine/Side - Progress: Progressing toward goal Pt will go Sit to Stand: with min assist PT Goal: Sit to Stand - Progress: Progressing toward goal Pt will go Stand to Sit: with min assist PT Goal: Stand to Sit - Progress: Progressing toward goal Pt will Ambulate: 16 - 50 feet;with mod assist;with least restrictive assistive device PT Goal: Ambulate - Progress: Progressing toward goal  Visit Information  Last PT Received On: 11/13/12 Assistance Needed: +2    Subjective Data  Subjective: I just can't do this.  Patient Stated Goal: to return home.    Cognition  Cognition Overall Cognitive Status: Appears within functional limits for tasks assessed/performed Arousal/Alertness: Awake/alert Orientation Level: Appears intact for tasks assessed Behavior During Session: [facility] for tasks performed    Balance     End of Session PT - End of Session Activity Tolerance: Patient limited by pain Patient left: in bed;with call bell/phone within reach Nurse Communication: Mobility status   GP     Ragen Laver, Meribeth Mattes  11/13/2012, 5:26 PM

## 2012-11-13 NOTE — Progress Notes (Signed)
Clinical Social Work Department BRIEF PSYCHOSOCIAL ASSESSMENT 11/13/2012  Patient:  Larry West, Larry West     Account Number:  [id number]     Admit date:  11/11/2012  Clinical Social Worker:  Doroteo Glassman  Date/Time:  11/13/2012 01:00 PM  Referred by:  Physician  Date Referred:  11/13/2012 Referred for  SNF Placement   Other Referral:   Interview type:  Other - See comment Other interview type:   Pt's caretaker, Ok Anis    PSYCHOSOCIAL DATA Living Status:  FRIEND(S) Admitted from facility:   Level of care:   Primary support name:  Ok Anis Primary support relationship to patient:  FRIEND Degree of support available:   strong    CURRENT CONCERNS Current Concerns  Post-Acute Placement   Other Concerns:    SOCIAL WORK ASSESSMENT / PLAN CSW met with Pt's caretaker, Mrs. Katrinka Blazing.  Mr. Duross predisposed.     Mrs. Katrinka Blazing stated that Pt lives with her, as he has no family involvement.  Mrs. Katrinka Blazing stated that Pt is never home alone, however both she and Pt feel that SNF is the better option.    Mrs. Katrinka Blazing stated that, in addition to [facility], Pt has M'caid, however the hospital is stating that he does not. Mrs. Katrinka Blazing asking that the financial counselor check to see if Pt's M'caid can be verified.  CSW to notify weekday CSW of Mrs. Michaelle Copas request.    Permission received to send Pt's information to [facility].    CSW thanked Mrs. Smith for her time.   Assessment/plan status:  Psychosocial Support/Ongoing Assessment of Needs Other assessment/ plan:   Information/referral to community resources:   [place] SNFs    PATIENT'S/FAMILY'S RESPONSE TO PLAN OF CARE: Mrs. Katrinka Blazing thanked CSW for time and assistance.   CSW to continue to follow.  Providence Crosby, LCSWA Clinical Social Work [phone]

## 2012-11-13 NOTE — Evaluation (Signed)
Occupational Therapy Evaluation Patient Details Name: Larry West MRN: [id number] DOB: [birth year] Today's Date: 11/13/2012 Time: 4098-1191 OT Time Calculation (min): 47 min  OT Assessment / Plan / Recommendation Clinical Impression  Pt is an 79 yr old male admitted after fall resulting in right femur fracture.  Pt underwent ORIF via Dr. Magnus West. Now presents with decreased independence with mobility and selfcare tasks.  Will benefit from acute care OT to begin rehab process and help pt increase ADL independence.  Feel pt will benefit greatly from SNF level rehab prior to home with friends.  If pt refuses SNF recommend HHOT and 24 hour physical assist.    OT Assessment  Patient needs continued OT Services    Follow Up Recommendations  SNF    Barriers to Discharge Decreased caregiver support    Equipment Recommendations  3 in 1 bedside comode       Frequency  Min 2X/week    Precautions / Restrictions Precautions Precautions: Fall Restrictions Weight Bearing Restrictions: No Other Position/Activity Restrictions: WBAT   Pertinent Vitals/Pain O2 sats 98% on room air, BP 158/75 in sitting Pain 4/10 with standing in the right hip    ADL  Eating/Feeding: Performed;Independent Where Assessed - Eating/Feeding: Chair Grooming: Simulated;Set up Where Assessed - Grooming: Supported sitting Upper Body Bathing: Simulated;Set up Where Assessed - Upper Body Bathing: Unsupported sitting Lower Body Bathing: Simulated;Moderate assistance Where Assessed - Lower Body Bathing: Supported sit to stand Upper Body Dressing: Simulated;Set up Where Assessed - Upper Body Dressing: Unsupported sitting Lower Body Dressing: Simulated;Maximal assistance Where Assessed - Lower Body Dressing: Supported sit to stand Toilet Transfer: Simulated;Moderate assistance Toilet Transfer Method: Stand pivot Toilet Transfer Equipment: Other (comment) (To bedside chair) Toileting - Clothing Manipulation and  Hygiene: Simulated;Moderate assistance Where Assessed - Toileting Clothing Manipulation and Hygiene: Other (comment) (sit to stand from bedside chair) Tub/Shower Transfer Method: Not assessed Equipment Used: Standard walker Transfers/Ambulation Related to ADLs: Pt needs mod assist for stand pivot from EOB to bedside chair with use of SW. ADL Comments: Pt with increased pain in the right hip.  Demonstrates decreased flexibility to reach either foot for dressing tasks.  Will benefit from AE education.      OT Diagnosis: Generalized weakness;Acute pain  OT Problem List: Decreased strength;Decreased activity tolerance;Impaired balance (sitting and/or standing);Decreased knowledge of use of DME or AE;Pain OT Treatment Interventions: Self-care/ADL training;Therapeutic activities;Patient/family education;DME and/or AE instruction;Balance training   OT Goals Acute Rehab OT Goals OT Goal Formulation: With patient Time For Goal Achievement: 11/27/12 Potential to Achieve Goals: Good ADL Goals Pt Will Perform Grooming: with min assist;Standing at sink;Other (comment) (2 grooming tasks) ADL Goal: Grooming - Progress: Goal set today Pt Will Perform Lower Body Bathing: with min assist;Sit to stand from bed;with adaptive equipment ADL Goal: Lower Body Bathing - Progress: Goal set today Pt Will Perform Lower Body Dressing: with min assist;Sit to stand from bed ADL Goal: Lower Body Dressing - Progress: Goal set today Pt Will Transfer to Toilet: with min assist;with DME;3-in-1 ADL Goal: Toilet Transfer - Progress: Goal set today Miscellaneous OT Goals Miscellaneous OT Goal #1: Pt will transfer with min assist to EOB in preparation for selfcare tasks. OT Goal: Miscellaneous Goal #1 - Progress: Goal set today  Visit Information  Last OT Received On: 11/13/12 Assistance Needed: +1    Subjective Data  Subjective: It sure hurts to put weight on it. Patient Stated Goal: To go home and not go to a nursing  home if possible.  Prior Functioning     Home Living Lives With: Family Available Help at Discharge: Available 24 hours/day;Friend(s) (Has caretaker) Type of Home: House Home Layout: One level Bathroom Shower/Tub: Engineer, manufacturing systems: Standard Home Adaptive Equipment: Shower chair with back Prior Function Level of Independence: Independent Able to Take Stairs?: Yes Vocation: Retired Musician: No difficulties Dominant Hand: Right         Vision/Perception Vision - History Baseline Vision: No visual deficits Patient Visual Report: No change from baseline Vision - Assessment Vision Assessment: Vision not tested Perception Perception: Within Functional Limits Praxis Praxis: Intact   Cognition  Cognition Overall Cognitive Status: Appears within functional limits for tasks assessed/performed Arousal/Alertness: Awake/alert Orientation Level: Appears intact for tasks assessed Behavior During Session: [name] for tasks performed    Extremity/Trunk Assessment Right Upper Extremity Assessment RUE ROM/Strength/Tone: WFL for tasks assessed RUE Sensation: WFL - Light Touch;WFL - Proprioception RUE Coordination: WFL - gross/fine motor Left Upper Extremity Assessment LUE ROM/Strength/Tone: WFL for tasks assessed LUE Sensation: WFL - Light Touch;WFL - Proprioception LUE Coordination: WFL - gross/fine motor Trunk Assessment Trunk Assessment: Kyphotic     Mobility Bed Mobility Bed Mobility: Supine to Sit Supine to Sit: 1: +1 Total assist;HOB flat Sitting - Scoot to Edge of Bed: 1: +1 Total assist Transfers Transfers: Sit to Stand Sit to Stand: 3: Mod assist;With upper extremity assist;From bed Stand to Sit: 3: Mod assist;To chair/3-in-1        Balance Balance Balance Assessed: Yes Static Standing Balance Static Standing - Balance Support: Right upper extremity supported;Left upper extremity supported Static Standing - Level of  Assistance: 2: Max assist   End of Session OT - End of Session Activity Tolerance: Patient limited by pain Patient left: in chair;with call bell/phone within reach;with bed alarm set Nurse Communication: Mobility status     Larry West OTR/L Pager number [phone] 11/13/2012, 12:24 PM

## 2012-11-13 NOTE — Progress Notes (Signed)
Clinical Social Work Department CLINICAL SOCIAL WORK PLACEMENT NOTE 11/13/2012  Patient:  Larry West, Larry West  Account Number:  [id number] Admit date:  11/11/2012  Clinical Social Worker:  Doroteo Glassman  Date/time:  11/13/2012 01:05 PM  Clinical Social Work is seeking post-discharge placement for this patient at the following level of care:   SKILLED NURSING   (*CSW will update this form in Epic as items are completed)   11/13/2012  Patient/family provided with [facility] Department of Clinical Social Work's list of facilities offering this level of care within the geographic area requested by the patient (or if unable, by the patient's family).  11/13/2012  Patient/family informed of their freedom to choose among providers that offer the needed level of care, that participate in Medicare, Medicaid or managed care program needed by the patient, have an available bed and are willing to accept the patient.  11/13/2012  Patient/family informed of [facility]' ownership interest in [facility], as well as of the fact that they are under no obligation to receive care at this facility.  PASARR submitted to EDS on 11/13/2012 PASARR number received from [employer] on 11/13/2012  FL2 transmitted to all facilities in geographic area requested by pt/family on  11/13/2012 FL2 transmitted to all facilities within larger geographic area on   Patient informed that his/her managed care company has contracts with or will negotiate with  certain facilities, including the following:     Patient/family informed of bed offers received:   Patient chooses bed at  Physician recommends and patient chooses bed at    Patient to be transferred to  on   Patient to be transferred to facility by   The following physician request were entered in Epic:   Additional Comments:  CSW to continue to follow.  Providence Crosby, LCSWA Clinical Social Work [phone]

## 2012-11-13 NOTE — Progress Notes (Signed)
Pt refused norvasc, states it makes him itch, bp met prn lopressor orders, so gave that instead, md rounding and aware

## 2012-11-13 NOTE — Progress Notes (Signed)
Per patient: Do not give information to or speak to his daughter Angela Burke) she calls from [place] and wants to take charge of his care insisting that he is confused. However, the patient is alert and oriented and can make his own decisions. Erskin Burnet RN

## 2012-11-13 NOTE — Progress Notes (Signed)
Subjective: 2 Days Post-Op Procedure(s) (LRB): INTRAMEDULLARY (IM) NAIL FEMORAL (Right) Patient reports pain as moderate.  Slow mobility with therapy.  Transfusion yesterday.  Objective: Vital signs in last 24 hours: Temp:  [97.8 F (36.6 C)-98.3 F (36.8 C)] 98 F (36.7 C) (03/15 0431) Pulse Rate:  [74-78] 74 (03/15 0431) Resp:  [14-18] 16 (03/15 1154) BP: (149-175)/(69-81) 158/75 mmHg (03/15 1207) SpO2:  [95 %-100 %] 98 % (03/15 1207)  Intake/Output from previous day: 03/14 0701 - 03/15 0700 In: 1798.5 [P.O.:240; I.V.:510.8; Blood:1047.7] Out: 850 [Urine:850] Intake/Output this shift: Total I/O In: 280 [P.O.:240; I.V.:40] Out: -    Recent Labs  11/11/12 0850 11/12/12 0504 11/13/12 0437  HGB 9.9* 7.4* 8.9*    Recent Labs  11/12/12 0504 11/13/12 0437  WBC 5.2 5.4  RBC 2.11* 2.69*  HCT 22.8* 25.7*  PLT 183 155    Recent Labs  11/12/12 0504 11/13/12 0437  NA 135 134*  K 5.5* 3.8  CL 107 104  CO2 20 19  BUN 57* 49*  CREATININE 2.37* 2.09*  GLUCOSE 125* 115*  CALCIUM 8.2* 8.1*    Recent Labs  11/11/12 0850  INR 0.95    Intact pulses distally Dorsiflexion/Plantar flexion intact Incision: scant drainage No cellulitis present  Assessment/Plan: 2 Days Post-Op Procedure(s) (LRB): INTRAMEDULLARY (IM) NAIL FEMORAL (Right) Up with therapy Discharge to SNF likely early next week.  Larry West Y 11/13/2012, 2:19 PM

## 2012-11-14 LAB — CBC
HCT: 23.9 % — ABNORMAL LOW (ref 39.0–52.0)
Hemoglobin: 8.1 g/dL — ABNORMAL LOW (ref 13.0–17.0)
MCH: 32.5 pg (ref 26.0–34.0)
MCHC: 33.9 g/dL (ref 30.0–36.0)
MCV: 96 fL (ref 78.0–100.0)
RBC: 2.49 MIL/uL — ABNORMAL LOW (ref 4.22–5.81)

## 2012-11-14 MED ORDER — POLYETHYLENE GLYCOL 3350 17 G PO PACK
17.0000 g | PACK | Freq: Every day | ORAL | Status: DC
  Administered 2012-11-15 – 2012-11-16 (×2): 17 g via ORAL
  Filled 2012-11-14 (×3): qty 1

## 2012-11-14 NOTE — Progress Notes (Signed)
[facility] PROGRESS NOTE  Larry West UXL:[id number] DOB: 1925-12-21 DOA: 11/11/2012 PCP: Lynnea Ferrier [name], MD  Assessment/Plan:  R hip fracture s/p ORIF: 3/13 per Dr.Blackman - Pt/OT -wound care -DVT prophylaxis with lovenox  Acute post op blood loss anemia: - s/p 2 units PRBC3/14, monitor volume status closely  HTN (hypertension)  -Continue Norvasc. Lopressor when necessary ordered.   Seizures  -Continue Dilantin.  Chronic kidney disease (CKD), stage III (moderate)  -Creatinine appears to be at usual baseline values.   Anemia of chronic disease  -received a dose of aranesp yesterday   Fall  -PT and OT evaluations postoperatively.   Hyperkalemia :  - corrected with kayexalate        Code Status: Full Family Communication: none at bedside Disposition Plan: to be determined   Consultants:  ORtho Dr.Blackman  Procedures: PROCEDURE: Open reduction and internal fixation of right  intertrochanteric hip fracture.on 3/13 per Dr.Blackman   HPI/Subjective: Feels ok, some R hip pain and chronic back pain  Objective: Filed Vitals:   11/14/12 0400 11/14/12 0613 11/14/12 0617 11/14/12 0835  BP:  182/67 161/79 110/60  Pulse:  77  97  Temp:  97.9 F (36.6 C)  97.5 F (36.4 C)  TempSrc:  Oral  Oral  Resp: 18 18  19   Height:      Weight:      SpO2: 98% 97%  99%    Intake/Output Summary (Last 24 hours) at 11/14/12 1336 Last data filed at 11/14/12 1235  Gross per 24 hour  Intake    566 ml  Output    900 ml  Net   -334 ml   Filed Weights   11/11/12 1636 11/11/12 1811  Weight: 68.04 kg (150 lb) 66.679 kg (147 lb)    Exam:   General:  AAOx3  Cardiovascular: S1S2/RRR  Respiratory: CTAB  Abdomen: soft, NT, BS present  Musculoskeletal: R hip incision c/d/i  Data Reviewed: Basic Metabolic Panel:  Recent Labs Lab 11/11/12 0850 11/11/12 1100 11/12/12 0504 11/13/12 0437  NA 134* 134* 135 134*  K 5.8* 5.7* 5.5* 3.8  CL 104 105 107  104  CO2 19 17* 20 19  GLUCOSE 102* 103* 125* 115*  BUN 66* 66* 57* 49*  CREATININE 2.32* 2.29* 2.37* 2.09*  CALCIUM 8.6 8.2* 8.2* 8.1*   Liver Function Tests:  Recent Labs Lab 11/11/12 1115  AST 32  ALT 19  ALKPHOS 119*  BILITOT 0.1*  PROT 6.5  ALBUMIN 2.7*   No results found for this basename: LIPASE, AMYLASE,  in the last 168 hours No results found for this basename: AMMONIA,  in the last 168 hours CBC:  Recent Labs Lab 11/11/12 0850 11/12/12 0504 11/13/12 0437 11/14/12 0454  WBC 8.9 5.2 5.4 5.5  NEUTROABS 7.1  --   --   --   HGB 9.9* 7.4* 8.9* 8.1*  HCT 29.7* 22.8* 25.7* 23.9*  MCV 108.0* 108.1* 95.5 96.0  PLT 230 183 155 156   Cardiac Enzymes: No results found for this basename: CKTOTAL, CKMB, CKMBINDEX, TROPONINI,  in the last 168 hours BNP (last 3 results) No results found for this basename: PROBNP,  in the last 8760 hours CBG: No results found for this basename: GLUCAP,  in the last 168 hours  Recent Results (from the past 240 hour(s))  SURGICAL PCR SCREEN     Status: None   Collection Time    11/11/12  4:30 PM      Result Value Range Status  MRSA, PCR NEGATIVE  NEGATIVE Final   Staphylococcus aureus NEGATIVE  NEGATIVE Final   Comment:            The Xpert SA Assay (FDA     approved for NASAL specimens     in patients over 35 years of age),     is one component of     a comprehensive surveillance     program.  Test performance has     been validated by [employer] for patients greater     than or equal to 68 year old.     It is not intended     to diagnose infection nor to     guide or monitor treatment.     Studies: No results found.  Scheduled Meds: . amLODipine  10 mg Oral q morning - 10a  . aspirin EC  325 mg Oral Q breakfast  . atorvastatin  40 mg Oral QHS  . calcitRIOL  0.25 mcg Oral q morning - 10a  . [START ON 11/15/2012] darbepoetin  300 mcg Subcutaneous Q28 days  . docusate sodium  100 mg Oral BID  . enoxaparin (LOVENOX)  injection  30 mg Subcutaneous Q24H  . ferrous sulfate  325 mg Oral TID PC  . phenytoin  200 mg Oral QHS  . polyethylene glycol  17 g Oral Daily  . sodium chloride  3 mL Intravenous Q12H   Continuous Infusions: . sodium chloride 20 mL/hr at 11/13/12 2121    Principal Problem:   Closed right hip fracture Active Problems:   HTN (hypertension)   Seizures   Chronic kidney disease (CKD), stage III (moderate)   Anemia of chronic disease   Fall   Hyperkalemia    Time spent:    [facility]  [facility] Pager [phone]. If 7PM-7AM, please contact night-coverage at [email or web address], password [facility] 11/14/2012, 1:36 PM  LOS: 3 days

## 2012-11-15 DIAGNOSIS — D638 Anemia in other chronic diseases classified elsewhere: Secondary | ICD-10-CM

## 2012-11-15 LAB — COMPREHENSIVE METABOLIC PANEL
Alkaline Phosphatase: 104 U/L (ref 39–117)
BUN: 44 mg/dL — ABNORMAL HIGH (ref 6–23)
Creatinine, Ser: 2.18 mg/dL — ABNORMAL HIGH (ref 0.50–1.35)
GFR calc Af Amer: 30 mL/min — ABNORMAL LOW (ref >90)
Glucose, Bld: 102 mg/dL — ABNORMAL HIGH (ref 70–99)
Potassium: 4.2 mEq/L (ref 3.5–5.1)
Total Protein: 5.4 g/dL — ABNORMAL LOW (ref 6.0–8.3)

## 2012-11-15 LAB — CBC
HCT: 23.9 % — ABNORMAL LOW (ref 39.0–52.0)
MCH: 32.5 pg (ref 26.0–34.0)
MCHC: 33.5 g/dL (ref 30.0–36.0)
MCV: 97.2 fL (ref 78.0–100.0)
Platelets: 156 10*3/uL (ref 150–400)
RDW: 21.4 % — ABNORMAL HIGH (ref 11.5–15.5)

## 2012-11-15 MED ORDER — POLYETHYLENE GLYCOL 3350 17 G PO PACK: 17.0000 g | PACK | Freq: Every day | ORAL | Status: DC

## 2012-11-15 MED ORDER — ENOXAPARIN SODIUM 30 MG/0.3ML ~~LOC~~ SOLN: 30.0000 mg | SUBCUTANEOUS | Status: DC

## 2012-11-15 MED ORDER — HYDROCODONE-ACETAMINOPHEN 5-325 MG PO TABS: 1.0000 | ORAL_TABLET | ORAL | Status: DC | PRN

## 2012-11-15 MED ORDER — PHENYTOIN SODIUM EXTENDED 100 MG PO CAPS: 200.0000 mg | ORAL_CAPSULE | Freq: Every day | ORAL | Status: DC

## 2012-11-15 MED ORDER — HYDRALAZINE HCL 10 MG PO TABS
10.0000 mg | ORAL_TABLET | Freq: Three times a day (TID) | ORAL | Status: DC
  Administered 2012-11-15 – 2012-11-16 (×4): 10 mg via ORAL
  Filled 2012-11-15 (×6): qty 1

## 2012-11-15 MED ORDER — MECLIZINE HCL 12.5 MG PO TABS: 12.5000 mg | ORAL_TABLET | Freq: Two times a day (BID) | ORAL | Status: DC

## 2012-11-15 MED ORDER — DSS 100 MG PO CAPS: 100.0000 mg | ORAL_CAPSULE | Freq: Two times a day (BID) | ORAL | Status: DC | PRN

## 2012-11-15 NOTE — Progress Notes (Signed)
Subjective: 4 Days Post-Op Procedure(s) (LRB): INTRAMEDULLARY (IM) NAIL FEMORAL (Right) Patient reports pain as severe.   Slow progress with PT reports pain in hip and nausea when up with OT. Objective: Vital signs in last 24 hours: Temp:  [97.8 F (36.6 C)-98.5 F (36.9 C)] 98.5 F (36.9 C) (03/17 0610) Pulse Rate:  [78-98] 78 (03/17 0610) Resp:  [18-20] 18 (03/17 0610) BP: (95-168)/(52-76) 168/76 mmHg (03/17 0610) SpO2:  [97 %-99 %] 97 % (03/17 0610)  Intake/Output from previous day: 03/16 0701 - 03/17 0700 In: 804.7 [P.O.:580; I.V.:224.7] Out: 775 [Urine:775] Intake/Output this shift: Total I/O In: 120 [P.O.:120] Out: 300 [Urine:300]   Recent Labs  11/13/12 0437 11/14/12 0454 11/15/12 0610  HGB 8.9* 8.1* 8.0*    Recent Labs  11/14/12 0454 11/15/12 0610  WBC 5.5 5.3  RBC 2.49* 2.46*  HCT 23.9* 23.9*  PLT 156 156    Recent Labs  11/13/12 0437 11/15/12 0610  NA 134* 136  K 3.8 4.2  CL 104 106  CO2 19 19  BUN 49* 44*  CREATININE 2.09* 2.18*  GLUCOSE 115* 102*  CALCIUM 8.1* 8.0*   No results found for this basename: LABPT, INR,  in the last 72 hours  Right lower Extremity: Neurovascular intact Dorsiflexion/Plantar flexion intact Incision: dressing C/D/I Compartment soft  Assessment/Plan: 4 Days Post-Op Procedure(s) (LRB): INTRAMEDULLARY (IM) NAIL FEMORAL (Right) OOB with PT/Ot this afternoon Will need daily dressing changes starting Tuesday to right hip Apply clean dry bandage Follow-up with Dr. Magnus Ivan 2 weeks post OP WBAT right leg Dispo- SNF when medically ready  Larry West 11/15/2012, 12:35 PM

## 2012-11-15 NOTE — Progress Notes (Signed)
MEDICATION RELATED CONSULT NOTE - INITIAL   Pharmacy Consult for Phenytoin Indication: Hx seizures  Allergies  Allergen Reactions  . Darvocet (Propoxyphene-Acetaminophen) Nausea Only  . Penicillins Swelling    Unknown    . Sulfonamide Derivatives Itching   Patient Measurements: Height: 5' 7.5" (171.5 cm) Weight: 147 lb (66.679 kg) IBW/kg (Calculated) : 67.25  Vital Signs: Temp: 98.5 F (36.9 C) (03/17 0610) Temp src: Oral (03/17 0610) BP: 168/76 mmHg (03/17 0610) Pulse Rate: 78 (03/17 0610) Intake/Output from previous day: 03/16 0701 - 03/17 0700 In: 804.7 [P.O.:580; I.V.:224.7] Out: 775 [Urine:775] Intake/Output from this shift: Total I/O In: -  Out: 300 [Urine:300]  Labs:  Recent Labs  11/13/12 0437 11/14/12 0454 11/15/12 0610  WBC 5.4 5.5 5.3  HGB 8.9* 8.1* 8.0*  HCT 25.7* 23.9* 23.9*  PLT 155 156 156  CREATININE 2.09*  --  2.18*  ALBUMIN  --   --  1.9*  PROT  --   --  5.4*  AST  --   --  63*  ALT  --   --  37  ALKPHOS  --   --  104  BILITOT  --   --  0.3   Estimated Creatinine Clearance: 23.8 ml/min (by C-G formula based on Cr of 2.18).   Microbiology: Recent Results (from the past 720 hour(s))  SURGICAL PCR SCREEN     Status: None   Collection Time    11/11/12  4:30 PM      Result Value Range Status   MRSA, PCR NEGATIVE  NEGATIVE Final   Staphylococcus aureus NEGATIVE  NEGATIVE Final   Comment:            The Xpert SA Assay (FDA     approved for NASAL specimens     in patients over 77 years of age),     is one component of     a comprehensive surveillance     program.  Test performance has     been validated by [employer] for patients greater     than or equal to 77 year old.     It is not intended     to diagnose infection nor to     guide or monitor treatment.    Medical History: Past Medical History  Diagnosis Date  . Hypertension   . Chronic low back pain     right leg pain   . Hip pain     left  . Seizures 2006  .  ETOHism   . Cataract   . CAD (coronary artery disease) 2001    s/p PTCI  . PVD (posterior vitreous detachment), left eye   . Anemia     CKD  . Colon polyps   . Chronic kidney disease (CKD), stage III (moderate)   . Hyperlipidemia   . Hyperkalemia   . Bone pain 09/16/2012  . Vitamin B12 deficiency anemia 10/21/2012   Medications:  Scheduled:  . amLODipine  10 mg Oral q morning - 10a  . aspirin EC  325 mg Oral Q breakfast  . atorvastatin  40 mg Oral QHS  . calcitRIOL  0.25 mcg Oral q morning - 10a  . darbepoetin  300 mcg Subcutaneous Q28 days  . docusate sodium  100 mg Oral BID  . enoxaparin (LOVENOX) injection  30 mg Subcutaneous Q24H  . ferrous sulfate  325 mg Oral TID PC  . hydrALAZINE  10 mg Oral Q8H  . phenytoin  200 mg Oral QHS  . polyethylene glycol  17 g Oral Daily  . sodium chloride  3 mL Intravenous Q12H    Assessment: 79 yoM admitted for hip fx after fall at home. Hx of seizures; on Phenytoin 200mg  po bid.  Admission SCr elevated (previous levels > 2 for past year).   3/13 Phenytoin level 14.5 corrected to ~ 23 with albumin 2.7 and Cl ~ 23 ml/min on phenytoin 200mg  po BID  3/17 Phenytoin level = 6.2 corrects to 12.9 for albumin 1.9 and CrCl ~23 on phenytoin 200 po daily  No obvious drug interactions  Goal of Therapy:  Phenytoin level 10-20 mcg/ml  Plan:  Continue Phenytoin at 200mg  qhs only Monitor renal function Consider repeat level in 2-3 days (this would be a steady-state level)  Juliette Alcide, PharmD, BCPS.   Pager: [phone] 11/15/2012, 10:25 AM

## 2012-11-15 NOTE — Discharge Summary (Addendum)
Physician Discharge Summary  [name] ZOX:[id number] DOB: 04/16/1926 DOA: 11/11/2012  PCP: Leo Grosser, MD  Admit date: 11/11/2012 Discharge date: 11/15/2012  Time spent: 50 minutes  Recommendations for Outpatient Follow-up:  1. Stop lovenox after 3 weeks 2. FU with PCP in 1 week 3. FU with Dr.Blackman in 2 weeks  Recommendations per ORtho  Will need daily dressing changes starting Tuesday to right hip  Apply clean dry bandage  Follow-up with Dr. Magnus Ivan 2 weeks post OP  Activity: WBAT right leg    Discharge Diagnoses:    Closed right hip fracture s/p ORIF   HTN (hypertension)   H/o Seizures   Chronic kidney disease (CKD), stage III (moderate)   CAD   Anemia of chronic disease   Hyperkalemia: resolved   Post operative anemia  Discharge Condition: stable  Diet recommendation: low Na  Filed Weights   11/11/12 1636 11/11/12 1811  Weight: 68.04 kg (150 lb) 66.679 kg (147 lb)    History of present illness: Larry West is an 79 y.o. male with a PMH of ETOHism, HTN, CAD, CKD who suffered a mechanical fall onto his left side. He was unable to stand after the fall. His family then called EMS who brought him to the ER and radiographs were done which confirmed a left hip fracture. The patient has a history of vertigo, but denies dizziness or syncope prior to his fall. He says he tripped over a towel. He denies any recent chest pain. He has a h/o of stent placement, had been on Plavix until 12/13, but this was stopped secondary to a fall resulting in possible liver laceration. No change in his activity tolerance or shortness of breath with activity.   Procedure: Dr.Blackman 3/14 PROCEDURE: Open reduction and internal fixation of right  intertrochanteric hip fracture.   Hospital course:   1. R hip fracture s/p ORIF: 3/13 per Dr.Blackman  - did not have post op complications except acute blood loss anemia - PT/OT started in hospital -wound care  -DVT prophylaxis  with lovenox, continue this for 3 more weeks  Recommendations per ORtho are as follows:  Will need daily dressing changes starting Tuesday to right hip  Apply clean dry bandage  Follow-up with Dr. Magnus Ivan 2 weeks post OP  Activity: WBAT right leg  2. Acute post op blood loss anemia:  - s/p 2 units PRBC on 3/14, -hemoglobin stable since, volume status good/even   3. HTN (hypertension)  -Continue Norvasc, was briefly treated with PRN Lopressor post op  4. Seizures  -Continue Dilantin, dose was decreased to Daily due to elevated level   5. Chronic kidney disease (CKD), stage III (moderate)  -Creatinine appears to be at usual baseline values, continue calcitriol and aranesp  6. Anemia of chronic disease  -received a dose of aranesp prior to admission, continue Qmonthly   7. Hyperkalemia : on admission, corrected with kayexalate  -potassium level normal since      Consultations:  Orthopedics Dr.Blackman  Discharge Exam: Filed Vitals:   11/14/12 0835 11/14/12 1441 11/14/12 2118 11/15/12 0610  BP: 110/60 95/52 155/70 168/76  Pulse: 97 98 85 78  Temp: 97.5 F (36.4 C) 98.1 F (36.7 C) 97.8 F (36.6 C) 98.5 F (36.9 C)  TempSrc: Oral Oral Oral Oral  Resp: 19 20 20 18   Height:      Weight:      SpO2: 99% 98% 99% 97%    General: AAOx3 Cardiovascular: S1S2/RRR Respiratory: CTAB  Discharge Instructions  Discharge Orders   Future Appointments Provider Department Dept Phone   12/09/2012 8:15 AM Dava Najjar [facility] [phone]   12/09/2012 8:45 AM [facility] [place] [facility] [phone]   01/06/2013 8:30 AM Dava Najjar [place]elle [facility] [phone]   01/06/2013 8:45 AM Myrtis Ser, NP [facility] [phone]   01/06/2013 9:15 AM [facility] In[facility] Nurse [place] [facility] [phone]   02/03/2013 8:15 AM Dava Najjar  Idelle Jo [facility] [phone]   02/03/2013 8:45 AM [facility] In[facility] Nurse [place] [facility] [phone]   03/03/2013 8:15 AM Krista Blue [facility] [phone]   03/03/2013 8:45 AM [facility] Inj Nurse [place] [facility] [phone]   03/31/2013 8:15 AM Krista Blue [facility] [phone]   03/31/2013 8:45 AM [facility] Inj Nurse [place] [facility] [phone]   04/28/2013 8:15 AM Krista Blue [facility] MED[facility] ONCOL[facility] [phone]   04/28/2013 8:45 AM [facility] Inj Nurse [place] [facility] [phone]   05/26/2013 8:45 AM [facility] Inj Nurse [place] [facility] [phone]   Future Orders Complete By Expires     Diet - low sodium heart healthy  As directed     Full weight bearing  As directed     Scheduling Instructions:      Can pull full weight as tolerated on right hip        Medication List    TAKE these medications       amLODipine 10 MG tablet  Commonly known as:  NORVASC  Take 10 mg by mouth every morning.     aspirin EC 325 MG tablet  Take 1 tablet (325 mg total) by mouth daily.     atorvastatin 40 MG tablet  Commonly known as:  LIPITOR  Take 40 mg by mouth at bedtime.     calcitRIOL 0.25 MCG capsule  Commonly known as:  ROCALTROL  Take 0.25 mcg by mouth every morning.     darbepoetin 200 MCG/0.4ML Soln  Commonly known as:  ARANESP  Inject 200 mcg into the skin every 28 (twenty-eight) days. Due 11/11/12     DSS 100 MG Caps  Take 100 mg by mouth 2 (two) times daily as needed for constipation.     enoxaparin 30 MG/0.3ML injection  Commonly known as:  LOVENOX  Inject 0.3 mLs (30 mg total) into the skin daily. For 3 weeks     HYDROcodone-acetaminophen 5-325 MG per tablet  Commonly known as:  NORCO/VICODIN  Take 1  tablet by mouth every 6 (six) hours as needed for pain.     HYDROcodone-acetaminophen 5-325 MG per tablet  Commonly known as:  NORCO/VICODIN  Take 1-2 tablets by mouth every 4 (four) hours as needed.     meclizine 12.5 MG tablet  Commonly known as:  ANTIVERT  Take 1 tablet (12.5 mg total) by mouth 2 (two) times daily.     phenytoin 100 MG ER capsule  Commonly known as:  DILANTIN  Take 2 capsules (200 mg total) by mouth daily.     polyethylene glycol packet  Commonly known as:  MIRALAX / GLYCOLAX  Take 17 g by mouth daily.     traMADol 50 MG tablet  Commonly known as:  ULTRAM  Take 1 tablet (  50 mg total) by mouth every 6 (six) hours as needed for pain.           Follow-up Information   Follow up with Kathryne Hitch, MD In 2 weeks.   Contact information:   [address] [place] Number [place] [address] [phone]        The results of significant diagnostics from this hospitalization (including imaging, microbiology, ancillary and laboratory) are listed below for reference.    Significant Diagnostic Studies: Dg Chest 1 View  11/11/2012  *RADIOLOGY REPORT*  Clinical Data: Right hip fracture.  Coronary artery disease and hypertension. Pre-op respiratory exam.  CHEST - 1 VIEW  Comparison: 08/23/2012  Findings: Low lung volumes are seen however both lungs are clear. No evidence of pleural effusion.  Heart size is stable.  No mass or lymphadenopathy identified.  IMPRESSION: Low lung volumes.  No active disease.   Original Report Authenticated By: Myles Rosenthal, M.D.    Dg Hip Complete Right  11/11/2012  *RADIOLOGY REPORT*  Clinical Data: Fall.  Severe right hip pain.  RIGHT HIP - COMPLETE 2+ VIEW  Comparison: None.  Findings: Intertrochanteric right hip fracture is seen with mild varus angulation.  No evidence of dislocation.  No evidence of acetabular pelvic fracture.  Peripheral vascular calcification noted.  IMPRESSION: Intertrochanteric right hip fracture.   Original Report  Authenticated By: Myles Rosenthal, M.D.    Dg Femur Right  11/12/2012  *RADIOLOGY REPORT*  Clinical Data: Femur fracture  DG C-ARM 1-60 MIN - NRPT [facility],RIGHT FEMUR - 2 VIEW  Comparison: 700 hours  Findings: A dynamic compression screw and intramedullary rod transfixes an intertrochanteric femur fracture.  No breakage or loosening of the hardware.  Anatomic alignment.  IMPRESSION: ORIF right intertrochanteric femur fracture.   Original Report Authenticated By: Jolaine Click, M.D.    Ct Head Wo Contrast  11/11/2012  *RADIOLOGY REPORT*  Clinical Data:  Fall with hip pain.  CT HEAD WITHOUT CONTRAST CT CERVICAL SPINE WITHOUT CONTRAST  Technique:  Multidetector CT imaging of the head and cervical spine was performed following the standard protocol without intravenous contrast.  Multiplanar CT image reconstructions of the cervical spine were also generated.  Comparison:  08/20/2012.  CT HEAD  Findings: No evidence of acute infarct, acute hemorrhage, mass lesion, mass effect or hydrocephalus.  Atrophy.  Periventricular low attenuation.  Scattered mucosal thickening in the visualized paranasal sinuses. Mastoid air cells are clear.  IMPRESSION:  1.  No acute intracranial abnormality. 2.  Atrophy and chronic microvascular white matter ischemic changes.  CT CERVICAL SPINE  Findings: 4 mm anterolisthesis of C7 on T1, most likely degenerative in etiology.  Alignment is otherwise anatomic. Multilevel uncovertebral and facet hypertrophy with endplate degenerative changes and loss of disc space height, from C3-4 to C7- T1.  Findings are worst at C4-5 and C6-7.  And C3-4, moderate bilateral neural foraminal narrowing.  At C4-5, moderate right, and mild left neural foraminal narrowing.  At C5-6, mild bilateral neural foraminal narrowing.  At C6-7, moderate bilateral neural foraminal narrowing.  Visualized portions of the lung apices are clear.  Low attenuation lesions in the thyroid measure up to approximately 7 mm on the right.  Soft  tissues are otherwise unremarkable.  IMPRESSION:  1.  No acute fracture or subluxation. 2.  Advanced multilevel spondylosis.   Original Report Authenticated By: Leanna Battles, M.D.    Ct Cervical Spine Wo Contrast  11/11/2012  *RADIOLOGY REPORT*  Clinical Data:  Fall with hip pain.  CT HEAD WITHOUT  CONTRAST CT CERVICAL SPINE WITHOUT CONTRAST  Technique:  Multidetector CT imaging of the head and cervical spine was performed following the standard protocol without intravenous contrast.  Multiplanar CT image reconstructions of the cervical spine were also generated.  Comparison:  08/20/2012.  CT HEAD  Findings: No evidence of acute infarct, acute hemorrhage, mass lesion, mass effect or hydrocephalus.  Atrophy.  Periventricular low attenuation.  Scattered mucosal thickening in the visualized paranasal sinuses. Mastoid air cells are clear.  IMPRESSION:  1.  No acute intracranial abnormality. 2.  Atrophy and chronic microvascular white matter ischemic changes.  CT CERVICAL SPINE  Findings: 4 mm anterolisthesis of C7 on T1, most likely degenerative in etiology.  Alignment is otherwise anatomic. Multilevel uncovertebral and facet hypertrophy with endplate degenerative changes and loss of disc space height, from C3-4 to C7- T1.  Findings are worst at C4-5 and C6-7.  And C3-4, moderate bilateral neural foraminal narrowing.  At C4-5, moderate right, and mild left neural foraminal narrowing.  At C5-6, mild bilateral neural foraminal narrowing.  At C6-7, moderate bilateral neural foraminal narrowing.  Visualized portions of the lung apices are clear.  Low attenuation lesions in the thyroid measure up to approximately 7 mm on the right.  Soft tissues are otherwise unremarkable.  IMPRESSION:  1.  No acute fracture or subluxation. 2.  Advanced multilevel spondylosis.   Original Report Authenticated By: Leanna Battles, M.D.    Dg C-arm 1-60 Min-no Report  11/11/2012  CLINICAL DATA: surgery   C-ARM 1-60 MINUTES  Fluoroscopy  was utilized by the requesting physician.  No radiographic  interpretation.      Microbiology: Recent Results (from the past 240 hour(s))  SURGICAL PCR SCREEN     Status: None   Collection Time    11/11/12  4:30 PM      Result Value Range Status   MRSA, PCR NEGATIVE  NEGATIVE Final   Staphylococcus aureus NEGATIVE  NEGATIVE Final   Comment:            The Xpert SA Assay (FDA     approved for NASAL specimens     in patients over 19 years of age),     is one component of     a comprehensive surveillance     program.  Test performance has     been validated by [employer] for patients greater     than or equal to 44 year old.     It is not intended     to diagnose infection nor to     guide or monitor treatment.     Labs: Basic Metabolic Panel:  Recent Labs Lab 11/11/12 0850 11/11/12 1100 11/12/12 0504 11/13/12 0437 11/15/12 0610  NA 134* 134* 135 134* 136  K 5.8* 5.7* 5.5* 3.8 4.2  CL 104 105 107 104 106  CO2 19 17* 20 19 19   GLUCOSE 102* 103* 125* 115* 102*  BUN 66* 66* 57* 49* 44*  CREATININE 2.32* 2.29* 2.37* 2.09* 2.18*  CALCIUM 8.6 8.2* 8.2* 8.1* 8.0*   Liver Function Tests:  Recent Labs Lab 11/11/12 1115 11/15/12 0610  AST 32 63*  ALT 19 37  ALKPHOS 119* 104  BILITOT 0.1* 0.3  PROT 6.5 5.4*  ALBUMIN 2.7* 1.9*   No results found for this basename: LIPASE, AMYLASE,  in the last 168 hours No results found for this basename: AMMONIA,  in the last 168 hours CBC:  Recent Labs Lab 11/11/12 0850 11/12/12 0504  11/13/12 0437 11/14/12 0454 11/15/12 0610  WBC 8.9 5.2 5.4 5.5 5.3  NEUTROABS 7.1  --   --   --   --   HGB 9.9* 7.4* 8.9* 8.1* 8.0*  HCT 29.7* 22.8* 25.7* 23.9* 23.9*  MCV 108.0* 108.1* 95.5 96.0 97.2  PLT 230 183 155 156 156   Cardiac Enzymes: No results found for this basename: CKTOTAL, CKMB, CKMBINDEX, TROPONINI,  in the last 168 hours BNP: BNP (last 3 results) No results found for this basename: PROBNP,  in the last 8760  hours CBG: No results found for this basename: GLUCAP,  in the last 168 hours     Signed:  Kaidyn Javid  [facility] 11/15/2012, 1:06 PM

## 2012-11-15 NOTE — Progress Notes (Signed)
Physical Therapy Treatment Patient Details Name: Larry West MRN: [id number] DOB: 1925/12/21 Today's Date: 11/15/2012 Time: 4098-1191 PT Time Calculation (min): 29 min  PT Assessment / Plan / Recommendation Comments on Treatment Session  Pt with increased pain today and required increased assist.  RN notified of pain med request.  Pt refused to get OOB to chair but did take side steps towards HOB.     Follow Up Recommendations  SNF;Supervision/Assistance - 24 hour     Does the patient have the potential to tolerate intense rehabilitation     Barriers to Discharge        Equipment Recommendations  Rolling walker with 5" wheels    Recommendations for Other Services    Frequency Min 3X/week   Plan Discharge plan remains appropriate    Precautions / Restrictions Precautions Precautions: Fall Restrictions Weight Bearing Restrictions: No Other Position/Activity Restrictions: WBAT   Pertinent Vitals/Pain 10/10 pain with mobility.     Mobility  Bed Mobility Bed Mobility: Supine to Sit;Sit to Supine Supine to Sit: 1: +2 Total assist Supine to Sit: Patient Percentage: 30% Sit to Supine: 1: +2 Total assist;HOB flat Sit to Supine: Patient Percentage: 20% Details for Bed Mobility Assistance: Pt requires increased assist for BLEs and trunk today due to increased pain.  Max cues for hand placement and self assisting.  Transfers Transfers: Sit to Stand;Stand to Sit Sit to Stand: 1: +2 Total assist;From bed;From elevated surface Sit to Stand: Patient Percentage: 50% Stand to Sit: 1: +2 Total assist;To bed Stand to Sit: Patient Percentage: 40% Details for Transfer Assistance: Performed x 2 in order to stand and take side steps up in bed.  Assist to rise, steady and ensure controlled descent with max cues for hand placement and safety.  Pt required increased assist today due to increase pain.  Ambulation/Gait Ambulation/Gait Assistance: 1: +2 Total assist Ambulation/Gait: Patient  Percentage: 40% Ambulation Distance (Feet): 3 Feet Assistive device: Rolling walker Ambulation/Gait Assistance Details: Max cues for sequencing/technique with RW.  Again.  Pt refused to get to chair today, therefore only took side steps to get to EOB.  Gait Pattern: Step-to pattern;Decreased stride length Stairs: No    Exercises     PT Diagnosis:    PT Problem List:   PT Treatment Interventions:     PT Goals Acute Rehab PT Goals PT Goal Formulation: With patient Time For Goal Achievement: 11/19/12 Potential to Achieve Goals: Good Pt will go Supine/Side to Sit: with min assist PT Goal: Supine/Side to Sit - Progress: Progressing toward goal Pt will go Sit to Supine/Side: with min assist PT Goal: Sit to Supine/Side - Progress: Progressing toward goal Pt will go Sit to Stand: with min assist PT Goal: Sit to Stand - Progress: Progressing toward goal Pt will go Stand to Sit: with min assist PT Goal: Stand to Sit - Progress: Progressing toward goal  Visit Information  Last PT Received On: 11/15/12 Assistance Needed: +2    Subjective Data  Subjective: I'm hurting to bad.  I feel like I'm going to throw up.  Patient Stated Goal: to return home.    Cognition  Cognition Overall Cognitive Status: Appears within functional limits for tasks assessed/performed Arousal/Alertness: Awake/alert Orientation Level: Appears intact for tasks assessed Behavior During Session: [facility] for tasks performed    Balance     End of Session PT - End of Session Equipment Utilized During Treatment: Gait belt Activity Tolerance: Patient limited by pain Patient left: in bed;with call bell/phone within  reach;with nursing in room Nurse Communication: Mobility status;Patient requests pain meds   GP     Vista Deck 11/15/2012, 10:29 AM

## 2012-11-15 NOTE — Progress Notes (Signed)
Patient has a bed @ [facility], awaiting [employer] insurance authorization. Information was submitted earlier today & CSW left message for [facility] case worker, Alcario Drought. Awaiting call back.   Clinical Social Work Department CLINICAL SOCIAL WORK PLACEMENT NOTE 11/15/2012  Patient:  Larry West, Larry West  Account Number:  [id number] Admit date:  11/11/2012  Clinical Social Worker:  Doroteo Glassman  Date/time:  11/13/2012 01:05 PM  Clinical Social Work is seeking post-discharge placement for this patient at the following level of care:   SKILLED NURSING   (*CSW will update this form in Epic as items are completed)   11/13/2012  Patient/family provided with [facility] Department of Clinical Social Work's list of facilities offering this level of care within the geographic area requested by the patient (or if unable, by the patient's family).  11/13/2012  Patient/family informed of their freedom to choose among providers that offer the needed level of care, that participate in Medicare, Medicaid or managed care program needed by the patient, have an available bed and are willing to accept the patient.  11/13/2012  Patient/family informed of [facility]' ownership interest in [facility], as well as of the fact that they are under no obligation to receive care at this facility.  PASARR submitted to EDS on 11/13/2012 PASARR number received from EDS on 11/13/2012  FL2 transmitted to all facilities in geographic area requested by pt/family on  11/13/2012 FL2 transmitted to all facilities within larger geographic area on   Patient informed that his/her managed care company has contracts with or will negotiate with  certain facilities, including the following:   [facility]     Patient/family informed of bed offers received:  11/15/2012 Patient chooses bed at [facility] Physician recommends and patient chooses bed at    Patient to be transferred to [facility] on   Patient to be transferred to facility by   The following physician request were entered in Epic:   Additional Comments:    Unice Bailey, LCSW [facility] Clinical Social Worker cell #: [phone]

## 2012-11-16 ENCOUNTER — Telehealth: Payer: Self-pay | Admitting: Family Medicine

## 2012-11-16 NOTE — Progress Notes (Signed)
Patient is set to discharge to [facility] today. CSW received call from [facility] caseworker, [place] with authorization. Facilty aware. [facility] called for 2:15 pickup to give patient time to eat his lunch & get ready.    Clinical Social Work Department CLINICAL SOCIAL WORK PLACEMENT NOTE 11/16/2012  Patient:  Larry West, Larry West  Account Number:  [id number] Admit date:  11/11/2012  Clinical Social Worker:  Doroteo Glassman  Date/time:  11/13/2012 01:05 PM  Clinical Social Work is seeking post-discharge placement for this patient at the following level of care:   SKILLED NURSING   (*CSW will update this form in Epic as items are completed)   11/13/2012  Patient/family provided with [facility] Department of Clinical Social Work's list of facilities offering this level of care within the geographic area requested by the patient (or if unable, by the patient's family).  11/13/2012  Patient/family informed of their freedom to choose among providers that offer the needed level of care, that participate in Medicare, Medicaid or managed care program needed by the patient, have an available bed and are willing to accept the patient.  11/13/2012  Patient/family informed of [facility]' ownership interest in [facility], as well as of the fact that they are under no obligation to receive care at this facility.  PASARR submitted to EDS on 11/13/2012 PASARR number received from EDS on 11/13/2012  FL2 transmitted to all facilities in geographic area requested by pt/family on  11/13/2012 FL2 transmitted to all facilities within larger geographic area on   Patient informed that his/her managed care company has contracts with or will negotiate with  certain facilities, including the following:   [facility]     Patient/family informed of bed offers received:  11/15/2012 Patient chooses bed at [facility] Physician recommends and patient chooses bed at    Patient to be  transferred to [facility] on  11/16/2012 Patient to be transferred to facility by [facility]  The following physician request were entered in Epic:   Additional Comments:  Unice Bailey, LCSW [facility] Clinical Social Worker cell #: [phone]

## 2012-11-16 NOTE — Telephone Encounter (Signed)
noted 

## 2012-11-16 NOTE — Telephone Encounter (Signed)
Deb, from [facility], called to let you know that Larry West is being admitted today to a skilled nursing facility for a closed fracture of unspecified femur.

## 2012-11-17 ENCOUNTER — Other Ambulatory Visit: Payer: Self-pay | Admitting: *Deleted

## 2012-11-17 DIAGNOSIS — M898X9 Other specified disorders of bone, unspecified site: Secondary | ICD-10-CM

## 2012-11-17 MED ORDER — HYDROCODONE-ACETAMINOPHEN 5-325 MG PO TABS: 1.0000 | ORAL_TABLET | ORAL | Status: DC | PRN

## 2012-11-17 MED ORDER — HYDROCODONE-ACETAMINOPHEN 5-325 MG PO TABS: ORAL_TABLET | ORAL | Status: DC

## 2012-11-18 ENCOUNTER — Non-Acute Institutional Stay (SKILLED_NURSING_FACILITY): Payer: Medicare PPO | Admitting: Adult Health

## 2012-11-18 DIAGNOSIS — I10 Essential (primary) hypertension: Secondary | ICD-10-CM

## 2012-11-18 DIAGNOSIS — S72001S Fracture of unspecified part of neck of right femur, sequela: Secondary | ICD-10-CM

## 2012-11-18 DIAGNOSIS — E785 Hyperlipidemia, unspecified: Secondary | ICD-10-CM

## 2012-11-18 DIAGNOSIS — S72009S Fracture of unspecified part of neck of unspecified femur, sequela: Secondary | ICD-10-CM

## 2012-11-18 DIAGNOSIS — D638 Anemia in other chronic diseases classified elsewhere: Secondary | ICD-10-CM

## 2012-11-18 DIAGNOSIS — R569 Unspecified convulsions: Secondary | ICD-10-CM

## 2012-11-22 ENCOUNTER — Non-Acute Institutional Stay (SKILLED_NURSING_FACILITY): Payer: Medicare HMO | Admitting: Internal Medicine

## 2012-11-22 ENCOUNTER — Other Ambulatory Visit: Payer: Self-pay | Admitting: Geriatric Medicine

## 2012-11-22 DIAGNOSIS — D62 Acute posthemorrhagic anemia: Secondary | ICD-10-CM

## 2012-11-22 DIAGNOSIS — S7290XD Unspecified fracture of unspecified femur, subsequent encounter for closed fracture with routine healing: Secondary | ICD-10-CM

## 2012-11-22 DIAGNOSIS — S7291XD Unspecified fracture of right femur, subsequent encounter for closed fracture with routine healing: Secondary | ICD-10-CM

## 2012-11-22 DIAGNOSIS — R569 Unspecified convulsions: Secondary | ICD-10-CM

## 2012-11-22 MED ORDER — HYDROCODONE-ACETAMINOPHEN 7.5-325 MG PO TABS: ORAL_TABLET | ORAL | Status: DC

## 2012-11-24 ENCOUNTER — Other Ambulatory Visit: Payer: Self-pay | Admitting: Family Medicine

## 2012-11-26 NOTE — Progress Notes (Signed)
Patient ID: Larry West, male   DOB: Nov 11, 1925, 79 y.o.   MRN: [id number]          HISTORY & PHYSICAL  DATE:  03.24.2014  FACILITY: [facility]   LEVEL OF CARE: SNF   ALLERGIES:  Allergies  Allergen Reactions  . Darvocet (Propoxyphene-Acetaminophen) Nausea Only  . Penicillins Swelling    Unknown    . Sulfonamide Derivatives Itching     CHIEF COMPLAINT:  Manage closed right hip fracture, acute blood loss anemia and seizure disorder.    HISTORY OF PRESENT ILLNESS:  79 year-old, Caucasian male suffered a mechanical fall on his right side.  In the ER, radiographs confirmed a closed right hip fracture.  Therefore, he underwent ORIF and tolerated the procedure well.  Patient is admitted to this facility for short-term rehabilitation.  He is complaining of right hip pain.    Acute blood loss anemia.  Postoperatively, patient suffered acute blood loss.  The anemia has been stable. The patient denies fatigue, melena or hematochezia. No complications from the medications currently being used.   Last hemoglobins were 8, 8.1, 8.9, 7.4 and 9.9.  He is receiving Aranesp and tolerates it without any problems.    Seizure disorder.  The patient's seizure disorder remains stable. No complications reported from the medications presently being used. Staff do not report any recent seizure activity.   PAST MEDICAL HISTORY :  Past Medical History  Diagnosis Date  . Hypertension   . Chronic low back pain     right leg pain   . Hip pain     left  . Seizures 2006  . ETOHism   . Cataract   . CAD (coronary artery disease) 2001    s/p PTCI  . PVD (posterior vitreous detachment), left eye   . Anemia     CKD  . Colon polyps   . Chronic kidney disease (CKD), stage III (moderate)   . Hyperlipidemia   . Hyperkalemia   . Bone pain 09/16/2012  . Vitamin B12 deficiency anemia 10/21/2012     PAST SURGICAL HISTORY: Past Surgical History  Procedure Laterality Date  . Cataracts surgery    .  Appendectomy  1955  . Gastric outlet obstruction surgery  1988  . Lumbar disc surgery    . Femur im nail Right 11/11/2012    Procedure: INTRAMEDULLARY (IM) NAIL FEMORAL;  Surgeon: Kathryne Hitch, MD;  Location: [facility];  Service: Orthopedics;  Laterality: Right;     SOCIAL HISTORY:  reports that he has never smoked. He has never used smokeless tobacco. He reports that he drinks about 4.8 ounces of alcohol per week. He reports that he does not use illicit drugs.   FAMILY HISTORY:  Family History  Problem Relation Age of Onset  . Cancer Son 73    colon cancer     CURRENT MEDICATIONS: Reviewed per [facility]  REVIEW OF SYSTEMS:  See HPI otherwise 14 point ROS is negative.   PHYSICAL EXAMINATION  VS:  T  98            P  80           RR  18         BP  140/88            POX%                WT (Lb)  160  GENERAL: no acute distress, normal body habitus SKIN: warm & dry,, no  suspicious lesions or rashes, no excessive dryness EYES: conjunctivae normal, sclerae normal, normal eye lids MOUTH/THROAT: lips without lesions,,no lesions in the mouth,tongue is without lesions,uvula elevates in midline NECK: supple, trachea midline, no neck masses, no thyroid tenderness, no thyromegaly LYMPHATICS: no LAN in the neck, no supraclavicular LAN RESPIRATORY: breathing is even & unlabored, BS CTAB CARDIAC: RRR, no murmur,no extra heart sounds, no edema GI:  ABDOMEN: abdomen soft, normal BS, no masses, no tenderness  LIVER/SPLEEN: no hepatomegaly, no splenomegaly MUSCULOSKELETAL: HEAD: normal to inspection & palpation BACK: no kyphosis, scoliosis or spinal processes tenderness EXTREMITIES: LEFT UPPER EXTREMITY: Strength intact.  Range of motion unable to perform secondary to shoulder problem.  RIGHT UPPER EXTREMITY:  Strength intact.  Range of motion unable to perform secondary to shoulder problem.   LEFT LOWER EXTREMITY:  full range of motion, normal strength & tone RIGHT LOWER EXTREMITY:   Strength intact.  Range of motion not tested due to surgery.   PSYCHIATRIC: the patient is alert & oriented to person, affect & behavior appropriate  LABS/RADIOLOGY:  Hemoglobin 8, MCV 97.2, otherwise CBC normal.    AST 63, total protein 5.4, albumin 1.9, otherwise liver profile normal.    Glucose 102, BUN 44, creatinine 2.18, otherwise BMP normal.    MRSA by PCR negative.    Staph aureus by PCR negative.   Chest x-ray:  No acute disease.    Right hip x-ray:  Intertrochanteric right hip fracture.    Postsurgical right femur x-ray:  ORIF of right intertrochanteric femur fracture.  CT of the head:  Negative for any acute findings.    CT of the cervical spine:  Negative for any acute findings.    ASSESSMENT/PLAN:  Right closed intertrochanteric fracture (            ).  Status post ORIF.  Continue rehabilitation.    Acute blood loss anemia (            ).  Status post transfusion.  Reassess hemoglobin level.  Continue Aranesp.   Seizure disorder (            ).  Well controlled.    Chronic kidney disease stage III (            ).  Reassess renal functions.    Renovascular hypertension (            ).  Blood pressure borderline.  We will monitor.    Coronary artery disease 414.01.  Stable.    Right hip pain (            ).  Uncontrolled problem.  Start Vicodin 7.5/325 q.6 routine.     I have reviewed patient's medical records received at admission/from hospitalization.   CPT CODE: 21308.

## 2012-11-29 ENCOUNTER — Non-Acute Institutional Stay (SKILLED_NURSING_FACILITY): Payer: Medicare HMO | Admitting: Internal Medicine

## 2012-11-29 ENCOUNTER — Other Ambulatory Visit: Payer: Self-pay | Admitting: *Deleted

## 2012-11-29 DIAGNOSIS — M25569 Pain in unspecified knee: Secondary | ICD-10-CM

## 2012-11-29 DIAGNOSIS — R7309 Other abnormal glucose: Secondary | ICD-10-CM

## 2012-11-29 DIAGNOSIS — M25561 Pain in right knee: Secondary | ICD-10-CM

## 2012-11-29 DIAGNOSIS — N189 Chronic kidney disease, unspecified: Secondary | ICD-10-CM

## 2012-11-29 DIAGNOSIS — R739 Hyperglycemia, unspecified: Secondary | ICD-10-CM

## 2012-11-29 DIAGNOSIS — D631 Anemia in chronic kidney disease: Secondary | ICD-10-CM

## 2012-11-29 MED ORDER — HYDROCODONE-ACETAMINOPHEN 7.5-325 MG PO TABS: ORAL_TABLET | ORAL | Status: DC

## 2012-12-03 ENCOUNTER — Telehealth: Payer: Self-pay | Admitting: Family Medicine

## 2012-12-04 ENCOUNTER — Other Ambulatory Visit: Payer: Self-pay | Admitting: Family Medicine

## 2012-12-05 ENCOUNTER — Non-Acute Institutional Stay (SKILLED_NURSING_FACILITY): Payer: Medicare HMO | Admitting: Internal Medicine

## 2012-12-05 DIAGNOSIS — S7290XD Unspecified fracture of unspecified femur, subsequent encounter for closed fracture with routine healing: Secondary | ICD-10-CM

## 2012-12-05 DIAGNOSIS — S7291XD Unspecified fracture of right femur, subsequent encounter for closed fracture with routine healing: Secondary | ICD-10-CM

## 2012-12-07 DIAGNOSIS — I2510 Atherosclerotic heart disease of native coronary artery without angina pectoris: Secondary | ICD-10-CM

## 2012-12-07 DIAGNOSIS — Z96649 Presence of unspecified artificial hip joint: Secondary | ICD-10-CM

## 2012-12-07 DIAGNOSIS — N189 Chronic kidney disease, unspecified: Secondary | ICD-10-CM

## 2012-12-07 NOTE — Telephone Encounter (Signed)
This was done 11/29/12

## 2012-12-08 NOTE — Progress Notes (Signed)
Patient ID: Larry West, male   DOB: [birth year], 79 y.o.   MRN: [id number]        PROGRESS NOTE  DATE:   11/29/2012      FACILITY:  [facility]   LEVEL OF CARE: SNF  Acute Visit  CHIEF COMPLAINT:  Manage anemia of chronic kidney disease, chronic kidney disease stage III and hyperglycemia.    HISTORY OF PRESENT ILLNESS: I was requested by the staff to assess the patient regarding above problem(s):   CHRONIC KIDNEY DISEASE: The patient's chronic kidney disease is unstable.  On 11/24/2011, BUN was 44, creatinine 2.35.  At discharge from the hospital, BUN was 44, creatinine 2.18.  Patient denies increasing lower extremity swelling or confusion.    ANEMIA:  On 11/23/2012, hemoglobin was 9.4, MCV 98.  At discharge from the hospital, hemoglobin was 8.  The patient is on darbepoetin.  The patient denies fatigue, melena or hematochezia. No complications from the medications currently being used.   HYPERGLYCEMIA.  New problem.  On 11/23/2012, patient's blood glucose was 120.  He does not have a history of diabetes mellitus and he is not on prednisone.  He denies polyuria or polydipsia.    PAST MEDICAL HISTORY : Reviewed.  No changes.  CURRENT MEDICATIONS: Reviewed per [facility]  REVIEW OF SYSTEMS:  GENERAL: no change in appetite, no fatigue, no weight changes, no fever, chills or weakness RESPIRATORY: no cough, SOB, DOE,, wheezing, hemoptysis CARDIAC: no chest pain, edema or palpitations GI: no abdominal pain, diarrhea, constipation, heart burn, nausea or vomiting MUSCULOSKELETAL:  Complains of right knee swelling and pain.    PHYSICAL EXAMINATION  VS:  T        P       RR       BP      POX %       WT (Lb)  GENERAL: no acute distress, normal body habitus EYES: conjunctivae normal, sclerae normal, normal eye lids NECK: supple, trachea midline, no neck masses, no thyroid tenderness, no thyromegaly LYMPHATICS: no LAN in the neck, no supraclavicular LAN RESPIRATORY: breathing is even &  unlabored, BS CTAB CARDIAC: RRR, no murmur,no extra heart sounds, no edema GI: abdomen soft, normal BS, no masses, no tenderness, no hepatomegaly, no splenomegaly PSYCHIATRIC: the patient is alert & oriented to person, affect & behavior appropriate MUSCULOSKELETAL:   EXTREMITIES:   RIGHT LOWER EXTREMITY:  Right knee is edematous and tender to palpation.  Range of motion limited due to the pain.    LABS/RADIOLOGY: On 11/23/2012:  Platelet count 384.    At hospital discharge:  Platelet count 156.    ASSESSMENT/PLAN:  Chronic kidney disease stage III.  Unstable problem.  Renal functions increased.  Reassess.    Anemia of chronic kidney disease.  Hemoglobin improved.    Hyperglycemia.  New problem.  Check fasting glucose level and  hemoglobin A1C.    Thrombocytosis.  New problem.  Likely due to acute phase reactant.  We will monitor.  Right knee pain and swelling.  New problem, but no sign of infection.  We will apply ice pack for 15 minutes t.i.d. for the next four days.     CPT CODE: 40981.

## 2012-12-09 ENCOUNTER — Ambulatory Visit (HOSPITAL_BASED_OUTPATIENT_CLINIC_OR_DEPARTMENT_OTHER): Payer: Medicare HMO

## 2012-12-09 ENCOUNTER — Other Ambulatory Visit (HOSPITAL_BASED_OUTPATIENT_CLINIC_OR_DEPARTMENT_OTHER): Payer: Medicare HMO | Admitting: Lab

## 2012-12-09 VITALS — BP 140/73 | HR 66 | Temp 97.3°F

## 2012-12-09 DIAGNOSIS — F1020 Alcohol dependence, uncomplicated: Secondary | ICD-10-CM

## 2012-12-09 DIAGNOSIS — D649 Anemia, unspecified: Secondary | ICD-10-CM

## 2012-12-09 DIAGNOSIS — D519 Vitamin B12 deficiency anemia, unspecified: Secondary | ICD-10-CM

## 2012-12-09 DIAGNOSIS — R569 Unspecified convulsions: Secondary | ICD-10-CM

## 2012-12-09 DIAGNOSIS — M545 Low back pain: Secondary | ICD-10-CM

## 2012-12-09 DIAGNOSIS — I739 Peripheral vascular disease, unspecified: Secondary | ICD-10-CM

## 2012-12-09 DIAGNOSIS — Z86010 Personal history of colonic polyps: Secondary | ICD-10-CM

## 2012-12-09 DIAGNOSIS — D539 Nutritional anemia, unspecified: Secondary | ICD-10-CM

## 2012-12-09 DIAGNOSIS — D631 Anemia in chronic kidney disease: Secondary | ICD-10-CM

## 2012-12-09 DIAGNOSIS — I10 Essential (primary) hypertension: Secondary | ICD-10-CM

## 2012-12-09 LAB — CBC WITH DIFFERENTIAL/PLATELET
Eosinophils Absolute: 0.1 10*3/uL (ref 0.0–0.5)
MCV: 101 fL — ABNORMAL HIGH (ref 79.3–98.0)
MONO%: 4.9 % (ref 0.0–14.0)
NEUT#: 3.2 10*3/uL (ref 1.5–6.5)
RBC: 3.13 10*6/uL — ABNORMAL LOW (ref 4.20–5.82)
RDW: 18.9 % — ABNORMAL HIGH (ref 11.0–14.6)
WBC: 4.9 10*3/uL (ref 4.0–10.3)
lymph#: 1.4 10*3/uL (ref 0.9–3.3)

## 2012-12-09 MED ORDER — CYANOCOBALAMIN 1000 MCG/ML IJ SOLN
1000.0000 ug | Freq: Once | INTRAMUSCULAR | Status: AC
  Administered 2012-12-09: 1000 ug via INTRAMUSCULAR

## 2012-12-09 MED ORDER — DARBEPOETIN ALFA-POLYSORBATE 300 MCG/0.6ML IJ SOLN
300.0000 ug | Freq: Once | INTRAMUSCULAR | Status: AC
  Administered 2012-12-09: 300 ug via SUBCUTANEOUS
  Filled 2012-12-09: qty 0.6

## 2012-12-09 NOTE — Patient Instructions (Addendum)
Cyanocobalamin, Vitamin B12 injection What is this medicine? CYANOCOBALAMIN (sye an oh koe BAL a min) is a man made form of vitamin B12. Vitamin B12 is used in the growth of healthy blood cells, nerve cells, and proteins in the body. It also helps with the metabolism of fats and carbohydrates. This medicine is used to treat people who can not absorb vitamin B12. This medicine may be used for other purposes; ask your health care provider or pharmacist if you have questions. What should I tell my health care provider before I take this medicine? They need to know if you have any of these conditions: -kidney disease -Leber's disease -megaloblastic anemia -an unusual or allergic reaction to cyanocobalamin, cobalt, other medicines, foods, dyes, or preservatives -pregnant or trying to get pregnant -breast-feeding How should I use this medicine? This medicine is injected into a muscle or deeply under the skin. It is usually given by a health care professional in a clinic or doctor's office. However, your doctor may teach you how to inject yourself. Follow all instructions. Talk to your pediatrician regarding the use of this medicine in children. Special care may be needed. Overdosage: If you think you have taken too much of this medicine contact a poison control center or emergency room at once. NOTE: This medicine is only for you. Do not share this medicine with others. What if I miss a dose? If you are given your dose at a clinic or doctor's office, call to reschedule your appointment. If you give your own injections and you miss a dose, take it as soon as you can. If it is almost time for your next dose, take only that dose. Do not take double or extra doses. What may interact with this medicine? -colchicine -heavy alcohol intake This list may not describe all possible interactions. Give your health care provider a list of all the medicines, herbs, non-prescription drugs, or dietary supplements you  use. Also tell them if you smoke, drink alcohol, or use illegal drugs. Some items may interact with your medicine. What should I watch for while using this medicine? Visit your doctor or health care professional regularly. You may need blood work done while you are taking this medicine. You may need to follow a special diet. Talk to your doctor. Limit your alcohol intake and avoid smoking to get the best benefit. What side effects may I notice from receiving this medicine? Side effects that you should report to your doctor or health care professional as soon as possible: -allergic reactions like skin rash, itching or hives, swelling of the face, lips, or tongue -blue tint to skin -chest tightness, pain -difficulty breathing, wheezing -dizziness -red, swollen painful area on the leg Side effects that usually do not require medical attention (report to your doctor or health care professional if they continue or are bothersome): -diarrhea -headache This list may not describe all possible side effects. Call your doctor for medical advice about side effects. You may report side effects to FDA at 1-800-FDA-1088. Where should I keep my medicine? Keep out of the reach of children. Store at room temperature between 15 and 30 degrees C (59 and 85 degrees F). Protect from light. Throw away any unused medicine after the expiration date. NOTE: This sheet is a summary. It may not cover all possible information. If you have questions about this medicine, talk to your doctor, pharmacist, or health care provider.  2012, [employer]vier/Gold Standard. (11/29/2007 10:10:20 PM)Darbepoetin Alfa injection What is this medicine? DARBEPOETIN  ALFA (dar be POE e tin AL fa) helps your body make more red blood cells. It is used to treat anemia caused by chronic kidney failure and chemotherapy. This medicine may be used for other purposes; ask your health care provider or pharmacist if you have questions. What should I tell my  health care provider before I take this medicine? They need to know if you have any of these conditions: -blood clotting disorders or history of blood clots -cancer patient not on chemotherapy -cystic fibrosis -heart disease, such as angina, heart failure, or a history of a heart attack -hemoglobin level of 12 g/dL or greater -high blood pressure -low levels of folate, iron, or vitamin B12 -seizures -an unusual or allergic reaction to darbepoetin, erythropoietin, albumin, hamster proteins, latex, other medicines, foods, dyes, or preservatives -pregnant or trying to get pregnant -breast-feeding How should I use this medicine? This medicine is for injection into a vein or under the skin. It is usually given by a health care professional in a hospital or clinic setting. If you get this medicine at home, you will be taught how to prepare and give this medicine. Do not shake the solution before you withdraw a dose. Use exactly as directed. Take your medicine at regular intervals. Do not take your medicine more often than directed. It is important that you put your used needles and syringes in a special sharps container. Do not put them in a trash can. If you do not have a sharps container, call your pharmacist or healthcare provider to get one. Talk to your pediatrician regarding the use of this medicine in children. While this medicine may be used in children as young as 1 year for selected conditions, precautions do apply. Overdosage: If you think you have taken too much of this medicine contact a poison control center or emergency room at once. NOTE: This medicine is only for you. Do not share this medicine with others. What if I miss a dose? If you miss a dose, take it as soon as you can. If it is almost time for your next dose, take only that dose. Do not take double or extra doses. What may interact with this medicine? Do not take this medicine with any of the following medications: -epoetin  alfa This list may not describe all possible interactions. Give your health care provider a list of all the medicines, herbs, non-prescription drugs, or dietary supplements you use. Also tell them if you smoke, drink alcohol, or use illegal drugs. Some items may interact with your medicine. What should I watch for while using this medicine? Visit your prescriber or health care professional for regular checks on your progress and for the needed blood tests and blood pressure measurements. It is especially important for the doctor to make sure your hemoglobin level is in the desired range, to limit the risk of potential side effects and to give you the best benefit. Keep all appointments for any recommended tests. Check your blood pressure as directed. Ask your doctor what your blood pressure should be and when you should contact him or her. As your body makes more red blood cells, you may need to take iron, folic acid, or vitamin B supplements. Ask your doctor or health care provider which products are right for you. If you have kidney disease continue dietary restrictions, even though this medication can make you feel better. Talk with your doctor or health care professional about the foods you eat and the vitamins that  you take. What side effects may I notice from receiving this medicine? Side effects that you should report to your doctor or health care professional as soon as possible: -allergic reactions like skin rash, itching or hives, swelling of the face, lips, or tongue -breathing problems -changes in vision -chest pain -confusion, trouble speaking or understanding -feeling faint or lightheaded, falls -high blood pressure -muscle aches or pains -pain, swelling, warmth in the leg -rapid weight gain -severe headaches -sudden numbness or weakness of the face, arm or leg -trouble walking, dizziness, loss of balance or coordination -seizures (convulsions) -swelling of the ankles, feet,  hands -unusually weak or tired Side effects that usually do not require medical attention (report to your doctor or health care professional if they continue or are bothersome): -diarrhea -fever, chills (flu-like symptoms) -headaches -nausea, vomiting -redness, stinging, or swelling at site where injected This list may not describe all possible side effects. Call your doctor for medical advice about side effects. You may report side effects to FDA at 1-800-FDA-1088. Where should I keep my medicine? Keep out of the reach of children. Store in a refrigerator between 2 and 8 degrees C (36 and 46 degrees F). Do not freeze. Do not shake. Throw away any unused portion if using a single-dose vial. Throw away any unused medicine after the expiration date. NOTE: This sheet is a summary. It may not cover all possible information. If you have questions about this medicine, talk to your doctor, pharmacist, or health care provider.  2013, Elsevier/Gold Standard. (08/01/2008 10:23:57 AM)

## 2012-12-14 NOTE — Progress Notes (Shared)
Patient ID: JIMMEY HENGEL, male   DOB: Oct 14, 1925, 79 y.o.   MRN: [id number]          PROGRESS NOTE  DATE:   12/03/2012   FACILITY:  [facility]   LEVEL OF CARE:   SNF   Acute Visit   CHIEF COMPLAINT:  Pre-discharge review.  HISTORY OF PRESENT ILLNESS:   Mr. Pullara is a gentleman who came here on 11/15/2012.  He has been here rehabilitating after suffering a closed right hip fracture, status post ORIF.  This was done by Dr. Magnus Ivan.  He does not have a history of falls.  This seems to be a once-in-a-lifetime event.   He apparently used a walker before he fractured his hip and is now up walking with a walker; and according to the information we have, he is walking as well as he did beforehand.     His major medical comorbidities include stage III chronic renal failure.  Reviewing these results shows a BUN of 44 and a creatinine of 2.35.  A followup was ordered.  I do not see this result and I will see if we can track this down.    His potassium on 11/24/2012 was 4.7, hemoglobin 9.4 and this is actually up from discharge from the hospital.   PHYSICAL EXAMINATION:   GENERAL APPEARANCE:  Very pleasant man who seems well orientated and well aware.  The discharge is to his double-wide trailer on [address]  He lives with a caregiver.   CHEST/RESPIRATORY:  Clear air entry bilaterally.   CARDIOVASCULAR:  CARDIAC:   Heart sounds are normal.  He has a cardiac stent.  He is not experiencing chest pain.   SKIN:  INSPECTION:  Incision on the right hip looks fine.  Staples are still in.  However, he  apparently has an appointment with Dr. Magnus Ivan on Monday.   NEUROLOGICAL:    BALANCE/GAIT:  I did not ambulate him.     ASSESSMENT/PLAN:  Status post right hip fracture.  This all appears stable to me.  I see no issues here.  He has already completed his Lovenox that he was on for DVT prophylaxis.    Chronic renal insufficiency.  He is not on an ACE inhibitor.  He is on a regular  adult-strength aspirin.    I have written his scripts.  He will need PT, OT, skilled nursing.  He will need a rolling walker and a tub transfer bench.     CPT CODE: 44010.  ADDENDUM:  I have just gotten hold of the fact that the patient is on Aranesp.  I had previously noted that the hemoglobin has gone up nicely to 9.4 from a post hospital value of 8.  The patient is receiving Aranesp 200 mcg every four weeks.  He has received one dose here.  I am uncertain about who plans to give this 28 days from now, who plans to follow the hemoglobin.  He will need to obviously follow up with a physician for this.  We have verified he was not on this prior to the hospitalization.  I have written a script, although I am not exactly sure how this is going to be arranged at this point.

## 2012-12-23 ENCOUNTER — Other Ambulatory Visit: Payer: Self-pay | Admitting: Family Medicine

## 2012-12-25 ENCOUNTER — Other Ambulatory Visit: Payer: Self-pay | Admitting: Family Medicine

## 2012-12-28 ENCOUNTER — Telehealth: Payer: Self-pay | Admitting: Family Medicine

## 2012-12-28 NOTE — Telephone Encounter (Signed)
?   OK to Refill  

## 2012-12-29 MED ORDER — HYDROCODONE-ACETAMINOPHEN 7.5-325 MG PO TABS: 1.0000 | ORAL_TABLET | ORAL | Status: DC | PRN

## 2012-12-29 NOTE — Telephone Encounter (Signed)
Rx Refilled  

## 2012-12-29 NOTE — Telephone Encounter (Signed)
Ok to refill 

## 2013-01-04 ENCOUNTER — Ambulatory Visit (INDEPENDENT_AMBULATORY_CARE_PROVIDER_SITE_OTHER): Payer: Medicare HMO | Admitting: Family Medicine

## 2013-01-04 ENCOUNTER — Encounter: Payer: Self-pay | Admitting: Family Medicine

## 2013-01-04 VITALS — BP 128/74 | HR 60 | Temp 97.4°F | Resp 14 | Wt 150.0 lb

## 2013-01-04 DIAGNOSIS — R296 Repeated falls: Secondary | ICD-10-CM

## 2013-01-04 DIAGNOSIS — G40909 Epilepsy, unspecified, not intractable, without status epilepticus: Secondary | ICD-10-CM

## 2013-01-04 DIAGNOSIS — N184 Chronic kidney disease, stage 4 (severe): Secondary | ICD-10-CM

## 2013-01-04 DIAGNOSIS — Z9181 History of falling: Secondary | ICD-10-CM

## 2013-01-04 DIAGNOSIS — R42 Dizziness and giddiness: Secondary | ICD-10-CM

## 2013-01-04 LAB — HEPATIC FUNCTION PANEL
AST: 15 U/L (ref 0–37)
Bilirubin, Direct: 0.1 mg/dL (ref 0.0–0.3)
Total Bilirubin: 0.3 mg/dL (ref 0.3–1.2)

## 2013-01-04 LAB — BASIC METABOLIC PANEL
BUN: 63 mg/dL — ABNORMAL HIGH (ref 6–23)
Creat: 2.27 mg/dL — ABNORMAL HIGH (ref 0.50–1.35)
Potassium: 4.9 mEq/L (ref 3.5–5.3)

## 2013-01-04 NOTE — Progress Notes (Signed)
Subjective:    Patient ID: Larry West, male    DOB: 20-Sep-1925, 79 y.o.   MRN: [id number]  HPI  Patient is here for hospital followup. He was last admitted in March after a fall and hip fracture. Patient states that he had a sudden episode of vertigo which caused him to fall he broke his hip. He continues to have these frequent episodes of falling. He states he has vertigo on nearly a daily basis. Head CT in March was negative for hemorrhagic infarct or tumor. However he has not had an MRI to evaluate his cerebellum.  He also has severe chronic kidney disease stage IV. He has not thought of nephrology due to issues with his insurance. He was seeing a nephrologist at [facility]. This needs to be read established. He is currently receiving Dr. Gaylyn Rong with hematology for Aranesp injections due to his chronic anemia which is combination blood loss and also chronic kidney disease and bone marrow insufficiency. Past Medical History  Diagnosis Date  . Hypertension   . Chronic low back pain     right leg pain   . Hip pain     left  . Seizures 2006  . ETOHism   . Cataract   . CAD (coronary artery disease) 2001    s/p PTCI  . PVD (posterior vitreous detachment), left eye   . Anemia     CKD  . Colon polyps   . Chronic kidney disease (CKD), stage III (moderate)   . Hyperlipidemia   . Hyperkalemia   . Bone pain 09/16/2012  . Vitamin B12 deficiency anemia 10/21/2012   Current Outpatient Prescriptions on File Prior to Visit  Medication Sig Dispense Refill  . amLODipine (NORVASC) 10 MG tablet Take 10 mg by mouth every morning.      Marland Kitchen atorvastatin (LIPITOR) 40 MG tablet Take 40 mg by mouth at bedtime.       . clopidogrel (PLAVIX) 75 MG tablet TAKE ONE TABLET BY MOUTH EVERY DAY  30 tablet  1  . darbepoetin (ARANESP) 200 MCG/0.4ML SOLN Inject 200 mcg into the skin every 28 (twenty-eight) days. Due 11/11/12      . docusate sodium 100 MG CAPS Take 100 mg by mouth 2 (two) times daily as needed for  constipation.  10 capsule    . HYDROcodone-acetaminophen (NORCO) 7.5-325 MG per tablet Take 1 tablet by mouth every 4 (four) hours as needed for pain.  120 tablet  0  . meclizine (ANTIVERT) 12.5 MG tablet Take 1 tablet (12.5 mg total) by mouth 2 (two) times daily.  30 tablet     No current facility-administered medications on file prior to visit.   Allergies  Allergen Reactions  . Darvocet (Propoxyphene-Acetaminophen) Nausea Only  . Penicillins Swelling    Unknown    . Sulfonamide Derivatives Itching   History   Social History  . Marital Status: Divorced    Spouse Name: N/A    Number of Children: 6  . Years of Education: N/A   Occupational History  . RETIRED     rancher   Social History Main Topics  . Smoking status: Never Smoker   . Smokeless tobacco: Never Used  . Alcohol Use: 4.8 oz/week    8 Shots of liquor per week     Comment: patient reports he drinks 1/2 pint of gin from the bottle each night  . Drug Use: No  . Sexually Active: No   Other Topics Concern  . Not on file  Social History Narrative   Divorced.  Lives with an "adopted granddaughter", after being kicked out of the house by his son.  No ETOH since 08/2012.  Normally ambulates with a walker.       Review of Systems  All other systems reviewed and are negative.       Objective:   Physical Exam  Constitutional: He is oriented to person, place, and time. He appears well-developed and well-nourished.  Neck: Neck supple. No JVD present. No thyromegaly present.  Cardiovascular: Normal rate, regular rhythm and normal heart sounds.  Exam reveals no gallop.   No murmur heard. Pulmonary/Chest: Effort normal and breath sounds normal. No respiratory distress. He has no wheezes. He has no rales. He exhibits no tenderness.  Abdominal: Soft. Bowel sounds are normal. He exhibits no distension. There is no tenderness. There is no rebound and no guarding.  Lymphadenopathy:    He has no cervical adenopathy.   Neurological: He is alert and oriented to person, place, and time. He has normal reflexes. He displays normal reflexes. No cranial nerve deficit. He exhibits normal muscle tone. Coordination normal.          Assessment & Plan:  1. Seizure disorder Check phenytoin level to ensure that supratherapeutic phenytoin cannot be contributing to ataxia and falls. - Phenytoin level, free - Basic Metabolic Panel - Hepatic Function Panel - MR Brain W Wo Contrast; Future  2. Falls frequently Patient is currently taking meclizine for BPPV. However that check an MRI to make sure he has not had some type of cerebellar infarct which could be explaining his frequent daily vertigo - MR Brain W Wo Contrast; Future  3. Vertigo See problem #1 in problem #2. Await results of MRI - MR Brain W Wo Contrast; Future  4. Chronic kidney disease (CKD), stage IV (severe) Consult nephrology at [facility]. - Ambulatory referral to Nephrology

## 2013-01-05 ENCOUNTER — Other Ambulatory Visit: Payer: Self-pay | Admitting: Oncology

## 2013-01-05 DIAGNOSIS — D638 Anemia in other chronic diseases classified elsewhere: Secondary | ICD-10-CM

## 2013-01-06 ENCOUNTER — Ambulatory Visit: Payer: Medicare HMO

## 2013-01-06 ENCOUNTER — Telehealth: Payer: Self-pay | Admitting: Oncology

## 2013-01-06 ENCOUNTER — Encounter: Payer: Self-pay | Admitting: Oncology

## 2013-01-06 ENCOUNTER — Other Ambulatory Visit (HOSPITAL_BASED_OUTPATIENT_CLINIC_OR_DEPARTMENT_OTHER): Payer: Medicare HMO | Admitting: Lab

## 2013-01-06 ENCOUNTER — Ambulatory Visit (HOSPITAL_BASED_OUTPATIENT_CLINIC_OR_DEPARTMENT_OTHER): Payer: Medicare HMO | Admitting: Oncology

## 2013-01-06 VITALS — BP 180/88 | HR 60 | Temp 97.0°F | Resp 20 | Ht 67.5 in | Wt 150.3 lb

## 2013-01-06 DIAGNOSIS — I10 Essential (primary) hypertension: Secondary | ICD-10-CM

## 2013-01-06 DIAGNOSIS — D638 Anemia in other chronic diseases classified elsewhere: Secondary | ICD-10-CM

## 2013-01-06 DIAGNOSIS — N189 Chronic kidney disease, unspecified: Secondary | ICD-10-CM

## 2013-01-06 DIAGNOSIS — I739 Peripheral vascular disease, unspecified: Secondary | ICD-10-CM

## 2013-01-06 DIAGNOSIS — D649 Anemia, unspecified: Secondary | ICD-10-CM

## 2013-01-06 DIAGNOSIS — M545 Low back pain: Secondary | ICD-10-CM

## 2013-01-06 DIAGNOSIS — Z86010 Personal history of colonic polyps: Secondary | ICD-10-CM

## 2013-01-06 DIAGNOSIS — E785 Hyperlipidemia, unspecified: Secondary | ICD-10-CM

## 2013-01-06 DIAGNOSIS — R569 Unspecified convulsions: Secondary | ICD-10-CM

## 2013-01-06 DIAGNOSIS — F1020 Alcohol dependence, uncomplicated: Secondary | ICD-10-CM

## 2013-01-06 LAB — CBC WITH DIFFERENTIAL/PLATELET
Basophils Absolute: 0.1 10*3/uL (ref 0.0–0.1)
Eosinophils Absolute: 0.1 10*3/uL (ref 0.0–0.5)
HCT: 32.5 % — ABNORMAL LOW (ref 38.4–49.9)
LYMPH%: 28.7 % (ref 14.0–49.0)
MONO#: 0.5 10*3/uL (ref 0.1–0.9)
NEUT#: 3.4 10*3/uL (ref 1.5–6.5)
NEUT%: 59.3 % (ref 39.0–75.0)
Platelets: 220 10*3/uL (ref 140–400)
WBC: 5.8 10*3/uL (ref 4.0–10.3)

## 2013-01-06 LAB — COMPREHENSIVE METABOLIC PANEL (CC13)
CO2: 19 mEq/L — ABNORMAL LOW (ref 22–29)
Creatinine: 2.4 mg/dL — ABNORMAL HIGH (ref 0.7–1.3)
Glucose: 98 mg/dl (ref 70–99)
Total Bilirubin: 0.25 mg/dL (ref 0.20–1.20)
Total Protein: 6.8 g/dL (ref 6.4–8.3)

## 2013-01-06 MED ORDER — DARBEPOETIN ALFA-POLYSORBATE 300 MCG/0.6ML IJ SOLN: 300.0000 ug | Freq: Once | INTRAMUSCULAR | Status: DC

## 2013-01-06 NOTE — Telephone Encounter (Signed)
gv and printed appt sched and avs for Sept

## 2013-01-06 NOTE — Progress Notes (Signed)
[facility]  Telephone:([phone] [phone]   OFFICE PROGRESS NOTE   Cc:  PICKARD,[name], MD  DIAGNOSIS:  Anemia of chronic kidney disease  CURRENT THERAPY:  Monthly Aranesp for Hgb <11.   INTERVAL HISTORY: Larry West 79 y.o. male returns for regular follow up with a family friend. He has been in the hospital recently due to a right hip fracture. He is status post ORIF. Thereafter he went to a skilled nursing facility for physical therapy. He stay there approximately 3 weeks. He is now at home receiving home physical therapy. He still has severe diffuse bone pain where he has fractures.  Using Lortab as needed for his pain. He does have generalized fatigue; SOB; and DOE without edema, orthopnea, PND.  He reports that his teeth are loose and needs them to be extracted.  Patient denies fever, headache, visual changes, confusion, drenching night sweats, palpable lymph node swelling, mucositis, odynophagia, dysphagia, nausea vomiting, jaundice, productive cough, gum bleeding, epistaxis, hematemesis, hemoptysis, abdominal pain, abdominal swelling, early satiety, melena, hematochezia, hematuria, skin rash, spontaneous bleeding,heat or cold intolerance, bowel bladder incontinence, focal motor weakness, paresthesia, depression.      Past Medical History  Diagnosis Date  . Hypertension   . Chronic low back pain     right leg pain   . Hip pain     left  . Seizures 2006  . ETOHism   . Cataract   . CAD (coronary artery disease) 2001    s/p PTCI  . PVD (posterior vitreous detachment), left eye   . Anemia     CKD  . Colon polyps   . Chronic kidney disease (CKD), stage III (moderate)   . Hyperlipidemia   . Hyperkalemia   . Bone pain 09/16/2012  . Vitamin B12 deficiency anemia 10/21/2012    Past Surgical History  Procedure Laterality Date  . Cataracts surgery    . Appendectomy  1955  . Gastric outlet obstruction surgery  1988  . Lumbar disc surgery     . Femur im nail Right 11/11/2012    Procedure: INTRAMEDULLARY (IM) NAIL FEMORAL;  Surgeon: Kathryne Hitch, MD;  Location: [facility];  Service: Orthopedics;  Laterality: Right;    Current Outpatient Prescriptions  Medication Sig Dispense Refill  . amLODipine (NORVASC) 10 MG tablet Take 10 mg by mouth every morning.      Marland Kitchen atorvastatin (LIPITOR) 40 MG tablet Take 40 mg by mouth at bedtime.       . clopidogrel (PLAVIX) 75 MG tablet TAKE ONE TABLET BY MOUTH EVERY DAY  30 tablet  1  . darbepoetin (ARANESP) 200 MCG/0.4ML SOLN Inject 200 mcg into the skin every 28 (twenty-eight) days. Due 11/11/12      . ferrous sulfate 324 (65 FE) MG TBEC Take by mouth daily.      Marland Kitchen HYDROcodone-acetaminophen (NORCO) 7.5-325 MG per tablet Take 1 tablet by mouth every 4 (four) hours as needed for pain.  120 tablet  0  . meclizine (ANTIVERT) 12.5 MG tablet Take 1 tablet (12.5 mg total) by mouth 2 (two) times daily.  30 tablet    . phenytoin (DILANTIN) 100 MG ER capsule Take 200 mg by mouth daily. 3 tab po qam and 2 tab po qpm       No current facility-administered medications for this visit.    ALLERGIES:  is allergic to darvocet; penicillins; and sulfonamide derivatives.  REVIEW OF SYSTEMS:  The rest of the 14-point review of system  was negative.   Filed Vitals:   01/06/13 0841  BP: 180/88  Pulse: 60  Temp: 97 F (36.1 C)  Resp: 20   Wt Readings from Last 3 Encounters:  01/06/13 150 lb 4.8 oz (68.176 kg)  01/04/13 150 lb (68.04 kg)  11/11/12 147 lb (66.679 kg)   ECOG Performance status: 2  PHYSICAL EXAMINATION:   General:  Tired appearing, unkempt man, in no acute distress.  Eyes:  no scleral icterus.  ENT:  There were no oropharyngeal lesions.  Neck was without thyromegaly.  Lymphatics:  Negative cervical, supraclavicular or axillary adenopathy.  Respiratory: lungs were clear bilaterally without wheezing or crackles.  Cardiovascular:  Regular rate and rhythm, S1/S2, without murmur, rub or  gallop.  There was no pedal edema.  GI:  abdomen was soft, flat, nontender, nondistended, without organomegaly.  Muscoloskeletal:  no spinal tenderness of palpation of vertebral spine.  Mild discomfort of bilateral anterior ribs.   Skin exam was without echymosis, petichae.  Neuro exam was nonfocal.  Patient was able to get on and off exam table with minimal assistance. He needed his wheelchair.  Patient was alerted and oriented.  Attention was good.   Language was appropriate.  Mood was normal without depression.  Speech was not pressured.  Thought content was not tangential.      LABORATORY/RADIOLOGY DATA:  Lab Results  Component Value Date   WBC 5.8 01/06/2013   HGB 11.1* 01/06/2013   HCT 32.5* 01/06/2013   PLT 220 01/06/2013   GLUCOSE 79 01/04/2013   ALKPHOS 132* 01/04/2013   ALT 12 01/04/2013   AST 15 01/04/2013   NA 136 01/04/2013   K 4.9 01/04/2013   CL 107 01/04/2013   CREATININE 2.27* 01/04/2013   BUN 63* 01/04/2013   CO2 19 01/04/2013   INR 0.95 11/11/2012     ASSESSMENT AND PLAN:   1. Hypertension: He is on amlodipine.  2. Chronic kidney disease, stage IIIB: Due to most likely HTN. He f/u with nephrology. Past testing was neg for myeloma.  3. CAD history: He is on Plavix and atorvastatin. His Plavix was discontinued given recent hemoperitoneum.  4. Hyperlipidemia: He is on atorvastatin per Dr. Tanya Nones.  5. Seizure: He is on Dilantin per Dr. Tanya Nones.  6. History of EtOH: He claims that he is not drinking alcohol anymore. He denies DT or seizure. 7. Chronic macrocytic anemia:  - due to most likely CKD.  Extensive work up in the past for VitB12/iron/SPEP were negative.  - He has been receiving Aranesp with improvement in his hemoglobin. - Recommendation:  Continue Aranesp injection monthly if hemoglobin is less than 11. Hemoglobin is 11.1 today and no Aranesp will be given. 8. Poor dentition: I have referred him to a dentist for consideration of tooth extraction. 9. Follow up: monthly CBC and  Aranesp injection for Hgb <11. He has follow up in about 4 months.       The length of time of the face-to-face encounter was 15 minutes. More than 50% of time was spent counseling and coordination of care.

## 2013-01-08 LAB — PHENYTOIN LEVEL, FREE AND TOTAL
Phenytoin, Free: 3.1 mg/L — ABNORMAL HIGH (ref 1.0–2.0)
Phenytoin, Total: 21.9 mg/L — ABNORMAL HIGH (ref 10.0–20.0)

## 2013-01-10 ENCOUNTER — Other Ambulatory Visit: Payer: Self-pay | Admitting: Family Medicine

## 2013-01-10 NOTE — Telephone Encounter (Signed)
?   OK to Refill  

## 2013-01-10 NOTE — Telephone Encounter (Signed)
Ok to fill, but he needs to stop lomotil.  He also needs to decrease phenytoin as discussed in previous note.

## 2013-01-14 ENCOUNTER — Ambulatory Visit: Payer: Medicare HMO | Admitting: Oncology

## 2013-01-14 ENCOUNTER — Other Ambulatory Visit: Payer: Medicare HMO | Admitting: Lab

## 2013-01-19 ENCOUNTER — Other Ambulatory Visit: Payer: Self-pay | Admitting: Family Medicine

## 2013-01-22 ENCOUNTER — Ambulatory Visit (HOSPITAL_BASED_OUTPATIENT_CLINIC_OR_DEPARTMENT_OTHER)
Admission: RE | Admit: 2013-01-22 | Discharge: 2013-01-22 | Disposition: A | Discharge status: Home / Self Care | Payer: Medicare HMO | Source: Ambulatory Visit | Attending: Family Medicine | Admitting: Family Medicine

## 2013-01-22 ENCOUNTER — Other Ambulatory Visit: Payer: Self-pay | Admitting: Family Medicine

## 2013-01-22 DIAGNOSIS — G319 Degenerative disease of nervous system, unspecified: Secondary | ICD-10-CM | POA: Insufficient documentation

## 2013-01-22 DIAGNOSIS — R296 Repeated falls: Secondary | ICD-10-CM

## 2013-01-22 DIAGNOSIS — G40909 Epilepsy, unspecified, not intractable, without status epilepticus: Secondary | ICD-10-CM | POA: Insufficient documentation

## 2013-01-22 DIAGNOSIS — R42 Dizziness and giddiness: Secondary | ICD-10-CM

## 2013-01-22 DIAGNOSIS — Z9181 History of falling: Secondary | ICD-10-CM | POA: Insufficient documentation

## 2013-01-25 ENCOUNTER — Other Ambulatory Visit: Payer: Self-pay | Admitting: Family Medicine

## 2013-01-25 ENCOUNTER — Encounter: Payer: Self-pay | Admitting: Family Medicine

## 2013-01-25 ENCOUNTER — Ambulatory Visit (INDEPENDENT_AMBULATORY_CARE_PROVIDER_SITE_OTHER): Payer: Medicare PPO | Admitting: Family Medicine

## 2013-01-25 VITALS — BP 130/70 | HR 82 | Temp 98.1°F | Resp 14 | Wt 153.0 lb

## 2013-01-25 DIAGNOSIS — G40909 Epilepsy, unspecified, not intractable, without status epilepticus: Secondary | ICD-10-CM

## 2013-01-25 DIAGNOSIS — M545 Low back pain: Secondary | ICD-10-CM

## 2013-01-25 DIAGNOSIS — R296 Repeated falls: Secondary | ICD-10-CM

## 2013-01-25 DIAGNOSIS — G8929 Other chronic pain: Secondary | ICD-10-CM

## 2013-01-25 DIAGNOSIS — Z9181 History of falling: Secondary | ICD-10-CM

## 2013-01-25 MED ORDER — HYDROCODONE-ACETAMINOPHEN 7.5-325 MG PO TABS: 1.0000 | ORAL_TABLET | ORAL | Status: DC | PRN

## 2013-01-25 MED ORDER — MECLIZINE HCL 12.5 MG PO TABS: 12.5000 mg | ORAL_TABLET | Freq: Two times a day (BID) | ORAL | Status: DC

## 2013-01-25 NOTE — Progress Notes (Signed)
Subjective:    Patient ID: Larry West, male    DOB: [birth year], 79 y.o.   MRN: [id number]  HPI  01/04/13 Patient is here for hospital followup. He was last admitted in March after a fall and hip fracture. Patient states that he had a sudden episode of vertigo which caused him to fall he broke his hip. He continues to have these frequent episodes of falling. He states he has vertigo on nearly a daily basis. Head CT in March was negative for hemorrhagic infarct or tumor. However he has not had an MRI to evaluate his cerebellum.  He also has severe chronic kidney disease stage IV. He has not thought of nephrology due to issues with his insurance. He was seeing a nephrologist at [facility]. This needs to be read established. He is currently receiving Dr. Gaylyn Rong with hematology for Aranesp injections due to his chronic anemia which is combination blood loss and also chronic kidney disease and bone marrow insufficiency.  Therefore, I diagnosed him with: 1. Seizure disorder Check phenytoin level to ensure that supratherapeutic phenytoin cannot be contributing to ataxia and falls. - Phenytoin level, free - Basic Metabolic Panel - Hepatic Function Panel - MR Brain W Wo Contrast; Future  2. Falls frequently Patient is currently taking meclizine for BPPV. However that check an MRI to make sure he has not had some type of cerebellar infarct which could be explaining his frequent daily vertigo - MR Brain W Wo Contrast; Future  3. Vertigo See problem #1 in problem #2. Await results of MRI - MR Brain W Wo Contrast; Future  01/25/13 He is here today for followup.  His MRI was negative for cerebellar stroke or acoustic neuroma.  He was found to have a supratherapeutic Dilantin level. Definitely decreased his Dilantin to 100 mg by mouth twice a day.  He states he is doing some better. He continues to have vertigo whenever he sits up in bed. That is the only time it happens. He is not following him since I saw him  last. He is requesting a refill on the meclizine.  He also wants his pain medication earlier than prescribed. He's been taking her that I recommended. He is supposed to take Norco 7.5/325 by mouth 4 times a day. He gets 120 per month. This was last refilled April 30. He had been out now for about a week. Past Medical History  Diagnosis Date  . Hypertension   . Chronic low back pain     right leg pain   . Hip pain     left  . Seizures 2006  . ETOHism   . Cataract   . CAD (coronary artery disease) 2001    s/p PTCI  . PVD (posterior vitreous detachment), left eye   . Anemia     CKD  . Colon polyps   . Chronic kidney disease (CKD), stage III (moderate)   . Hyperlipidemia   . Hyperkalemia   . Bone pain 09/16/2012  . Vitamin B12 deficiency anemia 10/21/2012   Current Outpatient Prescriptions on File Prior to Visit  Medication Sig Dispense Refill  . amLODipine (NORVASC) 10 MG tablet Take 10 mg by mouth every morning.      Marland Kitchen atorvastatin (LIPITOR) 40 MG tablet Take 40 mg by mouth at bedtime.       . calcitRIOL (ROCALTROL) 0.25 MCG capsule TAKE ONE CAPSULE BY MOUTH EVERY DAY  30 capsule  0  . clopidogrel (PLAVIX) 75 MG tablet TAKE ONE TABLET  BY MOUTH EVERY DAY  30 tablet  1  . darbepoetin (ARANESP) 200 MCG/0.4ML SOLN Inject 200 mcg into the skin every 28 (twenty-eight) days. Due 11/11/12      . diphenoxylate-atropine (LOMOTIL) 2.5-0.025 MG per tablet TAKE TWO TABLETS BY MOUTH EVERY 6 HOURS AS NEEDED FOR DIARRHEA  30 tablet  0  . ferrous sulfate 324 (65 FE) MG TBEC Take by mouth daily.      . phenytoin (DILANTIN) 100 MG ER capsule Take 100 mg by mouth daily.        No current facility-administered medications on file prior to visit.   Allergies  Allergen Reactions  . Darvocet (Propoxyphene-Acetaminophen) Nausea Only  . Penicillins Swelling    Unknown    . Sulfonamide Derivatives Itching   History   Social History  . Marital Status: Divorced    Spouse Name: N/A    Number of  Children: 6  . Years of Education: N/A   Occupational History  . RETIRED     rancher   Social History Main Topics  . Smoking status: Never Smoker   . Smokeless tobacco: Never Used  . Alcohol Use: 4.8 oz/week    8 Shots of liquor per week     Comment: patient reports he drinks 1/2 pint of gin from the bottle each night  . Drug Use: No  . Sexually Active: No   Other Topics Concern  . Not on file   Social History Narrative   Divorced.  Lives with an "adopted granddaughter", after being kicked out of the house by his son.  No ETOH since 08/2012.  Normally ambulates with a walker.       Review of Systems  All other systems reviewed and are negative.       Objective:   Physical Exam  Constitutional: He is oriented to person, place, and time. He appears well-developed and well-nourished.  Neck: Neck supple. No JVD present. No thyromegaly present.  Cardiovascular: Normal rate, regular rhythm and normal heart sounds.  Exam reveals no gallop.   No murmur heard. Pulmonary/Chest: Effort normal and breath sounds normal. No respiratory distress. He has no wheezes. He has no rales. He exhibits no tenderness.  Abdominal: Soft. Bowel sounds are normal. He exhibits no distension. There is no tenderness. There is no rebound and no guarding.  Lymphadenopathy:    He has no cervical adenopathy.  Neurological: He is alert and oriented to person, place, and time. He has normal reflexes. No cranial nerve deficit. He exhibits normal muscle tone. Coordination normal.          Assessment & Plan:  1. Seizure disorder Recheck the patient's Dilantin level. He reports a history of only one seizure. Therefore I feel a slightly low Dilantin level may be adequate for this patient. - Phenytoin level, free - Phenytoin level, total  2. Falls frequently This appears to be related to simple BPPV. He can continue meclizine 12.5 mg tablets by mouth twice a day. I also discussed with him past cautions. He  is instructed to take his time when getting up from a chair or bed. He is instructed to set up and wait several moments before trying to stand. 3. Chronic low back pain I gave the patient a refill on his Norco. I gave him 120 tablets. I wrote him a prescription that it is not to be filled until 529. I told him that I would not refill pain medication sooner than once per month. I cautioned him against  using excessive amounts of narcotics. He is to limit it to one tablet 4 times a day.

## 2013-01-26 LAB — PHENYTOIN LEVEL, TOTAL: Phenytoin Lvl: 2.6 ug/mL — ABNORMAL LOW (ref 10.0–20.0)

## 2013-01-30 LAB — PHENYTOIN LEVEL, FREE AND TOTAL
Phenytoin Bound: 1.8 mg/L
Phenytoin, Free: 0.8 mg/L — ABNORMAL LOW (ref 1.0–2.0)
Phenytoin, Total: 2.6 mg/L — ABNORMAL LOW (ref 10.0–20.0)

## 2013-02-02 ENCOUNTER — Telehealth: Payer: Self-pay | Admitting: Oncology

## 2013-02-02 NOTE — Telephone Encounter (Signed)
lvm for pt to contact family dentist for extraction

## 2013-02-03 ENCOUNTER — Other Ambulatory Visit: Payer: Self-pay | Admitting: Oncology

## 2013-02-03 ENCOUNTER — Encounter: Payer: Self-pay | Admitting: Oncology

## 2013-02-03 ENCOUNTER — Other Ambulatory Visit: Payer: Self-pay | Admitting: Family Medicine

## 2013-02-03 ENCOUNTER — Ambulatory Visit (HOSPITAL_BASED_OUTPATIENT_CLINIC_OR_DEPARTMENT_OTHER): Payer: Medicare HMO

## 2013-02-03 ENCOUNTER — Other Ambulatory Visit (HOSPITAL_BASED_OUTPATIENT_CLINIC_OR_DEPARTMENT_OTHER): Payer: Medicare HMO | Admitting: Lab

## 2013-02-03 VITALS — BP 142/77 | HR 85 | Temp 98.2°F

## 2013-02-03 DIAGNOSIS — D519 Vitamin B12 deficiency anemia, unspecified: Secondary | ICD-10-CM

## 2013-02-03 DIAGNOSIS — D539 Nutritional anemia, unspecified: Secondary | ICD-10-CM

## 2013-02-03 DIAGNOSIS — D631 Anemia in chronic kidney disease: Secondary | ICD-10-CM

## 2013-02-03 DIAGNOSIS — D649 Anemia, unspecified: Secondary | ICD-10-CM

## 2013-02-03 DIAGNOSIS — Z86010 Personal history of colonic polyps: Secondary | ICD-10-CM

## 2013-02-03 DIAGNOSIS — I739 Peripheral vascular disease, unspecified: Secondary | ICD-10-CM

## 2013-02-03 DIAGNOSIS — F1020 Alcohol dependence, uncomplicated: Secondary | ICD-10-CM

## 2013-02-03 DIAGNOSIS — R569 Unspecified convulsions: Secondary | ICD-10-CM

## 2013-02-03 DIAGNOSIS — D638 Anemia in other chronic diseases classified elsewhere: Secondary | ICD-10-CM

## 2013-02-03 DIAGNOSIS — M545 Low back pain: Secondary | ICD-10-CM

## 2013-02-03 DIAGNOSIS — N039 Chronic nephritic syndrome with unspecified morphologic changes: Secondary | ICD-10-CM

## 2013-02-03 DIAGNOSIS — I10 Essential (primary) hypertension: Secondary | ICD-10-CM

## 2013-02-03 LAB — COMPREHENSIVE METABOLIC PANEL (CC13)
ALT: 19 U/L (ref 0–55)
Albumin: 2.9 g/dL — ABNORMAL LOW (ref 3.5–5.0)
CO2: 15 mEq/L — ABNORMAL LOW (ref 22–29)
Chloride: 111 mEq/L — ABNORMAL HIGH (ref 98–107)
Glucose: 101 mg/dl — ABNORMAL HIGH (ref 70–99)
Potassium: 4.6 mEq/L (ref 3.5–5.1)
Sodium: 137 mEq/L (ref 136–145)
Total Bilirubin: 0.29 mg/dL (ref 0.20–1.20)
Total Protein: 6.5 g/dL (ref 6.4–8.3)

## 2013-02-03 LAB — CBC WITH DIFFERENTIAL/PLATELET
BASO%: 0.4 % (ref 0.0–2.0)
Eosinophils Absolute: 0.1 10*3/uL (ref 0.0–0.5)
LYMPH%: 22.3 % (ref 14.0–49.0)
MCHC: 33.3 g/dL (ref 32.0–36.0)
MONO#: 0.4 10*3/uL (ref 0.1–0.9)
NEUT#: 3.8 10*3/uL (ref 1.5–6.5)
RBC: 2.87 10*6/uL — ABNORMAL LOW (ref 4.20–5.82)
RDW: 16.2 % — ABNORMAL HIGH (ref 11.0–14.6)
WBC: 5.6 10*3/uL (ref 4.0–10.3)
lymph#: 1.2 10*3/uL (ref 0.9–3.3)

## 2013-02-03 MED ORDER — DARBEPOETIN ALFA-POLYSORBATE 300 MCG/0.6ML IJ SOLN
300.0000 ug | Freq: Once | INTRAMUSCULAR | Status: AC
  Administered 2013-02-03: 300 ug via SUBCUTANEOUS
  Filled 2013-02-03: qty 0.6

## 2013-02-03 MED ORDER — CYANOCOBALAMIN 1000 MCG/ML IJ SOLN
1000.0000 ug | Freq: Once | INTRAMUSCULAR | Status: AC
  Administered 2013-02-03: 1000 ug via INTRAMUSCULAR

## 2013-02-03 NOTE — Progress Notes (Signed)
Patient was in the office with Lenise and I. He needs assistance with bills. He doesn't have a bill over $5000.00 with [place]. I saw his bills and advised him to call them and advise that he needs help with is bills. I advised him of new policy for EPP. It is only him and he only gets [facility]. He did call his bank while in office with [place] and they faxed me a copy of his bank statement, for fund assistance for some bills. He did have him [employer] bill on him [phone]. I advised him I would call them to advise we would pay it for him. His rent is $1000. He will call me if he needs assistance else where.

## 2013-02-03 NOTE — Patient Instructions (Addendum)
Darbepoetin Alfa injection What is this medicine? DARBEPOETIN ALFA (dar be POE e tin AL fa) helps your body make more red blood cells. It is used to treat anemia caused by chronic kidney failure and chemotherapy. This medicine may be used for other purposes; ask your health care provider or pharmacist if you have questions. What should I tell my health care provider before I take this medicine? They need to know if you have any of these conditions: -blood clotting disorders or history of blood clots -cancer patient not on chemotherapy -cystic fibrosis -heart disease, such as angina, heart failure, or a history of a heart attack -hemoglobin level of 12 g/dL or greater -high blood pressure -low levels of folate, iron, or vitamin B12 -seizures -an unusual or allergic reaction to darbepoetin, erythropoietin, albumin, hamster proteins, latex, other medicines, foods, dyes, or preservatives -pregnant or trying to get pregnant -breast-feeding How should I use this medicine? This medicine is for injection into a vein or under the skin. It is usually given by a health care professional in a hospital or clinic setting. If you get this medicine at home, you will be taught how to prepare and give this medicine. Do not shake the solution before you withdraw a dose. Use exactly as directed. Take your medicine at regular intervals. Do not take your medicine more often than directed. It is important that you put your used needles and syringes in a special sharps container. Do not put them in a trash can. If you do not have a sharps container, call your pharmacist or healthcare provider to get one. Talk to your pediatrician regarding the use of this medicine in children. While this medicine may be used in children as young as 1 year for selected conditions, precautions do apply. Overdosage: If you think you have taken too much of this medicine contact a poison control center or emergency room at once. NOTE:  This medicine is only for you. Do not share this medicine with others. What if I miss a dose? If you miss a dose, take it as soon as you can. If it is almost time for your next dose, take only that dose. Do not take double or extra doses. What may interact with this medicine? Do not take this medicine with any of the following medications: -epoetin alfa This list may not describe all possible interactions. Give your health care provider a list of all the medicines, herbs, non-prescription drugs, or dietary supplements you use. Also tell them if you smoke, drink alcohol, or use illegal drugs. Some items may interact with your medicine. What should I watch for while using this medicine? Visit your prescriber or health care professional for regular checks on your progress and for the needed blood tests and blood pressure measurements. It is especially important for the doctor to make sure your hemoglobin level is in the desired range, to limit the risk of potential side effects and to give you the best benefit. Keep all appointments for any recommended tests. Check your blood pressure as directed. Ask your doctor what your blood pressure should be and when you should contact him or her. As your body makes more red blood cells, you may need to take iron, folic acid, or vitamin B supplements. Ask your doctor or health care provider which products are right for you. If you have kidney disease continue dietary restrictions, even though this medication can make you feel better. Talk with your doctor or health care professional about the   foods you eat and the vitamins that you take. What side effects may I notice from receiving this medicine? Side effects that you should report to your doctor or health care professional as soon as possible: -allergic reactions like skin rash, itching or hives, swelling of the face, lips, or tongue -breathing problems -changes in vision -chest pain -confusion, trouble speaking  or understanding -feeling faint or lightheaded, falls -high blood pressure -muscle aches or pains -pain, swelling, warmth in the leg -rapid weight gain -severe headaches -sudden numbness or weakness of the face, arm or leg -trouble walking, dizziness, loss of balance or coordination -seizures (convulsions) -swelling of the ankles, feet, hands -unusually weak or tired Side effects that usually do not require medical attention (report to your doctor or health care professional if they continue or are bothersome): -diarrhea -fever, chills (flu-like symptoms) -headaches -nausea, vomiting -redness, stinging, or swelling at site where injected This list may not describe all possible side effects. Call your doctor for medical advice about side effects. You may report side effects to FDA at 1-800-FDA-1088. Where should I keep my medicine? Keep out of the reach of children. Store in a refrigerator between 2 and 8 degrees C (36 and 46 degrees F). Do not freeze. Do not shake. Throw away any unused portion if using a single-dose vial. Throw away any unused medicine after the expiration date. NOTE: This sheet is a summary. It may not cover all possible information. If you have questions about this medicine, talk to your doctor, pharmacist, or health care provider.  2013, Elsevier/Gold Standard. (08/01/2008 10:23:57 AM) Cyanocobalamin, Vitamin B12 injection What is this medicine? CYANOCOBALAMIN (sye an oh koe BAL a min) is a man made form of vitamin B12. Vitamin B12 is used in the growth of healthy blood cells, nerve cells, and proteins in the body. It also helps with the metabolism of fats and carbohydrates. This medicine is used to treat people who can not absorb vitamin B12. This medicine may be used for other purposes; ask your health care provider or pharmacist if you have questions. What should I tell my health care provider before I take this medicine? They need to know if you have any of these  conditions: -kidney disease -Leber's disease -megaloblastic anemia -an unusual or allergic reaction to cyanocobalamin, cobalt, other medicines, foods, dyes, or preservatives -pregnant or trying to get pregnant -breast-feeding How should I use this medicine? This medicine is injected into a muscle or deeply under the skin. It is usually given by a health care professional in a clinic or doctor's office. However, your doctor may teach you how to inject yourself. Follow all instructions. Talk to your pediatrician regarding the use of this medicine in children. Special care may be needed. Overdosage: If you think you have taken too much of this medicine contact a poison control center or emergency room at once. NOTE: This medicine is only for you. Do not share this medicine with others. What if I miss a dose? If you are given your dose at a clinic or doctor's office, call to reschedule your appointment. If you give your own injections and you miss a dose, take it as soon as you can. If it is almost time for your next dose, take only that dose. Do not take double or extra doses. What may interact with this medicine? -colchicine -heavy alcohol intake This list may not describe all possible interactions. Give your health care provider a list of all the medicines, herbs, non-prescription drugs,  or dietary supplements you use. Also tell them if you smoke, drink alcohol, or use illegal drugs. Some items may interact with your medicine. What should I watch for while using this medicine? Visit your doctor or health care professional regularly. You may need blood work done while you are taking this medicine. You may need to follow a special diet. Talk to your doctor. Limit your alcohol intake and avoid smoking to get the best benefit. What side effects may I notice from receiving this medicine? Side effects that you should report to your doctor or health care professional as soon as possible: -allergic  reactions like skin rash, itching or hives, swelling of the face, lips, or tongue -blue tint to skin -chest tightness, pain -difficulty breathing, wheezing -dizziness -red, swollen painful area on the leg Side effects that usually do not require medical attention (report to your doctor or health care professional if they continue or are bothersome): -diarrhea -headache This list may not describe all possible side effects. Call your doctor for medical advice about side effects. You may report side effects to FDA at 1-800-FDA-1088. Where should I keep my medicine? Keep out of the reach of children. Store at room temperature between 15 and 30 degrees C (59 and 85 degrees F). Protect from light. Throw away any unused medicine after the expiration date. NOTE: This sheet is a summary. It may not cover all possible information. If you have questions about this medicine, talk to your doctor, pharmacist, or health care provider.  2012, Elsevier/Gold Standard. (11/29/2007 10:10:20 PM)

## 2013-02-04 ENCOUNTER — Encounter: Payer: Self-pay | Admitting: Oncology

## 2013-02-04 ENCOUNTER — Telehealth: Payer: Self-pay | Admitting: Family Medicine

## 2013-02-04 ENCOUNTER — Other Ambulatory Visit: Payer: Self-pay | Admitting: Family Medicine

## 2013-02-04 MED ORDER — PHENYTOIN SODIUM EXTENDED 100 MG PO CAPS: ORAL_CAPSULE | ORAL | Status: DC

## 2013-02-04 NOTE — Telephone Encounter (Signed)
Medication refilled per protocol. 

## 2013-02-04 NOTE — Progress Notes (Signed)
I called and spoke with patient to make sure ok to change address [address] [place] [address]. I advised him the [employer] was paid. He was very grateful and thanked me again. He said billing is sending him some papers to fill outVerlon West). I told him to make sure he sends back to her when he gets and fills out.

## 2013-02-11 NOTE — Addendum Note (Signed)
Addended by: Legrand Rams B on: 02/11/2013 09:17 AM   Modules accepted: Orders

## 2013-02-25 ENCOUNTER — Other Ambulatory Visit (INDEPENDENT_AMBULATORY_CARE_PROVIDER_SITE_OTHER): Payer: Medicare PPO

## 2013-02-25 ENCOUNTER — Other Ambulatory Visit: Payer: Self-pay | Admitting: Family Medicine

## 2013-02-25 DIAGNOSIS — G40909 Epilepsy, unspecified, not intractable, without status epilepticus: Secondary | ICD-10-CM

## 2013-02-25 NOTE — Telephone Encounter (Signed)
?  ok to refill °

## 2013-02-25 NOTE — Telephone Encounter (Signed)
Ok to refill 

## 2013-03-01 LAB — PHENYTOIN LEVEL, FREE AND TOTAL: Phenytoin, Total: 5.5 mg/L — ABNORMAL LOW (ref 10.0–20.0)

## 2013-03-02 ENCOUNTER — Other Ambulatory Visit: Payer: Self-pay | Admitting: Oncology

## 2013-03-02 DIAGNOSIS — D638 Anemia in other chronic diseases classified elsewhere: Secondary | ICD-10-CM

## 2013-03-03 ENCOUNTER — Ambulatory Visit (HOSPITAL_BASED_OUTPATIENT_CLINIC_OR_DEPARTMENT_OTHER): Payer: Medicare HMO

## 2013-03-03 ENCOUNTER — Other Ambulatory Visit (HOSPITAL_BASED_OUTPATIENT_CLINIC_OR_DEPARTMENT_OTHER): Payer: Medicare HMO | Admitting: Lab

## 2013-03-03 VITALS — BP 149/75 | HR 69 | Temp 97.5°F

## 2013-03-03 DIAGNOSIS — N183 Chronic kidney disease, stage 3 unspecified: Secondary | ICD-10-CM

## 2013-03-03 DIAGNOSIS — R569 Unspecified convulsions: Secondary | ICD-10-CM

## 2013-03-03 DIAGNOSIS — I10 Essential (primary) hypertension: Secondary | ICD-10-CM

## 2013-03-03 DIAGNOSIS — D638 Anemia in other chronic diseases classified elsewhere: Secondary | ICD-10-CM

## 2013-03-03 DIAGNOSIS — N039 Chronic nephritic syndrome with unspecified morphologic changes: Secondary | ICD-10-CM

## 2013-03-03 DIAGNOSIS — D518 Other vitamin B12 deficiency anemias: Secondary | ICD-10-CM

## 2013-03-03 DIAGNOSIS — M545 Low back pain, unspecified: Secondary | ICD-10-CM

## 2013-03-03 DIAGNOSIS — Z860100 Personal history of colon polyps, unspecified: Secondary | ICD-10-CM

## 2013-03-03 DIAGNOSIS — I739 Peripheral vascular disease, unspecified: Secondary | ICD-10-CM

## 2013-03-03 DIAGNOSIS — D649 Anemia, unspecified: Secondary | ICD-10-CM

## 2013-03-03 DIAGNOSIS — F1020 Alcohol dependence, uncomplicated: Secondary | ICD-10-CM

## 2013-03-03 DIAGNOSIS — G8929 Other chronic pain: Secondary | ICD-10-CM

## 2013-03-03 DIAGNOSIS — D519 Vitamin B12 deficiency anemia, unspecified: Secondary | ICD-10-CM

## 2013-03-03 LAB — CBC WITH DIFFERENTIAL/PLATELET
Eosinophils Absolute: 0 10*3/uL (ref 0.0–0.5)
HCT: 26.1 % — ABNORMAL LOW (ref 38.4–49.9)
LYMPH%: 20.1 % (ref 14.0–49.0)
MCHC: 33.3 g/dL (ref 32.0–36.0)
MCV: 104.4 fL — ABNORMAL HIGH (ref 79.3–98.0)
MONO#: 0.6 10*3/uL (ref 0.1–0.9)
MONO%: 10.5 % (ref 0.0–14.0)
NEUT%: 68.2 % (ref 39.0–75.0)
Platelets: 223 10*3/uL (ref 140–400)
WBC: 5.8 10*3/uL (ref 4.0–10.3)

## 2013-03-03 LAB — BASIC METABOLIC PANEL (CC13)
CO2: 15 mEq/L — ABNORMAL LOW (ref 22–29)
Calcium: 8.4 mg/dL (ref 8.4–10.4)
Creatinine: 2.7 mg/dL — ABNORMAL HIGH (ref 0.7–1.3)
Glucose: 98 mg/dl (ref 70–140)

## 2013-03-03 MED ORDER — DARBEPOETIN ALFA-POLYSORBATE 300 MCG/0.6ML IJ SOLN
300.0000 ug | Freq: Once | INTRAMUSCULAR | Status: AC
  Administered 2013-03-03: 300 ug via SUBCUTANEOUS
  Filled 2013-03-03: qty 0.6

## 2013-03-03 MED ORDER — CYANOCOBALAMIN 1000 MCG/ML IJ SOLN
1000.0000 ug | Freq: Once | INTRAMUSCULAR | Status: AC
  Administered 2013-03-03: 1000 ug via INTRAMUSCULAR

## 2013-03-07 ENCOUNTER — Other Ambulatory Visit: Payer: Self-pay | Admitting: Family Medicine

## 2013-03-07 NOTE — Telephone Encounter (Signed)
Med refilled.

## 2013-03-15 NOTE — Progress Notes (Signed)
LMTRC

## 2013-03-16 ENCOUNTER — Encounter: Payer: Self-pay | Admitting: Family Medicine

## 2013-03-21 DIAGNOSIS — E785 Hyperlipidemia, unspecified: Secondary | ICD-10-CM | POA: Insufficient documentation

## 2013-03-21 NOTE — Assessment & Plan Note (Addendum)
Is admitted to this facility for short term rehab; due to his continued hip pain will change his vicodin to 5/325 mg every 4 hours as needed; will continue his ultram 50 mg every 6 hours as needed and will continue lovenox 30 mg for 3 weeks

## 2013-03-21 NOTE — Assessment & Plan Note (Signed)
Will continue darepoetin 200 mcg monthly

## 2013-03-21 NOTE — Assessment & Plan Note (Signed)
No reports of seizure activity present will continue dilantin 200 mg daily and will monitor

## 2013-03-21 NOTE — Assessment & Plan Note (Signed)
Will continue lipitor 40 mg daily  

## 2013-03-21 NOTE — Progress Notes (Signed)
Patient ID: Larry West, male   DOB: 08-10-1926, 79 y.o.   MRN: [id number]  [facility]  Allergies  Allergen Reactions  . Darvocet (Propoxyphene-Acetaminophen) Nausea Only  . Penicillins Swelling    Unknown    . Sulfonamide Derivatives Itching     Chief Complaint  Patient presents with  . Hospitalization Follow-up    HPI: He has been hospitalized following fall in which he fractured his right hip. He is here for short term rehab at this time. He is not adequate pain relief at this time. He state he was getting his vicodin every 4 hours as needed while in the hospital and that this frequency worked better for him.   Past Medical History  Diagnosis Date  . Hypertension   . Chronic low back pain     right leg pain   . Hip pain     left  . Seizures 2006  . ETOHism   . Cataract   . CAD (coronary artery disease) 2001    s/p PTCI  . PVD (posterior vitreous detachment), left eye   . Anemia     CKD  . Colon polyps   . Chronic kidney disease (CKD), stage III (moderate)   . Hyperlipidemia   . Hyperkalemia   . Bone pain 09/16/2012  . Vitamin B12 deficiency anemia 10/21/2012    Past Surgical History  Procedure Laterality Date  . Cataracts surgery    . Appendectomy  1955  . Gastric outlet obstruction surgery  1988  . Lumbar disc surgery    . Femur im nail Right 11/11/2012    Procedure: INTRAMEDULLARY (IM) NAIL FEMORAL;  Surgeon: Kathryne Hitch, MD;  Location: [facility];  Service: Orthopedics;  Laterality: Right;    VITAL SIGNS BP 132/70  Pulse 70  Wt 147 lb (66.679 kg)  BMI 22.67 kg/m2   Patient's Medications  New Prescriptions   DIPHENOXYLATE-ATROPINE (LOMOTIL) 2.5-0.025 MG PER TABLET    TAKE TWO TABLETS BY MOUTH EVERY 6 HOURS AS NEEDED FOR DIARRHEA   HYDROCODONE-ACETAMINOPHEN (NORCO) 7.5-325 MG PER TABLET    TAKE ONE TABLET BY MOUTH EVERY 4 HOURS AS NEEDED FOR PAIN  Previous Medications   AMLODIPINE (NORVASC) 10 MG TABLET    Take 10 mg by mouth every morning.    ATORVASTATIN (LIPITOR) 40 MG TABLET    Take 40 mg by mouth at bedtime.    DARBEPOETIN (ARANESP) 200 MCG/0.4ML SOLN    Inject 200 mcg into the skin every 28 (twenty-eight) days. Due 11/11/12   FERROUS SULFATE 324 (65 FE) MG TBEC    Take by mouth daily.  Modified Medications   Modified Medication Previous Medication   CALCITRIOL (ROCALTROL) 0.25 MCG CAPSULE calcitRIOL (ROCALTROL) 0.25 MCG capsule      TAKE ONE CAPSULE BY MOUTH ONCE DAILY    TAKE ONE CAPSULE BY MOUTH EVERY DAY   CLOPIDOGREL (PLAVIX) 75 MG TABLET clopidogrel (PLAVIX) 75 MG tablet      TAKE ONE TABLET BY MOUTH ONCE DAILY    TAKE ONE TABLET BY MOUTH EVERY DAY   HYDROCODONE-ACETAMINOPHEN (NORCO) 7.5-325 MG PER TABLET HYDROcodone-acetaminophen (NORCO) 7.5-325 MG per tablet      Take 1 tablet by mouth every 4 (four) hours as needed for pain.    Take 1 tablet by mouth every 4 (four) hours as needed for pain.   HYDROCODONE-ACETAMINOPHEN (NORCO) 7.5-325 MG PER TABLET HYDROcodone-acetaminophen (NORCO) 7.5-325 MG per tablet      Take 1 tablet by mouth every 4 (four) hours  as needed for pain.    Take 1 tablet by mouth every 4 (four) hours as needed for pain.   MECLIZINE (ANTIVERT) 12.5 MG TABLET meclizine (ANTIVERT) 12.5 MG tablet      Take 1 tablet (12.5 mg total) by mouth 2 (two) times daily.    Take 1 tablet (12.5 mg total) by mouth 2 (two) times daily.   PHENYTOIN (DILANTIN) 100 MG ER CAPSULE phenytoin (DILANTIN) 100 MG ER capsule      3 capsule by mouth in the morning, 2 capsules by mouth in the evening    3 capsule by mouth in the morning 2 capsules by mouth in the evening  Discontinued Medications   ASPIRIN EC 325 MG TABLET    Take 1 tablet (325 mg total) by mouth daily.   CALCITRIOL (ROCALTROL) 0.25 MCG CAPSULE    Take 0.25 mcg by mouth every morning.    DOCUSATE SODIUM 100 MG CAPS    Take 100 mg by mouth 2 (two) times daily as needed for constipation.   ENOXAPARIN (LOVENOX) 30 MG/0.3ML INJECTION    Inject 0.3 mLs (30 mg total) into  the skin daily. For 3 weeks   HYDROCODONE-ACETAMINOPHEN (NORCO/VICODIN) 5-325 MG PER TABLET    Take 1-2 tablets by mouth every 4 (four) hours as needed.   HYDROCODONE-ACETAMINOPHEN (NORCO/VICODIN) 5-325 MG PER TABLET    Take 1 tablet every 6 hours as needed for moderate pain. Take 2 tablets every 6 hours as needed for moderate to severe pain   PHENYTOIN (DILANTIN) 100 MG ER CAPSULE    Take 2 capsules (200 mg total) by mouth daily.   POLYETHYLENE GLYCOL (MIRALAX / GLYCOLAX) PACKET    Take 17 g by mouth daily.   TRAMADOL (ULTRAM) 50 MG TABLET    Take 1 tablet (50 mg total) by mouth every 6 (six) hours as needed for pain.    SIGNIFICANT DIAGNOSTIC EXAMS      Component Value Date/Time   WBC 5.3 11/15/2012 0610   RBC 2.46* 11/15/2012 0610   HGB 8.0* 11/15/2012 0610   HCT 23.9* 11/15/2012 0610   PLT 156 11/15/2012 0610   MCV 97.2 11/15/2012 0610   Review of Systems  Constitutional: Negative for malaise/fatigue.  Respiratory: Negative for cough and shortness of breath.   Cardiovascular: Negative for chest pain and leg swelling.  Gastrointestinal: Negative for heartburn, abdominal pain and constipation.  Musculoskeletal: Positive for joint pain. Negative for myalgias.  Skin: Negative.   Neurological: Negative for headaches.  Psychiatric/Behavioral: Negative for depression.    Physical Exam  Constitutional: He appears well-developed and well-nourished.  thin  Neck: Neck supple. No JVD present. No thyromegaly present.  Cardiovascular: Normal rate, regular rhythm and intact distal pulses.   Respiratory: Effort normal and breath sounds normal. No respiratory distress.  GI: Soft. Bowel sounds are normal. He exhibits no distension. There is no tenderness.  Musculoskeletal: He exhibits no edema.  Is able to move extremities; is status post right hip fracture   Neurological: He is alert.  Skin: Skin is warm and dry.  Psychiatric: He has a normal mood and affect.      ASSESSMENT/ PLAN:  HTN  (hypertension) Is stable will continue norvasc 10 mg daily will continue asa 325 mg daily   Seizures No reports of seizure activity present will continue dilantin 200 mg daily and will monitor  Anemia of chronic disease Will continue darepoetin 200 mcg monthly   Closed right hip fracture Is admitted to this facility for short  term rehab; due to his continued hip pain will change his vicodin to 5/325 mg every 4 hours as needed; will continue his ultram 50 mg every 6 hours as needed and will continue lovenox 30 mg for 3 weeks   Other and unspecified hyperlipidemia Will continue lipitor 40 mg daily     Time spent with patient 50 minutes

## 2013-03-21 NOTE — Assessment & Plan Note (Signed)
Is stable will continue norvasc 10 mg daily will continue asa 325 mg daily

## 2013-03-28 ENCOUNTER — Other Ambulatory Visit: Payer: Self-pay | Admitting: Family Medicine

## 2013-03-28 ENCOUNTER — Telehealth: Payer: Self-pay | Admitting: Family Medicine

## 2013-03-28 NOTE — Telephone Encounter (Signed)
Ok to refill 

## 2013-03-28 NOTE — Telephone Encounter (Signed)
?   OK to Refill  

## 2013-03-29 NOTE — Telephone Encounter (Signed)
Both rx's called in

## 2013-03-30 ENCOUNTER — Other Ambulatory Visit: Payer: Self-pay | Admitting: *Deleted

## 2013-03-30 DIAGNOSIS — D638 Anemia in other chronic diseases classified elsewhere: Secondary | ICD-10-CM

## 2013-03-31 ENCOUNTER — Ambulatory Visit (HOSPITAL_BASED_OUTPATIENT_CLINIC_OR_DEPARTMENT_OTHER): Payer: Medicare HMO

## 2013-03-31 ENCOUNTER — Other Ambulatory Visit: Payer: Self-pay | Admitting: *Deleted

## 2013-03-31 ENCOUNTER — Other Ambulatory Visit (HOSPITAL_BASED_OUTPATIENT_CLINIC_OR_DEPARTMENT_OTHER): Payer: Medicare HMO | Admitting: Lab

## 2013-03-31 VITALS — BP 167/80 | HR 66 | Temp 97.7°F

## 2013-03-31 DIAGNOSIS — D519 Vitamin B12 deficiency anemia, unspecified: Secondary | ICD-10-CM

## 2013-03-31 DIAGNOSIS — D518 Other vitamin B12 deficiency anemias: Secondary | ICD-10-CM

## 2013-03-31 DIAGNOSIS — D631 Anemia in chronic kidney disease: Secondary | ICD-10-CM

## 2013-03-31 DIAGNOSIS — D638 Anemia in other chronic diseases classified elsewhere: Secondary | ICD-10-CM

## 2013-03-31 LAB — CBC WITH DIFFERENTIAL/PLATELET
BASO%: 1.2 % (ref 0.0–2.0)
EOS%: 1.6 % (ref 0.0–7.0)
MCH: 35.6 pg — ABNORMAL HIGH (ref 27.2–33.4)
MCHC: 33.1 g/dL (ref 32.0–36.0)
RBC: 2.38 10*6/uL — ABNORMAL LOW (ref 4.20–5.82)
RDW: 16.4 % — ABNORMAL HIGH (ref 11.0–14.6)
lymph#: 1.3 10*3/uL (ref 0.9–3.3)

## 2013-03-31 MED ORDER — CYANOCOBALAMIN 1000 MCG/ML IJ SOLN
1000.0000 ug | Freq: Once | INTRAMUSCULAR | Status: AC
  Administered 2013-03-31: 1000 ug via INTRAMUSCULAR

## 2013-03-31 MED ORDER — DARBEPOETIN ALFA-POLYSORBATE 300 MCG/0.6ML IJ SOLN
300.0000 ug | Freq: Once | INTRAMUSCULAR | Status: AC
  Administered 2013-03-31: 300 ug via SUBCUTANEOUS
  Filled 2013-03-31: qty 0.6

## 2013-03-31 NOTE — Telephone Encounter (Signed)
Was called in 03/28/13 °

## 2013-04-27 ENCOUNTER — Other Ambulatory Visit: Payer: Self-pay | Admitting: *Deleted

## 2013-04-27 DIAGNOSIS — D638 Anemia in other chronic diseases classified elsewhere: Secondary | ICD-10-CM

## 2013-04-28 ENCOUNTER — Other Ambulatory Visit: Payer: Self-pay | Admitting: Family Medicine

## 2013-04-28 ENCOUNTER — Other Ambulatory Visit: Payer: Medicare HMO | Admitting: Lab

## 2013-04-28 ENCOUNTER — Ambulatory Visit (HOSPITAL_BASED_OUTPATIENT_CLINIC_OR_DEPARTMENT_OTHER): Payer: Medicare HMO

## 2013-04-28 ENCOUNTER — Ambulatory Visit: Payer: Medicare HMO

## 2013-04-28 ENCOUNTER — Other Ambulatory Visit (HOSPITAL_BASED_OUTPATIENT_CLINIC_OR_DEPARTMENT_OTHER): Payer: Medicare HMO | Admitting: Lab

## 2013-04-28 ENCOUNTER — Ambulatory Visit: Payer: Medicare HMO | Admitting: Oncology

## 2013-04-28 VITALS — BP 162/74 | HR 67 | Temp 97.9°F

## 2013-04-28 DIAGNOSIS — D638 Anemia in other chronic diseases classified elsewhere: Secondary | ICD-10-CM

## 2013-04-28 DIAGNOSIS — D631 Anemia in chronic kidney disease: Secondary | ICD-10-CM

## 2013-04-28 DIAGNOSIS — D519 Vitamin B12 deficiency anemia, unspecified: Secondary | ICD-10-CM

## 2013-04-28 DIAGNOSIS — D539 Nutritional anemia, unspecified: Secondary | ICD-10-CM

## 2013-04-28 LAB — CBC WITH DIFFERENTIAL/PLATELET
Basophils Absolute: 0.1 10*3/uL (ref 0.0–0.1)
EOS%: 1.3 % (ref 0.0–7.0)
Eosinophils Absolute: 0.1 10*3/uL (ref 0.0–0.5)
HGB: 9.9 g/dL — ABNORMAL LOW (ref 13.0–17.1)
MCH: 36.4 pg — ABNORMAL HIGH (ref 27.2–33.4)
MONO#: 0.6 10*3/uL (ref 0.1–0.9)
NEUT#: 3.6 10*3/uL (ref 1.5–6.5)
RDW: 16.2 % — ABNORMAL HIGH (ref 11.0–14.6)
WBC: 5.5 10*3/uL (ref 4.0–10.3)
lymph#: 1.2 10*3/uL (ref 0.9–3.3)

## 2013-04-28 MED ORDER — DARBEPOETIN ALFA-POLYSORBATE 300 MCG/0.6ML IJ SOLN
300.0000 ug | Freq: Once | INTRAMUSCULAR | Status: AC
  Administered 2013-04-28: 300 ug via SUBCUTANEOUS
  Filled 2013-04-28: qty 0.6

## 2013-04-28 MED ORDER — CYANOCOBALAMIN 1000 MCG/ML IJ SOLN
1000.0000 ug | Freq: Once | INTRAMUSCULAR | Status: AC
  Administered 2013-04-28: 1000 ug via INTRAMUSCULAR

## 2013-04-28 NOTE — Telephone Encounter (Signed)
Ok but needs ov in 2 weeks for pill count and UDS (I do not want him to know).  He needs to bring his pill bottle to appointment.

## 2013-04-28 NOTE — Telephone Encounter (Signed)
?   OK to Refill and will call him back to set up appt in a few weeks to check his meds.

## 2013-04-29 ENCOUNTER — Telehealth: Payer: Self-pay | Admitting: *Deleted

## 2013-04-29 NOTE — Telephone Encounter (Signed)
Called pharmacy and they do have it from where I called in this am

## 2013-05-05 NOTE — Telephone Encounter (Signed)
LMTRC

## 2013-05-09 ENCOUNTER — Encounter: Payer: Self-pay | Admitting: Family Medicine

## 2013-05-09 ENCOUNTER — Ambulatory Visit (INDEPENDENT_AMBULATORY_CARE_PROVIDER_SITE_OTHER): Payer: Medicare PPO | Admitting: Family Medicine

## 2013-05-09 VITALS — BP 142/80 | HR 74 | Temp 97.8°F | Resp 18 | Wt 146.0 lb

## 2013-05-09 DIAGNOSIS — F1120 Opioid dependence, uncomplicated: Secondary | ICD-10-CM

## 2013-05-09 DIAGNOSIS — F1110 Opioid abuse, uncomplicated: Secondary | ICD-10-CM
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
## 2013-05-11 LAB — OPIATES/OPIOIDS (LC/MS-MS)
Hydromorphone: NEGATIVE ng/mL
Morphine Urine: NEGATIVE ng/mL
Noroxycodone, Ur: NEGATIVE ng/mL
Oxycodone, ur: NEGATIVE ng/mL

## 2013-05-12 LAB — PRESCRIPTION MONITORING PROFILE (13 PANEL)
Benzodiazepine Screen, Urine: NEGATIVE ng/mL
Methadone Screen, Urine: NEGATIVE ng/mL
Nitrites, Initial: NEGATIVE ug/mL
Propoxyphene: NEGATIVE ng/mL
pH, Initial: 5.8 pH (ref 4.5–8.9)

## 2013-05-18 ENCOUNTER — Encounter: Payer: Self-pay | Admitting: Family Medicine

## 2013-05-20 ENCOUNTER — Telehealth: Payer: Self-pay | Admitting: *Deleted

## 2013-05-20 NOTE — Telephone Encounter (Signed)
Lm gv appts for 05/26/13 w/labs@ 8:30am, ov@ 9am, and inj @ 9:45am...td

## 2013-05-25 ENCOUNTER — Other Ambulatory Visit: Payer: Self-pay | Admitting: Hematology and Oncology

## 2013-05-25 DIAGNOSIS — D638 Anemia in other chronic diseases classified elsewhere: Secondary | ICD-10-CM

## 2013-05-26 ENCOUNTER — Ambulatory Visit (HOSPITAL_BASED_OUTPATIENT_CLINIC_OR_DEPARTMENT_OTHER): Payer: Medicare HMO | Admitting: Hematology and Oncology

## 2013-05-26 ENCOUNTER — Encounter: Payer: Self-pay | Admitting: Hematology and Oncology

## 2013-05-26 ENCOUNTER — Other Ambulatory Visit (HOSPITAL_BASED_OUTPATIENT_CLINIC_OR_DEPARTMENT_OTHER): Payer: Medicare HMO | Admitting: Lab

## 2013-05-26 ENCOUNTER — Ambulatory Visit: Payer: Medicare HMO

## 2013-05-26 ENCOUNTER — Encounter: Payer: Self-pay | Admitting: *Deleted

## 2013-05-26 ENCOUNTER — Telehealth: Payer: Self-pay | Admitting: Hematology and Oncology

## 2013-05-26 VITALS — BP 145/79 | HR 72 | Temp 97.9°F | Resp 17 | Ht 67.0 in

## 2013-05-26 DIAGNOSIS — Z23 Encounter for immunization: Secondary | ICD-10-CM

## 2013-05-26 DIAGNOSIS — D638 Anemia in other chronic diseases classified elsewhere: Secondary | ICD-10-CM

## 2013-05-26 LAB — CBC & DIFF AND RETIC
BASO%: 0.4 % (ref 0.0–2.0)
Immature Retic Fract: 2.8 % — ABNORMAL LOW (ref 3.00–10.60)
MCHC: 33.7 g/dL (ref 32.0–36.0)
MONO#: 0.5 10*3/uL (ref 0.1–0.9)
RBC: 2.86 10*6/uL — ABNORMAL LOW (ref 4.20–5.82)
RDW: 15.2 % — ABNORMAL HIGH (ref 11.0–14.6)
Retic %: 0.67 % — ABNORMAL LOW (ref 0.80–1.80)
Retic Ct Abs: 19.16 10*3/uL — ABNORMAL LOW (ref 34.80–93.90)
WBC: 5.4 10*3/uL (ref 4.0–10.3)
lymph#: 1.2 10*3/uL (ref 0.9–3.3)

## 2013-05-26 LAB — COMPREHENSIVE METABOLIC PANEL (CC13)
ALT: 14 U/L (ref 0–55)
AST: 22 U/L (ref 5–34)
Albumin: 2.5 g/dL — ABNORMAL LOW (ref 3.5–5.0)
CO2: 13 mEq/L — ABNORMAL LOW (ref 22–29)
Calcium: 8.5 mg/dL (ref 8.4–10.4)
Chloride: 104 mEq/L (ref 98–109)
Creatinine: 3.2 mg/dL (ref 0.7–1.3)
Potassium: 4.8 mEq/L (ref 3.5–5.1)
Sodium: 131 mEq/L — ABNORMAL LOW (ref 136–145)
Total Protein: 7.2 g/dL (ref 6.4–8.3)

## 2013-05-26 LAB — LACTATE DEHYDROGENASE (CC13): LDH: 151 U/L (ref 125–245)

## 2013-05-26 MED ORDER — INFLUENZA VAC SPLIT QUAD 0.5 ML IM SUSP
0.5000 mL | INTRAMUSCULAR | Status: DC
  Filled 2013-05-26: qty 0.5

## 2013-05-26 MED ORDER — INFLUENZA VAC SPLIT QUAD 0.5 ML IM SUSP
0.5000 mL | INTRAMUSCULAR | Status: AC
  Administered 2013-05-26: 0.5 mL via INTRAMUSCULAR
  Filled 2013-05-26: qty 0.5
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
## 2013-05-26 NOTE — Telephone Encounter (Signed)
[facility] PT APPT SCHEDULE FOR October.  °

## 2013-05-26 NOTE — Patient Instructions (Signed)
Per Dr Bertis Ruddy, patient instructed to stop taking oral iron.  Also encouraged to increase fluid intake, drink more water, Creat is elevated.
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
## 2013-05-27 ENCOUNTER — Encounter: Payer: Self-pay | Admitting: *Deleted

## 2013-05-27 LAB — DIRECT ANTIGLOBULIN TEST (NOT AT ARMC)
DAT (Complement): NEGATIVE
DAT IgG: NEGATIVE

## 2013-05-27 LAB — VITAMIN B12: Vitamin B-12: 1339 pg/mL — ABNORMAL HIGH (ref 211–911)
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
## 2013-06-16 ENCOUNTER — Other Ambulatory Visit: Payer: Self-pay | Admitting: Family Medicine

## 2013-06-16 NOTE — Telephone Encounter (Signed)
Medication refilled per protocol. 

## 2013-06-17 ENCOUNTER — Telehealth: Payer: Self-pay | Admitting: Family Medicine

## 2013-06-17 MED ORDER — ATORVASTATIN CALCIUM 40 MG PO TABS: 40.0000 mg | ORAL_TABLET | Freq: Every day | ORAL | Status: AC

## 2013-06-17 NOTE — Telephone Encounter (Signed)
Lipitor 40 mg

## 2013-06-23 ENCOUNTER — Encounter: Payer: Self-pay | Admitting: *Deleted

## 2013-06-23 ENCOUNTER — Telehealth: Payer: Self-pay | Admitting: Hematology and Oncology

## 2013-06-23 ENCOUNTER — Ambulatory Visit (HOSPITAL_BASED_OUTPATIENT_CLINIC_OR_DEPARTMENT_OTHER): Payer: Medicare HMO

## 2013-06-23 ENCOUNTER — Other Ambulatory Visit (HOSPITAL_BASED_OUTPATIENT_CLINIC_OR_DEPARTMENT_OTHER): Payer: Medicare HMO | Admitting: Lab

## 2013-06-23 ENCOUNTER — Encounter: Payer: Self-pay | Admitting: Hematology and Oncology

## 2013-06-23 ENCOUNTER — Encounter (HOSPITAL_COMMUNITY)
Admission: RE | Admit: 2013-06-23 | Discharge: 2013-06-23 | Disposition: A | Discharge status: Home / Self Care | Payer: Medicare HMO | Source: Ambulatory Visit | Attending: Hematology and Oncology | Admitting: Hematology and Oncology

## 2013-06-23 ENCOUNTER — Telehealth: Payer: Self-pay | Admitting: *Deleted

## 2013-06-23 ENCOUNTER — Ambulatory Visit (HOSPITAL_BASED_OUTPATIENT_CLINIC_OR_DEPARTMENT_OTHER): Payer: Medicare HMO | Admitting: Hematology and Oncology

## 2013-06-23 VITALS — BP 131/70 | HR 78 | Temp 97.0°F | Resp 18 | Ht 67.0 in | Wt 146.5 lb

## 2013-06-23 DIAGNOSIS — D631 Anemia in chronic kidney disease: Secondary | ICD-10-CM

## 2013-06-23 DIAGNOSIS — N183 Chronic kidney disease, stage 3 unspecified: Secondary | ICD-10-CM

## 2013-06-23 DIAGNOSIS — D638 Anemia in other chronic diseases classified elsewhere: Secondary | ICD-10-CM

## 2013-06-23 DIAGNOSIS — D519 Vitamin B12 deficiency anemia, unspecified: Secondary | ICD-10-CM

## 2013-06-23 DIAGNOSIS — F1020 Alcohol dependence, uncomplicated: Secondary | ICD-10-CM

## 2013-06-23 LAB — CBC & DIFF AND RETIC
Basophils Absolute: 0.1 10*3/uL (ref 0.0–0.1)
EOS%: 1.1 % (ref 0.0–7.0)
Eosinophils Absolute: 0.1 10*3/uL (ref 0.0–0.5)
HCT: 23.4 % — ABNORMAL LOW (ref 38.4–49.9)
HGB: 7.8 g/dL — ABNORMAL LOW (ref 13.0–17.1)
Immature Retic Fract: 3 % (ref 3.00–10.60)
MCH: 34.5 pg — ABNORMAL HIGH (ref 27.2–33.4)
MCV: 103.5 fL — ABNORMAL HIGH (ref 79.3–98.0)
MONO%: 9.3 % (ref 0.0–14.0)
NEUT#: 3.7 10*3/uL (ref 1.5–6.5)
NEUT%: 66.3 % (ref 39.0–75.0)
RDW: 14.1 % (ref 11.0–14.6)
Retic %: 0.93 % (ref 0.80–1.80)
Retic Ct Abs: 21.02 10*3/uL — ABNORMAL LOW (ref 34.80–93.90)

## 2013-06-23 LAB — FERRITIN CHCC: Ferritin: 466 ng/ml — ABNORMAL HIGH (ref 22–316)

## 2013-06-23 MED ORDER — DARBEPOETIN ALFA-POLYSORBATE 300 MCG/0.6ML IJ SOLN
300.0000 ug | Freq: Once | INTRAMUSCULAR | Status: AC
  Administered 2013-06-23: 300 ug via SUBCUTANEOUS
  Filled 2013-06-23: qty 0.6
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
## 2013-06-23 NOTE — Telephone Encounter (Signed)
Per staff message and POF I have scheduled appts.  JMW  
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
## 2013-06-23 NOTE — Telephone Encounter (Signed)
gv and printed appt sched and avs for pt fro OCT and NOV...emailed  MW to add tx.

## 2013-06-24 ENCOUNTER — Ambulatory Visit (HOSPITAL_BASED_OUTPATIENT_CLINIC_OR_DEPARTMENT_OTHER): Payer: Medicare HMO

## 2013-06-24 ENCOUNTER — Encounter (INDEPENDENT_AMBULATORY_CARE_PROVIDER_SITE_OTHER): Payer: Self-pay

## 2013-06-24 VITALS — BP 161/79 | HR 57 | Temp 97.1°F | Resp 18

## 2013-06-24 DIAGNOSIS — D638 Anemia in other chronic diseases classified elsewhere: Secondary | ICD-10-CM

## 2013-06-24 LAB — PREPARE RBC (CROSSMATCH)

## 2013-06-24 MED ORDER — SODIUM CHLORIDE 0.9 % IV SOLN
250.0000 mL | Freq: Once | INTRAVENOUS | Status: AC
  Administered 2013-06-24: 250 mL via INTRAVENOUS

## 2013-06-24 MED ORDER — SODIUM CHLORIDE 0.9 % IJ SOLN
3.0000 mL | INTRAMUSCULAR | Status: DC | PRN
  Filled 2013-06-24: qty 10

## 2013-06-24 NOTE — Patient Instructions (Signed)
Blood Transfusion Information WHAT IS A BLOOD TRANSFUSION? A transfusion is the replacement of blood or some of its parts. Blood is made up of multiple cells which provide different functions.  Red blood cells carry oxygen and are used for blood loss replacement.  White blood cells fight against infection.  Platelets control bleeding.  Plasma helps clot blood.  Other blood products are available for specialized needs, such as hemophilia or other clotting disorders. BEFORE THE TRANSFUSION  Who gives blood for transfusions?   You may be able to donate blood to be used at a later date on yourself (autologous donation).  Relatives can be asked to donate blood. This is generally not any safer than if you have received blood from a stranger. The same precautions are taken to ensure safety when a relative's blood is donated.  Healthy volunteers who are fully evaluated to make sure their blood is safe. This is blood bank blood. Transfusion therapy is the safest it has ever been in the practice of medicine. Before blood is taken from a donor, a complete history is taken to make sure that person has no history of diseases nor engages in risky social behavior (examples are intravenous drug use or sexual activity with multiple partners). The donor's travel history is screened to minimize risk of transmitting infections, such as malaria. The donated blood is tested for signs of infectious diseases, such as HIV and hepatitis. The blood is then tested to be sure it is compatible with you in order to minimize the chance of a transfusion reaction. If you or a relative donates blood, this is often done in anticipation of surgery and is not appropriate for emergency situations. It takes many days to process the donated blood. RISKS AND COMPLICATIONS Although transfusion therapy is very safe and saves many lives, the main dangers of transfusion include:   Getting an infectious disease.  Developing a  transfusion reaction. This is an allergic reaction to something in the blood you were given. Every precaution is taken to prevent this. The decision to have a blood transfusion has been considered carefully by your caregiver before blood is given. Blood is not given unless the benefits outweigh the risks. AFTER THE TRANSFUSION  Right after receiving a blood transfusion, you will usually feel much better and more energetic. This is especially true if your red blood cells have gotten low (anemic). The transfusion raises the level of the red blood cells which carry oxygen, and this usually causes an energy increase.  The nurse administering the transfusion will monitor you carefully for complications. HOME CARE INSTRUCTIONS  No special instructions are needed after a transfusion. You may find your energy is better. Speak with your caregiver about any limitations on activity for underlying diseases you may have. SEEK MEDICAL CARE IF:   Your condition is not improving after your transfusion.  You develop redness or irritation at the intravenous (IV) site. SEEK IMMEDIATE MEDICAL CARE IF:  Any of the following symptoms occur over the next 12 hours:  Shaking chills.  You have a temperature by mouth above 102 F (38.9 C), not controlled by medicine.  Chest, back, or muscle pain.  People around you feel you are not acting correctly or are confused.  Shortness of breath or difficulty breathing.  Dizziness and fainting.  You get a rash or develop hives.  You have a decrease in urine output.  Your urine turns a dark color or changes to pink, red, or brown. Any of the following   symptoms occur over the next 10 days:  You have a temperature by mouth above 102 F (38.9 C), not controlled by medicine.  Shortness of breath.  Weakness after normal activity.  The white part of the eye turns yellow (jaundice).  You have a decrease in the amount of urine or are urinating less often.  Your  urine turns a dark color or changes to pink, red, or brown. Document Released: 08/15/2000 Document Revised: 11/10/2011 Document Reviewed: 04/03/2008 Exit[employer] Patient Information 2014 [employer].  

## 2013-06-27 LAB — TYPE AND SCREEN
ABO/RH(D): O NEG
Unit division: 0
Unit division: 0

## 2013-07-06 ENCOUNTER — Ambulatory Visit (HOSPITAL_COMMUNITY)
Admission: RE | Admit: 2013-07-06 | Discharge: 2013-07-06 | Disposition: A | Discharge status: Home / Self Care | Payer: Medicare HMO | Source: Ambulatory Visit | Attending: Hematology and Oncology | Admitting: Hematology and Oncology

## 2013-07-13 ENCOUNTER — Other Ambulatory Visit: Payer: Self-pay | Admitting: *Deleted

## 2013-07-13 ENCOUNTER — Other Ambulatory Visit: Payer: Self-pay | Admitting: Oncology

## 2013-07-14 ENCOUNTER — Encounter (INDEPENDENT_AMBULATORY_CARE_PROVIDER_SITE_OTHER): Payer: Self-pay

## 2013-07-14 ENCOUNTER — Ambulatory Visit (HOSPITAL_BASED_OUTPATIENT_CLINIC_OR_DEPARTMENT_OTHER): Payer: Medicare HMO | Admitting: Hematology and Oncology

## 2013-07-14 ENCOUNTER — Other Ambulatory Visit: Payer: Self-pay | Admitting: Hematology and Oncology

## 2013-07-14 ENCOUNTER — Ambulatory Visit: Payer: Medicare HMO

## 2013-07-14 ENCOUNTER — Telehealth: Payer: Self-pay | Admitting: Hematology and Oncology

## 2013-07-14 ENCOUNTER — Telehealth: Payer: Self-pay | Admitting: *Deleted

## 2013-07-14 ENCOUNTER — Other Ambulatory Visit (HOSPITAL_BASED_OUTPATIENT_CLINIC_OR_DEPARTMENT_OTHER): Payer: Medicare HMO | Admitting: Lab

## 2013-07-14 ENCOUNTER — Encounter: Payer: Self-pay | Admitting: Hematology and Oncology

## 2013-07-14 VITALS — BP 152/98 | HR 61 | Temp 96.6°F | Resp 18 | Ht 67.0 in

## 2013-07-14 DIAGNOSIS — G8929 Other chronic pain: Secondary | ICD-10-CM

## 2013-07-14 DIAGNOSIS — D638 Anemia in other chronic diseases classified elsewhere: Secondary | ICD-10-CM

## 2013-07-14 DIAGNOSIS — Z86010 Personal history of colonic polyps: Secondary | ICD-10-CM

## 2013-07-14 DIAGNOSIS — N400 Enlarged prostate without lower urinary tract symptoms: Secondary | ICD-10-CM | POA: Insufficient documentation

## 2013-07-14 DIAGNOSIS — D631 Anemia in chronic kidney disease: Secondary | ICD-10-CM

## 2013-07-14 DIAGNOSIS — I10 Essential (primary) hypertension: Secondary | ICD-10-CM

## 2013-07-14 DIAGNOSIS — R351 Nocturia: Secondary | ICD-10-CM

## 2013-07-14 DIAGNOSIS — F1020 Alcohol dependence, uncomplicated: Secondary | ICD-10-CM

## 2013-07-14 DIAGNOSIS — D649 Anemia, unspecified: Secondary | ICD-10-CM

## 2013-07-14 DIAGNOSIS — I739 Peripheral vascular disease, unspecified: Secondary | ICD-10-CM

## 2013-07-14 DIAGNOSIS — F1021 Alcohol dependence, in remission: Secondary | ICD-10-CM

## 2013-07-14 DIAGNOSIS — R569 Unspecified convulsions: Secondary | ICD-10-CM

## 2013-07-14 HISTORY — DX: Benign prostatic hyperplasia without lower urinary tract symptoms: N40.0

## 2013-07-14 LAB — CBC & DIFF AND RETIC
BASO%: 0.5 % (ref 0.0–2.0)
Basophils Absolute: 0 10*3/uL (ref 0.0–0.1)
EOS%: 2 % (ref 0.0–7.0)
Eosinophils Absolute: 0.1 10*3/uL (ref 0.0–0.5)
HCT: 29.1 % — ABNORMAL LOW (ref 38.4–49.9)
HGB: 9.6 g/dL — ABNORMAL LOW (ref 13.0–17.1)
MCH: 34.5 pg — ABNORMAL HIGH (ref 27.2–33.4)
MCV: 104.7 fL — ABNORMAL HIGH (ref 79.3–98.0)
MONO%: 11.5 % (ref 0.0–14.0)
NEUT#: 2.6 10*3/uL (ref 1.5–6.5)
NEUT%: 57.8 % (ref 39.0–75.0)
RDW: 18.6 % — ABNORMAL HIGH (ref 11.0–14.6)
Retic %: 0.94 % (ref 0.80–1.80)
Retic Ct Abs: 26.13 10*3/uL — ABNORMAL LOW (ref 34.80–93.90)
nRBC: 0 % (ref 0–0)

## 2013-07-14 LAB — COMPREHENSIVE METABOLIC PANEL (CC13)
ALT: 10 U/L (ref 0–55)
AST: 13 U/L (ref 5–34)
Alkaline Phosphatase: 123 U/L (ref 40–150)
BUN: 93.9 mg/dL — ABNORMAL HIGH (ref 7.0–26.0)
Creatinine: 3.8 mg/dL (ref 0.7–1.3)
Sodium: 137 mEq/L (ref 136–145)
Total Bilirubin: 0.26 mg/dL (ref 0.20–1.20)

## 2013-07-14 LAB — HOLD TUBE, BLOOD BANK

## 2013-07-14 MED ORDER — DARBEPOETIN ALFA-POLYSORBATE 300 MCG/0.6ML IJ SOLN
300.0000 ug | INTRAMUSCULAR | Status: DC
  Administered 2013-07-14: 300 ug via SUBCUTANEOUS
  Filled 2013-07-14: qty 0.6

## 2013-07-14 NOTE — Progress Notes (Signed)
Patient injection given on exam side.

## 2013-07-14 NOTE — Telephone Encounter (Signed)
appts made AVS and cal printed shh

## 2013-07-14 NOTE — Telephone Encounter (Signed)
Called Dr. Caren Macadam office at ph [phone] and s/w nurse, Martie Lee.  Notified of elevated K+ and Creatinine on today's labs.  Asked them to f/u w/ pt and manage as this is not related to reason pt being seen here for his anemia.  Faxed over labs and today's office note to Dr. Tanya Nones at fax [phone].    Sabrina verbalized understanding.

## 2013-07-14 NOTE — Progress Notes (Signed)
[facility] OFFICE PROGRESS NOTE  West,Larry [name], MD DIAGNOSIS:  Chronic anemia  SUMMARY OF HEMATOLOGIC HISTORY: This is an elderly 79 year old gentleman who was initially referred you for evaluation of chronic anemia, on background history chronic kidney disease. He also has history of B12 deficiency and had been getting B12 injections in the past along with erythropoietin stimulating agents. In January 2014, bone marrow aspirate and biopsy did not show evidence of myelodysplastic syndrome. In October 2014, this patient has significant alcohol intake. He received 2 units of blood transfusion due to severe anemia  INTERVAL HISTORY: WARDEN BUFFA 79 y.o. male returns for further followup. The patient denies any recent signs or symptoms of bleeding such as spontaneous epistaxis, hematuria or hematochezia. He has more energy since the blood transfusion. The patient is currently living in a safe environment. The patient complained of frequent urination at nighttime. Denies any urinary urgency, dysuria or frequency I have reviewed the past medical history, past surgical history, social history and family history with the patient and they are unchanged from previous note.  ALLERGIES:  is allergic to darvocet; penicillins; and sulfonamide derivatives.  MEDICATIONS:  Current Outpatient Prescriptions  Medication Sig Dispense Refill  . amLODipine (NORVASC) 10 MG tablet TAKE ONE TABLET BY MOUTH EVERY DAY  30 tablet  5  . atorvastatin (LIPITOR) 40 MG tablet Take 1 tablet (40 mg total) by mouth at bedtime.  30 tablet  3  . calcitRIOL (ROCALTROL) 0.25 MCG capsule TAKE ONE CAPSULE BY MOUTH ONCE DAILY  30 capsule  4  . clopidogrel (PLAVIX) 75 MG tablet TAKE ONE TABLET BY MOUTH ONCE DAILY  30 tablet  2  . diphenoxylate-atropine (LOMOTIL) 2.5-0.025 MG per tablet TAKE TWO TABLETS BY MOUTH EVERY 6 HOURS AS NEEDED FOR DIARRHEA  30 tablet  0  . meclizine (ANTIVERT) 12.5 MG tablet Take 1  tablet (12.5 mg total) by mouth 2 (two) times daily.  30 tablet    . phenytoin (DILANTIN) 100 MG ER capsule TAKE THREE CAPSULES BY MOUTH IN THE MORNING AND TWO IN THE EVENING  150 capsule  2   Current Facility-Administered Medications  Medication Dose Route Frequency Provider Last Rate Last Dose  . darbepoetin (ARANESP) injection 300 mcg  300 mcg Subcutaneous UD Artis Delay, MD   300 mcg at 07/14/13 1610     REVIEW OF SYSTEMS:   Constitutional: Denies fevers, chills or night sweats All other systems were reviewed with the patient and are negative.  PHYSICAL EXAMINATION: ECOG PERFORMANCE STATUS: 1 - Symptomatic but completely ambulatory  Filed Vitals:   07/14/13 0831  BP: 152/98  Pulse: 61  Temp: 96.6 F (35.9 C)  Resp: 18   There were no vitals filed for this visit.  GENERAL:alert, no distress and comfortable. He looks thin SKIN: skin color, texture, turgor are normal, no rashes or significant lesions EYES: normal, Conjunctiva are pink and non-injected, sclera clear OROPHARYNX:no exudate, no erythema and lips, buccal mucosa, and tongue normal . Noted poor dentition NECK: supple, thyroid normal size, non-tender, without nodularity LYMPH:  no palpable lymphadenopathy in the cervical, axillary or inguinal LUNGS: clear to auscultation and percussion with normal breathing effort HEART: regular rate & rhythm and no murmurs and no lower extremity edema ABDOMEN:abdomen soft, non-tender and normal bowel sounds Musculoskeletal:no cyanosis of digits and no clubbing  NEURO: alert & oriented x 3 with fluent speech, no focal motor/sensory deficits  LABORATORY DATA:  I have reviewed the data as listed Results for orders placed  in visit on 07/14/13 (from the past 48 hour(s))  CBC & DIFF AND RETIC     Status: Abnormal   Collection Time    07/14/13  8:06 AM      Result Value Range   WBC 4.4  4.0 - 10.3 10e3/uL   NEUT# 2.6  1.5 - 6.5 10e3/uL   HGB 9.6 (*) 13.0 - 17.1 g/dL   HCT 46.9 (*)  62.9 - 49.9 %   Platelets 210  140 - 400 10e3/uL   MCV 104.7 (*) 79.3 - 98.0 fL   MCH 34.5 (*) 27.2 - 33.4 pg   MCHC 33.0  32.0 - 36.0 g/dL   RBC 5.28 (*) 4.13 - 2.44 10e6/uL   RDW 18.6 (*) 11.0 - 14.6 %   lymph# 1.3  0.9 - 3.3 10e3/uL   MONO# 0.5  0.1 - 0.9 10e3/uL   Eosinophils Absolute 0.1  0.0 - 0.5 10e3/uL   Basophils Absolute 0.0  0.0 - 0.1 10e3/uL   NEUT% 57.8  39.0 - 75.0 %   LYMPH% 28.2  14.0 - 49.0 %   MONO% 11.5  0.0 - 14.0 %   EOS% 2.0  0.0 - 7.0 %   BASO% 0.5  0.0 - 2.0 %   nRBC 0  0 - 0 %   Retic % 0.94  0.80 - 1.80 %   Retic Ct Abs 26.13 (*) 34.80 - 93.90 10e3/uL   Immature Retic Fract 5.30  3.00 - 10.60 %  COMPREHENSIVE METABOLIC PANEL (CC13)     Status: Abnormal   Collection Time    07/14/13  8:06 AM      Result Value Range   Sodium 137  136 - 145 mEq/L   Potassium 5.5 (*) 3.5 - 5.1 mEq/L   Chloride 114 (*) 98 - 109 mEq/L   CO2 11 (*) 22 - 29 mEq/L   Glucose 90  70 - 140 mg/dl   BUN 01.0 (*) 7.0 - 27.2 mg/dL   Creatinine 3.8 (*) 0.7 - 1.3 mg/dL   Total Bilirubin 5.36  0.20 - 1.20 mg/dL   Alkaline Phosphatase 123  40 - 150 U/L   AST 13  5 - 34 U/L   ALT 10  0 - 55 U/L   Total Protein 7.2  6.4 - 8.3 g/dL   Albumin 2.9 (*) 3.5 - 5.0 g/dL   Calcium 8.4  8.4 - 64.4 mg/dL   Anion Gap 12 (*) 3 - 11 mEq/L    ASSESSMENT:  Chronic anemia  PLAN:  #1 chronic anemia This is a combination of anemia chronic disease and history of alcoholism. He had a bone marrow aspirate and biopsy done in January 2014 show no evidence of myelodysplastic syndrome. Previously had history of B12 deficiency and the most recent B12 level is elevated. I recommend we resume erythropoietin stimulating agents but to give it more frequently every other week in the hope that we can avoid further blood transfusion. He agrees. #2 chronic kidney disease Assessment stable. I warned the patient erythropoietin stimulating agents sometimes can cause elevated blood pressure. We will monitor that  carefully #3 hypertension The patient has been taking his blood pressure medications at home. Is mildly elevated today likely due to white coat hypertension. We will monitor with that carefully #4 increased nocturia Given his age and anemia, I will check a PSA to make sure that this is not due to prostate cancer. He is in agreement. #5 history of alcoholism The patient has not drank  alcohol for over a month. I congratulated his effort. All questions were answered. The patient knows to call the clinic with any problems, questions or concerns. No barriers to learning was detected.  I spent 25 minutes counseling the patient face to face. The total time spent in the appointment was 40 minutes and more than 50% was on counseling.     Kody Vigil, MD 07/14/2013 9:11 AM

## 2013-07-15 ENCOUNTER — Other Ambulatory Visit: Payer: Self-pay | Admitting: Family Medicine

## 2013-07-15 LAB — ERYTHROPOIETIN: Erythropoietin: 37.8 m[IU]/mL — ABNORMAL HIGH (ref 2.6–18.5)

## 2013-07-29 ENCOUNTER — Other Ambulatory Visit (HOSPITAL_BASED_OUTPATIENT_CLINIC_OR_DEPARTMENT_OTHER): Payer: Medicare HMO | Admitting: Lab

## 2013-07-29 ENCOUNTER — Ambulatory Visit (HOSPITAL_BASED_OUTPATIENT_CLINIC_OR_DEPARTMENT_OTHER): Payer: Medicare HMO

## 2013-07-29 VITALS — BP 152/80 | HR 63 | Temp 98.1°F

## 2013-07-29 DIAGNOSIS — I10 Essential (primary) hypertension: Secondary | ICD-10-CM

## 2013-07-29 DIAGNOSIS — D649 Anemia, unspecified: Secondary | ICD-10-CM

## 2013-07-29 DIAGNOSIS — M545 Low back pain, unspecified: Secondary | ICD-10-CM

## 2013-07-29 DIAGNOSIS — D631 Anemia in chronic kidney disease: Secondary | ICD-10-CM

## 2013-07-29 DIAGNOSIS — F1020 Alcohol dependence, uncomplicated: Secondary | ICD-10-CM

## 2013-07-29 DIAGNOSIS — Z860100 Personal history of colon polyps, unspecified: Secondary | ICD-10-CM

## 2013-07-29 DIAGNOSIS — N183 Chronic kidney disease, stage 3 unspecified: Secondary | ICD-10-CM

## 2013-07-29 DIAGNOSIS — N400 Enlarged prostate without lower urinary tract symptoms: Secondary | ICD-10-CM

## 2013-07-29 DIAGNOSIS — D638 Anemia in other chronic diseases classified elsewhere: Secondary | ICD-10-CM

## 2013-07-29 DIAGNOSIS — G8929 Other chronic pain: Secondary | ICD-10-CM

## 2013-07-29 DIAGNOSIS — R569 Unspecified convulsions: Secondary | ICD-10-CM

## 2013-07-29 DIAGNOSIS — I739 Peripheral vascular disease, unspecified: Secondary | ICD-10-CM

## 2013-07-29 LAB — CBC & DIFF AND RETIC
BASO%: 0.7 % (ref 0.0–2.0)
EOS%: 2.2 % (ref 0.0–7.0)
Eosinophils Absolute: 0.1 10*3/uL (ref 0.0–0.5)
HCT: 30.2 % — ABNORMAL LOW (ref 38.4–49.9)
HGB: 9.9 g/dL — ABNORMAL LOW (ref 13.0–17.1)
Immature Retic Fract: 9.7 % (ref 3.00–10.60)
LYMPH%: 21.5 % (ref 14.0–49.0)
MCH: 34.9 pg — ABNORMAL HIGH (ref 27.2–33.4)
MCHC: 32.8 g/dL (ref 32.0–36.0)
MCV: 106.3 fL — ABNORMAL HIGH (ref 79.3–98.0)
MONO#: 0.5 10*3/uL (ref 0.1–0.9)
NEUT#: 2.9 10*3/uL (ref 1.5–6.5)
NEUT%: 64.2 % (ref 39.0–75.0)
Platelets: 202 10*3/uL (ref 140–400)
RDW: 19.2 % — ABNORMAL HIGH (ref 11.0–14.6)
Retic Ct Abs: 66.17 10*3/uL (ref 34.80–93.90)
WBC: 4.5 10*3/uL (ref 4.0–10.3)
lymph#: 1 10*3/uL (ref 0.9–3.3)

## 2013-07-29 LAB — HOLD TUBE, BLOOD BANK

## 2013-07-29 MED ORDER — DARBEPOETIN ALFA-POLYSORBATE 300 MCG/0.6ML IJ SOLN
300.0000 ug | INTRAMUSCULAR | Status: DC
  Administered 2013-07-29: 300 ug via SUBCUTANEOUS
  Filled 2013-07-29: qty 0.6

## 2013-07-29 NOTE — Patient Instructions (Signed)
Darbepoetin Alfa injection What is this medicine? DARBEPOETIN ALFA (dar be POE e tin AL fa) helps your body make more red blood cells. It is used to treat anemia caused by chronic kidney failure and chemotherapy. This medicine may be used for other purposes; ask your health care provider or pharmacist if you have questions. COMMON BRAND NAME(S): Aranesp What should I tell my health care provider before I take this medicine? They need to know if you have any of these conditions: -blood clotting disorders or history of blood clots -cancer patient not on chemotherapy -cystic fibrosis -heart disease, such as angina, heart failure, or a history of a heart attack -hemoglobin level of 12 g/dL or greater -high blood pressure -low levels of folate, iron, or vitamin B12 -seizures -an unusual or allergic reaction to darbepoetin, erythropoietin, albumin, hamster proteins, latex, other medicines, foods, dyes, or preservatives -pregnant or trying to get pregnant -breast-feeding How should I use this medicine? This medicine is for injection into a vein or under the skin. It is usually given by a health care professional in a hospital or clinic setting. If you get this medicine at home, you will be taught how to prepare and give this medicine. Do not shake the solution before you withdraw a dose. Use exactly as directed. Take your medicine at regular intervals. Do not take your medicine more often than directed. It is important that you put your used needles and syringes in a special sharps container. Do not put them in a trash can. If you do not have a sharps container, call your pharmacist or healthcare provider to get one. Talk to your pediatrician regarding the use of this medicine in children. While this medicine may be used in children as young as 1 year for selected conditions, precautions do apply. Overdosage: If you think you have taken too much of this medicine contact a poison control center or  emergency room at once. NOTE: This medicine is only for you. Do not share this medicine with others. What if I miss a dose? If you miss a dose, take it as soon as you can. If it is almost time for your next dose, take only that dose. Do not take double or extra doses. What may interact with this medicine? Do not take this medicine with any of the following medications: -epoetin alfa This list may not describe all possible interactions. Give your health care provider a list of all the medicines, herbs, non-prescription drugs, or dietary supplements you use. Also tell them if you smoke, drink alcohol, or use illegal drugs. Some items may interact with your medicine. What should I watch for while using this medicine? Visit your prescriber or health care professional for regular checks on your progress and for the needed blood tests and blood pressure measurements. It is especially important for the doctor to make sure your hemoglobin level is in the desired range, to limit the risk of potential side effects and to give you the best benefit. Keep all appointments for any recommended tests. Check your blood pressure as directed. Ask your doctor what your blood pressure should be and when you should contact him or her. As your body makes more red blood cells, you may need to take iron, folic acid, or vitamin B supplements. Ask your doctor or health care provider which products are right for you. If you have kidney disease continue dietary restrictions, even though this medication can make you feel better. Talk with your doctor or health   care professional about the foods you eat and the vitamins that you take. What side effects may I notice from receiving this medicine? Side effects that you should report to your doctor or health care professional as soon as possible: -allergic reactions like skin rash, itching or hives, swelling of the face, lips, or tongue -breathing problems -changes in vision -chest  pain -confusion, trouble speaking or understanding -feeling faint or lightheaded, falls -high blood pressure -muscle aches or pains -pain, swelling, warmth in the leg -rapid weight gain -severe headaches -sudden numbness or weakness of the face, arm or leg -trouble walking, dizziness, loss of balance or coordination -seizures (convulsions) -swelling of the ankles, feet, hands -unusually weak or tired Side effects that usually do not require medical attention (report to your doctor or health care professional if they continue or are bothersome): -diarrhea -fever, chills (flu-like symptoms) -headaches -nausea, vomiting -redness, stinging, or swelling at site where injected This list may not describe all possible side effects. Call your doctor for medical advice about side effects. You may report side effects to FDA at 1-800-FDA-1088. Where should I keep my medicine? Keep out of the reach of children. Store in a refrigerator between 2 and 8 degrees C (36 and 46 degrees F). Do not freeze. Do not shake. Throw away any unused portion if using a single-dose vial. Throw away any unused medicine after the expiration date. NOTE: This sheet is a summary. It may not cover all possible information. If you have questions about this medicine, talk to your doctor, pharmacist, or health care provider.  2014, Elsevier/Gold Standard. (2008-08-01 10:23:57)  

## 2013-08-11 ENCOUNTER — Ambulatory Visit: Payer: Medicare HMO

## 2013-08-11 ENCOUNTER — Other Ambulatory Visit (HOSPITAL_BASED_OUTPATIENT_CLINIC_OR_DEPARTMENT_OTHER): Payer: Medicare HMO

## 2013-08-11 DIAGNOSIS — G8929 Other chronic pain: Secondary | ICD-10-CM

## 2013-08-11 DIAGNOSIS — Z86010 Personal history of colonic polyps: Secondary | ICD-10-CM

## 2013-08-11 DIAGNOSIS — F1020 Alcohol dependence, uncomplicated: Secondary | ICD-10-CM

## 2013-08-11 DIAGNOSIS — I10 Essential (primary) hypertension: Secondary | ICD-10-CM

## 2013-08-11 DIAGNOSIS — D649 Anemia, unspecified: Secondary | ICD-10-CM

## 2013-08-11 DIAGNOSIS — R569 Unspecified convulsions: Secondary | ICD-10-CM

## 2013-08-11 DIAGNOSIS — I739 Peripheral vascular disease, unspecified: Secondary | ICD-10-CM

## 2013-08-11 DIAGNOSIS — D638 Anemia in other chronic diseases classified elsewhere: Secondary | ICD-10-CM

## 2013-08-11 LAB — CBC & DIFF AND RETIC
BASO%: 0.6 % (ref 0.0–2.0)
EOS%: 2.2 % (ref 0.0–7.0)
Eosinophils Absolute: 0.1 10*3/uL (ref 0.0–0.5)
HCT: 32.6 % — ABNORMAL LOW (ref 38.4–49.9)
LYMPH%: 24.1 % (ref 14.0–49.0)
MCH: 35.2 pg — ABNORMAL HIGH (ref 27.2–33.4)
MCHC: 32.8 g/dL (ref 32.0–36.0)
MCV: 107.2 fL — ABNORMAL HIGH (ref 79.3–98.0)
MONO%: 11.5 % (ref 0.0–14.0)
NEUT#: 3 10*3/uL (ref 1.5–6.5)
Platelets: 225 10*3/uL (ref 140–400)
RBC: 3.04 10*6/uL — ABNORMAL LOW (ref 4.20–5.82)
RDW: 18.4 % — ABNORMAL HIGH (ref 11.0–14.6)
Retic %: 2.4 % — ABNORMAL HIGH (ref 0.80–1.80)

## 2013-08-11 MED ORDER — DARBEPOETIN ALFA-POLYSORBATE 300 MCG/0.6ML IJ SOLN
300.0000 ug | INTRAMUSCULAR | Status: DC
  Filled 2013-08-11: qty 0.6

## 2013-08-11 NOTE — Progress Notes (Signed)
HGB 10.7 today.  Will not receive Aranesp today as per Dr Bertis Ruddy.

## 2013-08-21 ENCOUNTER — Emergency Department (HOSPITAL_COMMUNITY): Payer: Medicare HMO

## 2013-08-21 ENCOUNTER — Encounter (HOSPITAL_COMMUNITY): Payer: Self-pay | Admitting: Emergency Medicine

## 2013-08-21 ENCOUNTER — Inpatient Hospital Stay (HOSPITAL_COMMUNITY): Payer: Medicare HMO

## 2013-08-21 ENCOUNTER — Inpatient Hospital Stay (HOSPITAL_COMMUNITY)
Admission: EM | Admit: 2013-08-21 | Discharge: 2013-09-01 | DRG: 871 | Disposition: E | Payer: Medicare HMO | Attending: Internal Medicine | Admitting: Internal Medicine

## 2013-08-21 DIAGNOSIS — N183 Chronic kidney disease, stage 3 unspecified: Secondary | ICD-10-CM | POA: Diagnosis present

## 2013-08-21 DIAGNOSIS — Z515 Encounter for palliative care: Secondary | ICD-10-CM

## 2013-08-21 DIAGNOSIS — R0600 Dyspnea, unspecified: Secondary | ICD-10-CM

## 2013-08-21 DIAGNOSIS — E872 Acidosis, unspecified: Secondary | ICD-10-CM | POA: Diagnosis present

## 2013-08-21 DIAGNOSIS — N179 Acute kidney failure, unspecified: Secondary | ICD-10-CM | POA: Diagnosis present

## 2013-08-21 DIAGNOSIS — G40909 Epilepsy, unspecified, not intractable, without status epilepticus: Secondary | ICD-10-CM | POA: Diagnosis present

## 2013-08-21 DIAGNOSIS — I129 Hypertensive chronic kidney disease with stage 1 through stage 4 chronic kidney disease, or unspecified chronic kidney disease: Secondary | ICD-10-CM | POA: Diagnosis present

## 2013-08-21 DIAGNOSIS — D62 Acute posthemorrhagic anemia: Secondary | ICD-10-CM

## 2013-08-21 DIAGNOSIS — D631 Anemia in chronic kidney disease: Secondary | ICD-10-CM | POA: Diagnosis present

## 2013-08-21 DIAGNOSIS — M898X9 Other specified disorders of bone, unspecified site: Secondary | ICD-10-CM

## 2013-08-21 DIAGNOSIS — D638 Anemia in other chronic diseases classified elsewhere: Secondary | ICD-10-CM | POA: Diagnosis present

## 2013-08-21 DIAGNOSIS — J9600 Acute respiratory failure, unspecified whether with hypoxia or hypercapnia: Secondary | ICD-10-CM | POA: Diagnosis present

## 2013-08-21 DIAGNOSIS — I10 Essential (primary) hypertension: Secondary | ICD-10-CM | POA: Diagnosis present

## 2013-08-21 DIAGNOSIS — I739 Peripheral vascular disease, unspecified: Secondary | ICD-10-CM | POA: Diagnosis present

## 2013-08-21 DIAGNOSIS — E875 Hyperkalemia: Secondary | ICD-10-CM | POA: Diagnosis present

## 2013-08-21 DIAGNOSIS — Z79899 Other long term (current) drug therapy: Secondary | ICD-10-CM

## 2013-08-21 DIAGNOSIS — M545 Low back pain, unspecified: Secondary | ICD-10-CM | POA: Diagnosis present

## 2013-08-21 DIAGNOSIS — M899 Disorder of bone, unspecified: Secondary | ICD-10-CM

## 2013-08-21 DIAGNOSIS — J189 Pneumonia, unspecified organism: Secondary | ICD-10-CM | POA: Diagnosis present

## 2013-08-21 DIAGNOSIS — I2510 Atherosclerotic heart disease of native coronary artery without angina pectoris: Secondary | ICD-10-CM | POA: Diagnosis present

## 2013-08-21 DIAGNOSIS — G8929 Other chronic pain: Secondary | ICD-10-CM | POA: Diagnosis present

## 2013-08-21 DIAGNOSIS — J9601 Acute respiratory failure with hypoxia: Secondary | ICD-10-CM

## 2013-08-21 DIAGNOSIS — F1020 Alcohol dependence, uncomplicated: Secondary | ICD-10-CM | POA: Diagnosis present

## 2013-08-21 DIAGNOSIS — E785 Hyperlipidemia, unspecified: Secondary | ICD-10-CM | POA: Diagnosis present

## 2013-08-21 DIAGNOSIS — F411 Generalized anxiety disorder: Secondary | ICD-10-CM | POA: Diagnosis present

## 2013-08-21 DIAGNOSIS — Z66 Do not resuscitate: Secondary | ICD-10-CM | POA: Diagnosis present

## 2013-08-21 DIAGNOSIS — E46 Unspecified protein-calorie malnutrition: Secondary | ICD-10-CM | POA: Diagnosis present

## 2013-08-21 DIAGNOSIS — A419 Sepsis, unspecified organism: Principal | ICD-10-CM | POA: Diagnosis present

## 2013-08-21 LAB — CBC WITH DIFFERENTIAL/PLATELET
Basophils Absolute: 0 10*3/uL (ref 0.0–0.1)
Basophils Relative: 0 % (ref 0–1)
Eosinophils Absolute: 0 10*3/uL (ref 0.0–0.7)
Eosinophils Relative: 0 % (ref 0–5)
HCT: 33.8 % — ABNORMAL LOW (ref 39.0–52.0)
Lymphocytes Relative: 5 % — ABNORMAL LOW (ref 12–46)
MCH: 36.1 pg — ABNORMAL HIGH (ref 26.0–34.0)
MCHC: 34.6 g/dL (ref 30.0–36.0)
MCV: 104.3 fL — ABNORMAL HIGH (ref 78.0–100.0)
Monocytes Absolute: 0.5 10*3/uL (ref 0.1–1.0)
RDW: 15.9 % — ABNORMAL HIGH (ref 11.5–15.5)
WBC: 13 10*3/uL — ABNORMAL HIGH (ref 4.0–10.5)

## 2013-08-21 LAB — POCT I-STAT 3, ART BLOOD GAS (G3+)
Acid-base deficit: 25 mmol/L — ABNORMAL HIGH (ref 0.0–2.0)
pCO2 arterial: 10.1 mmHg — CL (ref 35.0–45.0)
pH, Arterial: 7.052 — CL (ref 7.350–7.450)
pO2, Arterial: 136 mmHg — ABNORMAL HIGH (ref 80.0–100.0)

## 2013-08-21 LAB — COMPREHENSIVE METABOLIC PANEL
AST: 36 U/L (ref 0–37)
Albumin: 3.1 g/dL — ABNORMAL LOW (ref 3.5–5.2)
Alkaline Phosphatase: 224 U/L — ABNORMAL HIGH (ref 39–117)
CO2: <7 mEq/L — CL (ref 19–32)
Calcium: 6.8 mg/dL — ABNORMAL LOW (ref 8.4–10.5)
Creatinine, Ser: 9.62 mg/dL — ABNORMAL HIGH (ref 0.50–1.35)
GFR calc Af Amer: 5 mL/min — ABNORMAL LOW (ref >90)
GFR calc non Af Amer: 4 mL/min — ABNORMAL LOW (ref >90)
Glucose, Bld: 132 mg/dL — ABNORMAL HIGH (ref 70–99)
Potassium: 6.9 mEq/L (ref 3.5–5.1)
Total Protein: 8.2 g/dL (ref 6.0–8.3)

## 2013-08-21 LAB — PROTIME-INR
INR: 1.68 — ABNORMAL HIGH (ref 0.00–1.49)
Prothrombin Time: 19.3 seconds — ABNORMAL HIGH (ref 11.6–15.2)

## 2013-08-21 LAB — ETHANOL: Alcohol, Ethyl (B): <11 mg/dL (ref 0–11)

## 2013-08-21 LAB — BASIC METABOLIC PANEL
Chloride: 101 mEq/L (ref 96–112)
Creatinine, Ser: 9.42 mg/dL — ABNORMAL HIGH (ref 0.50–1.35)
GFR calc Af Amer: 5 mL/min — ABNORMAL LOW (ref >90)
Glucose, Bld: 112 mg/dL — ABNORMAL HIGH (ref 70–99)
Potassium: 5.3 mEq/L — ABNORMAL HIGH (ref 3.5–5.1)
Sodium: 135 mEq/L (ref 135–145)

## 2013-08-21 LAB — CG4 I-STAT (LACTIC ACID): Lactic Acid, Venous: 1.04 mmol/L (ref 0.5–2.2)

## 2013-08-21 LAB — PHENYTOIN LEVEL, TOTAL: Phenytoin Lvl: 8.5 ug/mL — ABNORMAL LOW (ref 10.0–20.0)

## 2013-08-21 MED ORDER — THIAMINE HCL 100 MG/ML IJ SOLN
100.0000 mg | Freq: Once | INTRAMUSCULAR | Status: AC
  Administered 2013-08-21: 100 mg via INTRAVENOUS
  Filled 2013-08-21: qty 2

## 2013-08-21 MED ORDER — SODIUM CHLORIDE 0.9 % IV SOLN
1.0000 g | Freq: Once | INTRAVENOUS | Status: AC
  Administered 2013-08-21: 1 g via INTRAVENOUS
  Filled 2013-08-21 (×2): qty 10

## 2013-08-21 MED ORDER — ALBUTEROL SULFATE (5 MG/ML) 0.5% IN NEBU
10.0000 mg | INHALATION_SOLUTION | Freq: Once | RESPIRATORY_TRACT | Status: DC
  Filled 2013-08-21: qty 2

## 2013-08-21 MED ORDER — INSULIN ASPART 100 UNIT/ML IV SOLN
10.0000 [IU] | Freq: Once | INTRAVENOUS | Status: DC
  Filled 2013-08-21: qty 0.1

## 2013-08-21 MED ORDER — SCOPOLAMINE 1 MG/3DAYS TD PT72
1.0000 | MEDICATED_PATCH | TRANSDERMAL | Status: DC
  Administered 2013-08-22: 1.5 mg via TRANSDERMAL
  Filled 2013-08-21 (×2): qty 1

## 2013-08-21 MED ORDER — LORAZEPAM 2 MG/ML IJ SOLN
1.0000 mg | Freq: Four times a day (QID) | INTRAMUSCULAR | Status: DC | PRN
  Filled 2013-08-21: qty 1

## 2013-08-21 MED ORDER — CALCIUM GLUCONATE 10 % IV SOLN
1.0000 g | Freq: Once | INTRAVENOUS | Status: DC
  Filled 2013-08-21: qty 10

## 2013-08-21 MED ORDER — MORPHINE SULFATE 2 MG/ML IJ SOLN
2.0000 mg | INTRAMUSCULAR | Status: DC | PRN
  Administered 2013-08-21 (×2): 2 mg via INTRAVENOUS
  Filled 2013-08-21 (×2): qty 1

## 2013-08-21 MED ORDER — ALBUTEROL (5 MG/ML) CONTINUOUS INHALATION SOLN
10.0000 mg | INHALATION_SOLUTION | RESPIRATORY_TRACT | Status: DC
  Administered 2013-08-21: 10 mg via RESPIRATORY_TRACT
  Filled 2013-08-21: qty 20

## 2013-08-21 MED ORDER — ADULT MULTIVITAMIN W/MINERALS CH
1.0000 | ORAL_TABLET | Freq: Every day | ORAL | Status: DC
  Filled 2013-08-21: qty 1

## 2013-08-21 MED ORDER — LORAZEPAM 2 MG/ML IJ SOLN
0.5000 mg | INTRAMUSCULAR | Status: DC | PRN
  Administered 2013-08-21 – 2013-08-24 (×5): 1 mg via INTRAVENOUS
  Filled 2013-08-21 (×4): qty 1

## 2013-08-21 MED ORDER — LORAZEPAM 1 MG PO TABS: 1.0000 mg | ORAL_TABLET | Freq: Four times a day (QID) | ORAL | Status: DC | PRN

## 2013-08-21 MED ORDER — SODIUM POLYSTYRENE SULFONATE 15 GM/60ML PO SUSP
30.0000 g | Freq: Once | ORAL | Status: AC
  Administered 2013-08-21: 30 g via ORAL
  Filled 2013-08-21: qty 120

## 2013-08-21 MED ORDER — INSULIN ASPART 100 UNIT/ML ~~LOC~~ SOLN
10.0000 [IU] | SUBCUTANEOUS | Status: AC
  Administered 2013-08-21: 10 [IU] via INTRAVENOUS
  Filled 2013-08-21: qty 1

## 2013-08-21 MED ORDER — VITAMIN B-1 100 MG PO TABS
100.0000 mg | ORAL_TABLET | Freq: Every day | ORAL | Status: DC
  Filled 2013-08-21: qty 1

## 2013-08-21 MED ORDER — MORPHINE SULFATE 2 MG/ML IJ SOLN: 1.0000 mg | INTRAMUSCULAR | Status: DC | PRN

## 2013-08-21 MED ORDER — SODIUM CHLORIDE 0.9 % IV SOLN
2.0000 mg/h | INTRAVENOUS | Status: DC
  Administered 2013-08-21: 0.5 mg/h via INTRAVENOUS
  Administered 2013-08-23 (×2): 2 mg/h via INTRAVENOUS
  Filled 2013-08-21 (×3): qty 2.5

## 2013-08-21 MED ORDER — THIAMINE HCL 100 MG/ML IJ SOLN
100.0000 mg | Freq: Every day | INTRAMUSCULAR | Status: DC
  Filled 2013-08-21: qty 1

## 2013-08-21 MED ORDER — SODIUM BICARBONATE 8.4 % IV SOLN
INTRAVENOUS | Status: DC
  Administered 2013-08-21: 19:00:00 via INTRAVENOUS
  Filled 2013-08-21 (×4): qty 150

## 2013-08-21 MED ORDER — FOLIC ACID 1 MG PO TABS
1.0000 mg | ORAL_TABLET | Freq: Every day | ORAL | Status: DC
  Filled 2013-08-21: qty 1

## 2013-08-21 MED ORDER — SODIUM BICARBONATE 8.4 % IV SOLN
50.0000 meq | Freq: Once | INTRAVENOUS | Status: AC
  Administered 2013-08-21: 50 meq via INTRAVENOUS
  Filled 2013-08-21 (×2): qty 50

## 2013-08-21 MED ORDER — DEXTROSE 50 % IV SOLN
1.0000 | Freq: Once | INTRAVENOUS | Status: AC
  Administered 2013-08-21: 50 mL via INTRAVENOUS
  Filled 2013-08-21: qty 50

## 2013-08-21 MED ORDER — SODIUM CHLORIDE 0.9 % IV SOLN
INTRAVENOUS | Status: DC
  Administered 2013-08-21: 18:00:00 via INTRAVENOUS
  Administered 2013-08-22: 20 mL/h via INTRAVENOUS
  Administered 2013-08-23: 14:00:00 via INTRAVENOUS

## 2013-08-21 MED ORDER — PANTOPRAZOLE SODIUM 40 MG IV SOLR
40.0000 mg | Freq: Every day | INTRAVENOUS | Status: DC
  Administered 2013-08-21: 40 mg via INTRAVENOUS
  Filled 2013-08-21 (×2): qty 40

## 2013-08-21 NOTE — ED Notes (Signed)
Patient returned from Radiology. 

## 2013-08-21 NOTE — ED Notes (Signed)
Critical Values reported to Dr. Blinda Leatherwood. K 6.9; CO2 <7.

## 2013-08-21 NOTE — ED Notes (Signed)
Son, Chayim, Bialas leaving at this time. Phone #[phone]

## 2013-08-21 NOTE — ED Notes (Signed)
cbg 119 

## 2013-08-21 NOTE — ED Provider Notes (Addendum)
CSN: [id number]     Arrival date & time 08/09/2013  1328 History   First MD Initiated Contact with Patient 08/31/2013 1332     Chief Complaint  Patient presents with  . Shortness of Breath   (Consider location/radiation/quality/duration/timing/severity/associated sxs/prior Treatment) HPI Comments: Patient brought to the emergency department by ambulance with multiple complaints. Patient has been complaining of pain all over, predominately in the shoulders. This is a chronic pain. He has been having increased anxiety and panic attacks over the past several days. Patient reportedly called his son this morning and when he went to see he noticed that he was confused and having difficulty breathing.  This very confused and agitated on arrival. He has not answered questions appropriately and cannot provide any further information. Level V Caveat due to confusion.  Patient is a 79 y.o. male presenting with shortness of breath.  Shortness of Breath   Past Medical History  Diagnosis Date  . Hypertension   . Chronic low back pain     right leg pain   . Hip pain     left  . Seizures 2006  . ETOHism   . Cataract   . CAD (coronary artery disease) 2001    s/p PTCI  . PVD (posterior vitreous detachment), left eye   . Anemia     CKD  . Colon polyps   . Chronic kidney disease (CKD), stage III (moderate)   . Hyperlipidemia   . Hyperkalemia   . Bone pain 09/16/2012  . Vitamin B12 deficiency anemia 10/21/2012  . Prostatism 07/14/2013   Past Surgical History  Procedure Laterality Date  . Cataracts surgery    . Appendectomy  1955  . Gastric outlet obstruction surgery  1988  . Lumbar disc surgery    . Femur im nail Right 11/11/2012    Procedure: INTRAMEDULLARY (IM) NAIL FEMORAL;  Surgeon: Kathryne Hitch, MD;  Location: [facility];  Service: Orthopedics;  Laterality: Right;   Family History  Problem Relation Age of Onset  . Cancer Son 55    colon cancer   History  Substance Use Topics  .  Smoking status: Never Smoker   . Smokeless tobacco: Never Used  . Alcohol Use: 4.8 oz/week    8 Shots of liquor per week     Comment: patient reports he drinks 1/2 pint of gin from the bottle each night    Review of Systems  Unable to perform ROS: Mental status change  Respiratory: Positive for shortness of breath.     Allergies  Darvocet; Penicillins; and Sulfonamide derivatives  Home Medications   Current Outpatient Rx  Name  Route  Sig  Dispense  Refill  . amLODipine (NORVASC) 10 MG tablet      TAKE ONE TABLET BY MOUTH EVERY DAY   30 tablet   5   . atorvastatin (LIPITOR) 40 MG tablet   Oral   Take 1 tablet (40 mg total) by mouth at bedtime.   30 tablet   3   . calcitRIOL (ROCALTROL) 0.25 MCG capsule      TAKE ONE CAPSULE BY MOUTH ONCE DAILY   30 capsule   3   . clopidogrel (PLAVIX) 75 MG tablet      TAKE ONE TABLET BY MOUTH ONCE DAILY   30 tablet   2   . diphenoxylate-atropine (LOMOTIL) 2.5-0.025 MG per tablet      TAKE TWO TABLETS BY MOUTH EVERY 6 HOURS AS NEEDED FOR DIARRHEA   30 tablet  0     Pt to dc this medication   . meclizine (ANTIVERT) 12.5 MG tablet   Oral   Take 1 tablet (12.5 mg total) by mouth 2 (two) times daily.   30 tablet      . phenytoin (DILANTIN) 100 MG ER capsule      TAKE THREE CAPSULES BY MOUTH IN THE MORNING AND TWO IN THE EVENING   150 capsule   2    There were no vitals taken for this visit. Physical Exam  Constitutional: He appears well-developed and well-nourished. He appears distressed.  HENT:  Head: Normocephalic and atraumatic.  Right Ear: Hearing normal.  Left Ear: Hearing normal.  Nose: Nose normal.  Mouth/Throat: Oropharynx is clear and moist and mucous membranes are normal.  Eyes: Conjunctivae and EOM are normal. Pupils are equal, round, and reactive to light.  Neck: Normal range of motion. Neck supple.  Cardiovascular: Regular rhythm, S1 normal and S2 normal.  Exam reveals no gallop and no friction rub.    No murmur heard. Pulmonary/Chest: Accessory muscle usage present. Tachypnea noted. No respiratory distress. He has decreased breath sounds. He exhibits no tenderness.  Abdominal: Soft. Normal appearance and bowel sounds are normal. There is no hepatosplenomegaly. There is no tenderness. There is no rebound, no guarding, no tenderness at McBurney's point and negative Murphy's sign. No hernia.  Musculoskeletal: Normal range of motion.  Neurological: He is alert. He has normal strength. He is disoriented. No cranial nerve deficit or sensory deficit. Coordination normal. GCS eye subscore is 4. GCS verbal subscore is 5. GCS motor subscore is 6.  Skin: Skin is warm, dry and intact. No rash noted. No cyanosis.  Psychiatric: His speech is normal and behavior is normal. Thought content normal. His mood appears anxious.    ED Course  Procedures (including critical care time)  CRITICAL CARE Performed by: Gilda Crease.   Total critical care time:  Critical care time was exclusive of separately billable procedures and treating other patients.  Critical care was necessary to treat or prevent imminent or life-threatening deterioration.  Critical care was time spent personally by me on the following activities: development of treatment plan with patient and/or surrogate as well as nursing, discussions with consultants, evaluation of patient's response to treatment, examination of patient, obtaining history from patient or surrogate, ordering and performing treatments and interventions, ordering and review of laboratory studies, ordering and review of radiographic studies, pulse oximetry and re-evaluation of patient's condition.   Labs Review Labs Reviewed  CBC WITH DIFFERENTIAL - Abnormal; Notable for the following:    WBC 13.0 (*)    RBC 3.24 (*)    Hemoglobin 11.7 (*)    HCT 33.8 (*)    MCV 104.3 (*)    MCH 36.1 (*)    RDW 15.9 (*)    Neutrophils Relative % 91 (*)    Neutro  Abs 11.8 (*)    Lymphocytes Relative 5 (*)    All other components within normal limits  COMPREHENSIVE METABOLIC PANEL - Abnormal; Notable for the following:    Sodium 129 (*)    Potassium 6.9 (*)    Chloride 94 (*)    CO2 <7 (*)    Glucose, Bld 132 (*)    BUN 193 (*)    Creatinine, Ser 9.62 (*)    Calcium 6.8 (*)    Albumin 3.1 (*)    Alkaline Phosphatase 224 (*)    GFR calc non Af Amer 4 (*)  GFR calc Af Amer 5 (*)    All other components within normal limits  PROTIME-INR - Abnormal; Notable for the following:    Prothrombin Time 19.3 (*)    INR 1.68 (*)    All other components within normal limits  PRO B NATRIURETIC PEPTIDE - Abnormal; Notable for the following:    Pro B Natriuretic peptide (BNP) 33918.0 (*)    All other components within normal limits  PHENYTOIN LEVEL, TOTAL - Abnormal; Notable for the following:    Phenytoin Lvl 8.5 (*)    All other components within normal limits  POCT I-STAT 3, BLOOD GAS (G3+) - Abnormal; Notable for the following:    pH, Arterial 7.052 (*)    pCO2 arterial 10.1 (*)    pO2, Arterial 136.0 (*)    Bicarbonate 2.8 (*)    Acid-base deficit 25.0 (*)    All other components within normal limits  TROPONIN I  AMMONIA  ETHANOL  URINALYSIS, ROUTINE W REFLEX MICROSCOPIC  CG4 I-STAT (LACTIC ACID)   Imaging Review Dg Chest 2 View  09/20/2013   CLINICAL DATA:  Cough, week, shortness of Breath  EXAM: CHEST - 2 VIEW  COMPARISON:  11/11/2012  FINDINGS: Patchy subsegmental atelectasis versus early infiltrate in the posteromedial right lower lobe, obscuring a portion of the diaphragmatic leaflet on the lateral radiograph. Left lung clear. Heart size upper limits normal. Mildly tortuous atheromatous aorta. Mild spondylitic changes in the lower thoracolumbar spine. Advanced degenerative changes in bilateral shoulders.  IMPRESSION: 1. Patchy infiltrate or subsegmental atelectasis in the posteromedial right lower lobe.   Electronically Signed   By:  Oley Balm M.D.   On: 2013/09/20 15:08   Ct Head Wo Contrast  20-Sep-2013   CLINICAL DATA:  Confusion.  EXAM: CT HEAD WITHOUT CONTRAST  TECHNIQUE: Contiguous axial images were obtained from the base of the skull through the vertex without intravenous contrast.  COMPARISON:  11/11/2012  FINDINGS: There is atrophy and chronic small vessel disease changes. No acute intracranial abnormality. Specifically, no hemorrhage, hydrocephalus, mass lesion, acute infarction, or significant intracranial injury. No acute calvarial abnormality. Visualized paranasal sinuses and mastoids clear. Orbital soft tissues unremarkable.  IMPRESSION: No acute intracranial abnormality.  Atrophy, chronic microvascular disease.   Electronically Signed   By: Charlett Nose M.D.   On: Sep 20, 2013 15:04    EKG Interpretation    Date/Time:  Sunday 2013/09/20 13:40:54 EST Ventricular Rate:  85 PR Interval:    QRS Duration: 110 QT Interval:  386 QTC Calculation: 459 R Axis:   14 Text Interpretation:  Sinus rhythm Abnormal R-wave progression, early transition Otherwise no significant change Confirmed by Adriana Quinby  MD, Mckinleigh Schuchart ([id number]) on 09/20/2013 2:27:27 PM            MDM  Diagnosis: 1. Acute renal failure 2. Hyperkalemia 3. Metabolic Gap Acidosis 4. Uremia  Patient brought to the ER for evaluation of shortness of breath and generalized pain with mental status changes. Patient does have a history of alcoholism. Encephalopathy secondary to alcoholism and possibly liver disease was considered. Acute infection and other etiology also considered.  Patient did exhibit tachypnea at arrival, but appears very anxious and agitated. Blood gas shows metabolic acidosis. Chest x-ray raises the question of possible patchy infiltrate in the posterior medial right middle lobe. Patient is not hypoxic.  Head CT was performed because of the confusion. No acute cranial abnormality was seen.  Blood work reveals acute renal  failure. He has chronic renal insufficiency with a  baseline creatinine in the range of 3. Today's creatinine is above 9. Uremia is likely the cause of patient's gap acidosis and confusion. His exhibiting hyperkalemia with a potassium of 6.9. He does have some peaking of his T waves, but this has been seen previously on EKGs as well. Hyperkalemia treated with calcium, bicarbonate, insulin, albuterol and Kayexalate. Renal consult placed, Dr. Arlean Hopping will evaluate.  Doctor Rai, on call for hospitalist contacted for admission. After reviewing the case, she was concerned about the acuity of the situation, as per critical care to evaluate the patient for possible admission. Doctor Zubelivitsky, contacted, will see the patient in the emergency department.   Addendum: Doctor Tyson Alias has evaluated the patient here in the ER. He has discussed the patient's CODE STATUS with the family and the patient is DO NOT RESUSCITATE, no CODE BLUE, no dialysis, no escalation of care. He is written for orders for management of the patient's hyperkalemia and palliation, will reconsult internal medicine.  Gilda Crease, MD 08/09/2013 1621  Gilda Crease, MD 08/31/2013 1644  Gilda Crease, MD 08/29/2013 1740

## 2013-08-21 NOTE — ED Notes (Signed)
Family states more confusion and anxiety for past four days. Patient reports chronic pain in shoulders and he just wants to be left alone and to go see mama and daddy. Tachypneic and speaking in broken sentences. 100% on room air.

## 2013-08-21 NOTE — H&P (Signed)
[facility] History and Physical  JAHAAN VANWAGNER ZOX:[id number] DOB: 30-Nov-1925 DOA: 08/16/2013  Referring physician:  PCP: Leo Grosser, MD  Specialists:   Chief Complaint: Shortness of breath  HPI: Larry West is a 79 y.o. male  With a history of hypertension, alcoholism, seizures, was brought in to the emergency department via EMS for confusion as well as shortness of breath. It seems over the past few days patient has been complaining of pain all over, as well as anxiety and panic attacks. His son called him this morning and noticed that his father is having some confusion with difficulty breathing, at which point EMS was called. Patient was found be very confused and agitated upon arrival. Upon arrival to emergency department, patient was found to have end organ damage with a creatinine of 9.62, respiratory distress, metabolic acidosis. PCCM was called initially however it was decided upon discussion with the family as well as the patient to make the patient comfort care.  Review of Systems:  Constitutional: Denies fever, chills, diaphoresis, appetite change and fatigue.  HEENT: Denies photophobia, eye pain, redness, hearing loss, ear pain, congestion, sore throat, rhinorrhea, sneezing, mouth sores, trouble swallowing, neck pain, neck stiffness and tinnitus.   Respiratory: Complains of SOB.   Cardiovascular: Denies chest pain, palpitations and leg swelling.  Gastrointestinal: Denies nausea, vomiting, abdominal pain, diarrhea, constipation, blood in stool and abdominal distention.  Genitourinary: Denies dysuria, urgency, frequency, hematuria, flank pain and difficulty urinating.  Musculoskeletal: Complains of back pain  Skin: Denies pallor, rash and wound.  Neurological: Denies dizziness, seizures, syncope, weakness, light-headedness, numbness and headaches.  Hematological: Denies adenopathy. Easy bruising, personal or family bleeding history  Psychiatric/Behavioral: Admits  to panic attacks and anxiety.   Past Medical History  Diagnosis Date  . Hypertension   . Chronic low back pain     right leg pain   . Hip pain     left  . Seizures 2006  . ETOHism   . Cataract   . CAD (coronary artery disease) 2001    s/p PTCI  . PVD (posterior vitreous detachment), left eye   . Anemia     CKD  . Colon polyps   . Chronic kidney disease (CKD), stage III (moderate)   . Hyperlipidemia   . Hyperkalemia   . Bone pain 09/16/2012  . Vitamin B12 deficiency anemia 10/21/2012  . Prostatism 07/14/2013   Past Surgical History  Procedure Laterality Date  . Cataracts surgery    . Appendectomy  1955  . Gastric outlet obstruction surgery  1988  . Lumbar disc surgery    . Femur im nail Right 11/11/2012    Procedure: INTRAMEDULLARY (IM) NAIL FEMORAL;  Surgeon: Kathryne Hitch, MD;  Location: [facility];  Service: Orthopedics;  Laterality: Right;   Social History:  reports that he has never smoked. He has never used smokeless tobacco. He reports that he drinks about 4.8 ounces of alcohol per week. He reports that he does not use illicit drugs. Lives at home alone.  Allergies  Allergen Reactions  . Darvocet [Propoxyphene-Acetaminophen] Nausea Only  . Penicillins Swelling    Unknown    . Sulfonamide Derivatives Itching    Family History  Problem Relation Age of Onset  . Cancer Son 57    colon cancer    Prior to Admission medications   Medication Sig Start Date End Date Taking? Authorizing Provider  amLODipine (NORVASC) 10 MG tablet Take 10 mg by mouth daily.   Yes Historical Provider,  MD  atorvastatin (LIPITOR) 40 MG tablet Take 1 tablet (40 mg total) by mouth at bedtime. 06/17/13  Yes Donita Brooks, MD  calcitRIOL (ROCALTROL) 0.25 MCG capsule Take 0.25 mcg by mouth daily.   Yes Historical Provider, MD  clopidogrel (PLAVIX) 75 MG tablet Take 75 mg by mouth daily with breakfast.   Yes Historical Provider, MD  diphenoxylate-atropine (LOMOTIL) 2.5-0.025 MG per  tablet Take 2 tablets by mouth every 6 (six) hours as needed for diarrhea or loose stools.   Yes Historical Provider, MD  ferrous sulfate 325 (65 FE) MG EC tablet Take 325 mg by mouth daily with breakfast.   Yes Historical Provider, MD  phenytoin (DILANTIN) 100 MG ER capsule Take 200-300 mg by mouth 2 (two) times daily. Take 300mg  in the AM and 200mg  in the PM   Yes Historical Provider, MD   Physical Exam: Filed Vitals:   08/09/2013 1724  BP: 134/107  Pulse: 97  Resp: 25     General: Well developed, well nourished, moderate distress, appears stated age  HEENT: NCAT, PERRLA, EOMI, Anicteic Sclera, mucous membranes dry, poor dentition. No pharyngeal erythema or exudates  Neck: Supple, no JVD, no masses  Cardiovascular: S1 S2 auscultated, no rubs, murmurs or gallops. Regular rate and rhythm.  Respiratory: Diminished breath sounds particularly at the bases, tachypneic  Abdomen: Soft, nontender, nondistended, + bowel sounds  Extremities: warm dry without cyanosis clubbing or edema  Neuro: AAOx3, cranial nerves grossly intact.   Skin: Without rashes exudates or nodules  Psych: Normal affect and demeanor   Labs on Admission:  Basic Metabolic Panel:  Recent Labs Lab 08/19/2013 1340  NA 129*  K 6.9*  CL 94*  CO2 <7*  GLUCOSE 132*  BUN 193*  CREATININE 9.62*  CALCIUM 6.8*   Liver Function Tests:  Recent Labs Lab 08/19/2013 1340  AST 36  ALT 29  ALKPHOS 224*  BILITOT 0.3  PROT 8.2  ALBUMIN 3.1*   No results found for this basename: LIPASE, AMYLASE,  in the last 168 hours  Recent Labs Lab 08/01/2013 1340  AMMONIA 35   CBC:  Recent Labs Lab 08/01/2013 1340  WBC 13.0*  NEUTROABS 11.8*  HGB 11.7*  HCT 33.8*  MCV 104.3*  PLT 178   Cardiac Enzymes:  Recent Labs Lab 08/12/2013 1340  TROPONINI <0.30    BNP (last 3 results)  Recent Labs  08/29/2013 1340  PROBNP 33918.0*   CBG: No results found for this basename: GLUCAP,  in the last 168  hours  Radiological Exams on Admission: Dg Chest 2 View  08/03/2013   CLINICAL DATA:  Cough, week, shortness of Breath  EXAM: CHEST - 2 VIEW  COMPARISON:  11/11/2012  FINDINGS: Patchy subsegmental atelectasis versus early infiltrate in the posteromedial right lower lobe, obscuring a portion of the diaphragmatic leaflet on the lateral radiograph. Left lung clear. Heart size upper limits normal. Mildly tortuous atheromatous aorta. Mild spondylitic changes in the lower thoracolumbar spine. Advanced degenerative changes in bilateral shoulders.  IMPRESSION: 1. Patchy infiltrate or subsegmental atelectasis in the posteromedial right lower lobe.   Electronically Signed   By: Oley Balm M.D.   On: 08/31/2013 15:08   Ct Head Wo Contrast  08/06/2013   CLINICAL DATA:  Confusion.  EXAM: CT HEAD WITHOUT CONTRAST  TECHNIQUE: Contiguous axial images were obtained from the base of the skull through the vertex without intravenous contrast.  COMPARISON:  11/11/2012  FINDINGS: There is atrophy and chronic small vessel disease changes. No acute  intracranial abnormality. Specifically, no hemorrhage, hydrocephalus, mass lesion, acute infarction, or significant intracranial injury. No acute calvarial abnormality. Visualized paranasal sinuses and mastoids clear. Orbital soft tissues unremarkable.  IMPRESSION: No acute intracranial abnormality.  Atrophy, chronic microvascular disease.   Electronically Signed   By: Charlett Nose M.D.   On: 08/27/2013 15:04    EKG: Independently reviewed. Sinus rhythm, rate of 85, peak T waves  Assessment/Plan Principal Problem:   Acute respiratory failure with hypercapnia Active Problems:   PERIPHERAL VASCULAR DISEASE   HTN (hypertension)   Chronic low back pain   ETOHism   Anemia of chronic disease   Hyperkalemia   Severe sepsis with acute organ dysfunction secondary to pneumonia   Acute renal failure   Metabolic acidosis  After further discussion with the patient, a daughter  by phone, daughter at bedside, patient has been made comfort care. Family and patient did not wish for intubation, dialysis, or invasive measures at this time. Patient will be placed on Dilaudid drip  titrated for comfort, as well as Ativan for anxiety and scopolamine patch. He'll be placed on supplemental oxygen as well and given comfort feeds.  DVT prophylaxis: None  Code Status: DO NOT INTUBATE DO NOT RESUSCITATE, comfort care  Condition: Critical  Family Communication: Daughter at bedside. Admission, patients condition and plan of care including tests being ordered have been discussed with the patient and daughter who indicate understanding and agree with the plan and Code Status.  Disposition Plan: Admitted  Time spent: 45 minutes  Arlis Everly D.O. [facility] Pager [phone]  If 7PM-7AM, please contact night-coverage [email or web address] Password [facility] 08/27/2013, 6:20 PM

## 2013-08-21 NOTE — Consult Note (Signed)
Name: Larry West MRN: [id number] DOB: Feb 21, 1926    ADMISSION DATE:  09-08-13 CONSULTATION DATE:  09-08-2013  REFERRING MD :  EDP  PRIMARY SERVICE: TR[facility]   CHIEF COMPLAINT:  Weak and confused   BRIEF PATIENT DESCRIPTION:  79 yo WM chronic Kidney disease with weakness and confusion brought to ER on 12/21 with acute renal failure and severe electrolytes imbalances   SIGNIFICANT EVENTS / STUDIES:    LINES / TUBES:   CULTURES: 12/21 BC x 2 >> 12/21 UC >>  ANTIBIOTICS:   HISTORY OF PRESENT ILLNESS:   Patient brought to the emergency department 12/21 by ambulance with multiple complaints. Patient has been complaining of pain all over, predominately in the shoulders. This is a chronic pain. He has been having increased anxiety and panic attacks over the past several days. Patient reportedly called his son this morning and when he went to see he noticed that he was confused and having difficulty breathing.  This very confused and agitated on arrival.  Found to have worsening renal failure with scr 3.8 to 9.62. BNP ~33K Lactic acid 1.04 ,. K+ 6.9  Given HCO3 IV , kayexalate and insuin in ER   Pt is accompanies by daughter and friend. He is calmer now and a/o x 2 . Says he is a DNR and does not want to be put on vent or have CPR .    PAST MEDICAL HISTORY :  Past Medical History  Diagnosis Date  . Hypertension   . Chronic low back pain     right leg pain   . Hip pain     left  . Seizures 2006  . ETOHism   . Cataract   . CAD (coronary artery disease) 2001    s/p PTCI  . PVD (posterior vitreous detachment), left eye   . Anemia     CKD  . Colon polyps   . Chronic kidney disease (CKD), stage III (moderate)   . Hyperlipidemia   . Hyperkalemia   . Bone pain 09/16/2012  . Vitamin B12 deficiency anemia 10/21/2012  . Prostatism 07/14/2013   Past Surgical History  Procedure Laterality Date  . Cataracts surgery    . Appendectomy  1955  . Gastric outlet obstruction  surgery  1988  . Lumbar disc surgery    . Femur im nail Right 11/11/2012    Procedure: INTRAMEDULLARY (IM) NAIL FEMORAL;  Surgeon: Kathryne Hitch, MD;  Location: [facility];  Service: Orthopedics;  Laterality: Right;   Prior to Admission medications   Medication Sig Start Date End Date Taking? Authorizing Provider  amLODipine (NORVASC) 10 MG tablet Take 10 mg by mouth daily.   Yes Historical Provider, MD  atorvastatin (LIPITOR) 40 MG tablet Take 1 tablet (40 mg total) by mouth at bedtime. 06/17/13  Yes Donita Brooks, MD  calcitRIOL (ROCALTROL) 0.25 MCG capsule Take 0.25 mcg by mouth daily.   Yes Historical Provider, MD  clopidogrel (PLAVIX) 75 MG tablet Take 75 mg by mouth daily with breakfast.   Yes Historical Provider, MD  diphenoxylate-atropine (LOMOTIL) 2.5-0.025 MG per tablet Take 2 tablets by mouth every 6 (six) hours as needed for diarrhea or loose stools.   Yes Historical Provider, MD  ferrous sulfate 325 (65 FE) MG EC tablet Take 325 mg by mouth daily with breakfast.   Yes Historical Provider, MD  phenytoin (DILANTIN) 100 MG ER capsule Take 200-300 mg by mouth 2 (two) times daily. Take 300mg  in the AM and 200mg   in the PM   Yes Historical Provider, MD   Allergies  Allergen Reactions  . Darvocet [Propoxyphene-Acetaminophen] Nausea Only  . Penicillins Swelling    Unknown    . Sulfonamide Derivatives Itching    FAMILY HISTORY:  Family History  Problem Relation Age of Onset  . Cancer Son 42    colon cancer   SOCIAL HISTORY:  reports that he has never smoked. He has never used smokeless tobacco. He reports that he drinks about 4.8 ounces of alcohol per week. He reports that he does not use illicit drugs.  REVIEW OF SYSTEMS:   Constitutional:   No  weight loss, night sweats,  Fevers, chills,  +atigue, or  lassitude.  HEENT:   No headaches,  Difficulty swallowing,  Tooth/dental problems, or  Sore throat,                No sneezing, itching, ear ache, nasal congestion,  post nasal drip,   CV:  No chest pain,  Orthopnea, PND, swelling in lower extremities, anasarca, dizziness, palpitations, syncope.   GI  No heartburn, indigestion, abdominal pain, nausea, vomiting, diarrhea, +change in bowel habits, loss of appetite,  Resp: + shortness of breath with exertion or at rest.   Noexcess mucus, no productive cough,  No non-productive cough,  No coughing up of blood.  No change in color of mucus.  No wheezing.  No chest wall deformity  Skin: no rash or lesions.  GU: no dysuria,  +hange in color of urine,  urgency or frequency.  No flank pain, no hematuria   MS:  ++joint pain   No back pain.  Psych:  +confusion    SUBJECTIVE: feel weak all over   VITAL SIGNS: Pulse Rate:  [83-85] 83 (12/21 1626) Resp:  [16-27] 16 (12/21 1626) BP: (143-155)/(89-106) 143/106 mmHg (12/21 1512) SpO2:  [100 %] 100 % (12/21 1626) Weight:  [63.504 kg (140 lb)] 63.504 kg (140 lb) (12/21 1341) HEMODYNAMICS:   VENTILATOR SETTINGS:   INTAKE / OUTPUT: Intake/Output   None     PHYSICAL EXAMINATION: GEN: A/Ox3; pleasant , NAD, frail , eldelry , dishelved.   HEENT:  [place]/AT,  EACs-clear, TMs-wnl, NOSE-clear, THROAT-clear, no lesions, no postnasal drip or exudate noted. Poor dentition , dry mucosa   NECK:  Supple w/ fair ROM; no JVD; normal carotid impulses w/o bruits; no thyromegaly or nodules palpated; no lymphadenopathy.  RESP  diminshed BS in bases no accessory muscle use, no dullness to percussion  CARD:  RRR, no m/r/g  , no peripheral edema, pulses intact, no cyanosis or clubbing.  GI:   Soft & nt; nml bowel sounds; no organomegaly or masses detected.  Musco: Warm bil, no deformities or joint swelling noted.   Neuro: alert /O x  Person , place   no focal deficits noted.    Skin: Warm, no lesions or rashes   LABS:  CBC  Recent Labs Lab 08/22/2013 1340  WBC 13.0*  HGB 11.7*  HCT 33.8*  PLT 178   Coag's  Recent Labs Lab 08/17/2013 1340  INR 1.68*    BMET  Recent Labs Lab 08/27/2013 1340  NA 129*  K 6.9*  CL 94*  CO2 <7*  BUN 193*  CREATININE 9.62*  GLUCOSE 132*   Electrolytes  Recent Labs Lab 08/26/2013 1340  CALCIUM 6.8*   Sepsis Markers  Recent Labs Lab 08/22/2013 1530  LATICACIDVEN 1.04   ABG  Recent Labs Lab 08/26/2013 1427  PHART 7.052*  PCO2ART 10.1*  PO2ART  136.0*   Liver Enzymes  Recent Labs Lab 08/23/2013 1340  AST 36  ALT 29  ALKPHOS 224*  BILITOT 0.3  ALBUMIN 3.1*   Cardiac Enzymes  Recent Labs Lab 08/31/2013 1340  TROPONINI <0.30  PROBNP 33918.0*   Glucose No results found for this basename: GLUCAP,  in the last 168 hours  Imaging Dg Chest 2 View  08/06/2013   CLINICAL DATA:  Cough, week, shortness of Breath  EXAM: CHEST - 2 VIEW  COMPARISON:  11/11/2012  FINDINGS: Patchy subsegmental atelectasis versus early infiltrate in the posteromedial right lower lobe, obscuring a portion of the diaphragmatic leaflet on the lateral radiograph. Left lung clear. Heart size upper limits normal. Mildly tortuous atheromatous aorta. Mild spondylitic changes in the lower thoracolumbar spine. Advanced degenerative changes in bilateral shoulders.  IMPRESSION: 1. Patchy infiltrate or subsegmental atelectasis in the posteromedial right lower lobe.   Electronically Signed   By: Oley Balm M.D.   On: 08/05/2013 15:08   Ct Head Wo Contrast  08/23/2013   CLINICAL DATA:  Confusion.  EXAM: CT HEAD WITHOUT CONTRAST  TECHNIQUE: Contiguous axial images were obtained from the base of the skull through the vertex without intravenous contrast.  COMPARISON:  11/11/2012  FINDINGS: There is atrophy and chronic small vessel disease changes. No acute intracranial abnormality. Specifically, no hemorrhage, hydrocephalus, mass lesion, acute infarction, or significant intracranial injury. No acute calvarial abnormality. Visualized paranasal sinuses and mastoids clear. Orbital soft tissues unremarkable.  IMPRESSION: No acute  intracranial abnormality.  Atrophy, chronic microvascular disease.   Electronically Signed   By: Charlett Nose M.D.   On: 08/17/2013 15:04     CXR: Patchy infiltrate or subsegmental atelectasis in the  posteromedial right lower lobe.    ASSESSMENT / PLAN:  PULMONARY A: ? Patchy RLL infiltrate, uncompensated met acidosis from uremia P:   O2 for sat >90%, if desat I would NOT escalate O2, provide comfort Levaquin per pharm dose adjust renal failure Bicarb for palliation for resp rate and K  No ABg further  CARDIOVASCULAR A: HTN  Elevated BNP  Hyperkalemia   P:  Hold HTN  rx for now  Bicarb drip Volume, re eval renal fxn  RENAL A:  Acute on Chronic Renal Failure  Chronic Kidney disease baseline scr ~3  Metabolic Acidosis -bicarb given in ER  Hyperkalemia -s/p kayelate /insuin   P:   IVF  Pt and family does not want ANY forms of life support, no HD, no pressors Repeat Tr bmet  Bicarb for K Renal US Foley Volume Dc tele  GASTROINTESTINAL A:  Malnutrition   P:   PPI/SUP  npo  HEMATOLOGIC A:  Anemia of Chronic Disease  P:  Monitor closely     NEUROLOGIC A:  Delirium secondary to electrolytes  P:   Treat uremia Morphine, may need to escalate to drip  TODAY'S SUMMARY: acute on chronic renal failure,s evere acidosis, k, he does not want any forms of life support, or HD, add and escalate morphine fails further, [place], bicarb, floor admission, NON tele  I have personally obtained a history, examined the patient, evaluated laboratory and imaging results, formulated the assessment and plan and placed orders. CRITICAL CARE: The patient is critically ill with multiple organ systems failure and requires high complexity decision making for assessment and support, frequent evaluation and titration of therapies, application of advanced monitoring technologies and extensive interpretation of multiple databases. Critical Care Time devoted to patient care services described in  this note is  30 minutes.   [facility] NP-C  Pulmonary and Critical Care Medicine [facility] Pager: [phone]  08/05/2013, 5:05 PM  Mcarthur Rossetti. Tyson Alias, MD, FACP Pgr: [phone] [facility]

## 2013-08-21 NOTE — ED Notes (Signed)
Patient transported to CT 

## 2013-08-21 NOTE — ED Notes (Signed)
Attempted to obtain urine by I&O cath. No results. Notified Dr. Blinda Leatherwood.

## 2013-08-22 DIAGNOSIS — E872 Acidosis: Secondary | ICD-10-CM

## 2013-08-22 DIAGNOSIS — R0609 Other forms of dyspnea: Secondary | ICD-10-CM

## 2013-08-22 LAB — MRSA PCR SCREENING: MRSA by PCR: NEGATIVE

## 2013-08-22 MED ORDER — ATROPINE SULFATE 1 % OP SOLN
2.0000 [drp] | Freq: Four times a day (QID) | OPHTHALMIC | Status: DC
  Administered 2013-08-22 – 2013-08-23 (×8): 2 [drp] via SUBLINGUAL
  Filled 2013-08-22: qty 2

## 2013-08-22 MED ORDER — HYDROMORPHONE HCL PF 1 MG/ML IJ SOLN: 1.0000 mg | INTRAMUSCULAR | Status: DC | PRN

## 2013-08-22 NOTE — Progress Notes (Addendum)
PATIENT DETAILS Name: Larry West Age: 79 y.o. Sex: male Date of Birth: 17-Nov-1925 Admit Date: 08-28-2013 Admitting Physician Edsel Petrin, DO DGU:[id number] [name], MD  Subjective: Unresponsive, barely arousable. On a Dilaudid drip.   Assessment/Plan: Principal Problem:   Acute respiratory failure with hypoxia - Suspect secondary to pneumonia, tachypnea secondary to severe metabolic acidosis. - No aggressive care, DO NOT RESUSCITATE and full comfort care. On a Dilaudid infusion- titrate for comfort   Active Problems: Acute on chronic renal failure - Family not keen on any aggressive care including dialysis. - Have transitioned to comfort care, on a Dilaudid infusion. - No further lab work.  Hyperlipidemia - Secondary to above - Comfort care and on Dilaudid infusion, no further lab work or monitoring.  Severe metabolic acidosis - Secondary to severe renal failure -. Had stopped Bicarbonate infusion- comfort care  Severe sepsis  - Secondary to pneumonia  - No antibiotics - comfort care   Chronic pain syndrome - Long history of chronic pain. - Transitioned to comfort care on Dilaudid drip. Comfortable.  History of hypertension - Off all antihypertensive medications- comfort care  History of dyslipidemia - Of statins-comfort care.  Disposition: Remain inpatient-suspect inpatient death, but depending on clinical course may need to be moved to residential hospice  DVT Prophylaxis: SCD's  Code Status: DO NOT RESUSCITATE   Family Communication  son and daughter-agreeable to continue with comfort care status   Procedures:   none  CONSULTS:  Palliative care    MEDICATIONS: Scheduled Meds: . atropine  2 drop Sublingual QID  . scopolamine  1 patch Transdermal Q72H   Continuous Infusions: . sodium chloride 20 mL/hr (08/22/13 1108)  . HYDROmorphone 0.5 mg/hr (08/28/2013 2224)   PRN Meds:.HYDROmorphone (DILAUDID) injection,  LORazepam  Antibiotics: Anti-infectives   None       PHYSICAL EXAM: Vital signs in last 24 hours: Filed Vitals:   28-Aug-2013 1800 08-28-2013 1942 08/28/13 2028 08/22/13 0602  BP: 153/74 149/84 148/70 100/43  Pulse: 100 98 96 113  Temp:   97.6 F (36.4 C) 98.5 F (36.9 C)  TempSrc:   Oral   Resp: 21 20 21 20   Height:   5\' 9"  (1.753 m)   Weight:   69.446 kg (153 lb 1.6 oz)   SpO2: 100% 100% 100% 100%    Weight change:  Filed Weights   August 28, 2013 1341 08/28/2013 2028  Weight: 63.504 kg (140 lb) 69.446 kg (153 lb 1.6 oz)   Body mass index is 22.6 kg/(m^2).   Gen Exam: unresponsive, moans and mumbles incoherently. Appears comfortable. Chest: B/L Clear.  Transmitted upper airway sound  CVS: S1 S2 Regular Abdomen: soft, BS +, non tender, non distended. Extremities: no edema, lower extremities warm to touch. Neurologic: Unresponsive-did not examine  further.  Intake/Output from previous day: No intake or output data in the 24 hours ending 08/22/13 1203   LAB RESULTS: CBC  Recent Labs Lab August 28, 2013 1340  WBC 13.0*  HGB 11.7*  HCT 33.8*  PLT 178  MCV 104.3*  MCH 36.1*  MCHC 34.6  RDW 15.9*  LYMPHSABS 0.7  MONOABS 0.5  EOSABS 0.0  BASOSABS 0.0    Chemistries   Recent Labs Lab 2013/08/28 1340 08/28/2013 2130  NA 129* 135  K 6.9* 5.3*  CL 94* 101  CO2 <7* <7*  GLUCOSE 132* 112*  BUN 193* 196*  CREATININE 9.62* 9.42*  CALCIUM 6.8* 6.6*    CBG: No results found for this basename: GLUCAP,  in the last  168 hours  GFR Estimated Creatinine Clearance: 5.6 ml/min (by C-G formula based on Cr of 9.42).  Coagulation profile  Recent Labs Lab 08/22/2013 1340  INR 1.68*    Cardiac Enzymes  Recent Labs Lab 08/19/2013 1340  TROPONINI <0.30    No components found with this basename: POCBNP,  No results found for this basename: DDIMER,  in the last 72 hours No results found for this basename: HGBA1C,  in the last 72 hours No results found for this basename:  CHOL, HDL, LDLCALC, TRIG, CHOLHDL, LDLDIRECT,  in the last 72 hours No results found for this basename: TSH, T4TOTAL, FREET3, T3FREE, THYROIDAB,  in the last 72 hours No results found for this basename: VITAMINB12, FOLATE, FERRITIN, TIBC, IRON, RETICCTPCT,  in the last 72 hours No results found for this basename: LIPASE, AMYLASE,  in the last 72 hours  Urine Studies No results found for this basename: UACOL, UAPR, USPG, UPH, UTP, UGL, UKET, UBIL, UHGB, UNIT, UROB, ULEU, UEPI, UWBC, URBC, UBAC, CAST, CRYS, UCOM, BILUA,  in the last 72 hours  MICROBIOLOGY: No results found for this or any previous visit (from the past 240 hour(s)).  RADIOLOGY STUDIES/RESULTS: Dg Chest 2 View  08/31/2013   CLINICAL DATA:  Cough, week, shortness of Breath  EXAM: CHEST - 2 VIEW  COMPARISON:  11/11/2012  FINDINGS: Patchy subsegmental atelectasis versus early infiltrate in the posteromedial right lower lobe, obscuring a portion of the diaphragmatic leaflet on the lateral radiograph. Left lung clear. Heart size upper limits normal. Mildly tortuous atheromatous aorta. Mild spondylitic changes in the lower thoracolumbar spine. Advanced degenerative changes in bilateral shoulders.  IMPRESSION: 1. Patchy infiltrate or subsegmental atelectasis in the posteromedial right lower lobe.   Electronically Signed   By: Oley Balm M.D.   On: 08/15/2013 15:08   Ct Head Wo Contrast  08/29/2013   CLINICAL DATA:  Confusion.  EXAM: CT HEAD WITHOUT CONTRAST  TECHNIQUE: Contiguous axial images were obtained from the base of the skull through the vertex without intravenous contrast.  COMPARISON:  11/11/2012  FINDINGS: There is atrophy and chronic small vessel disease changes. No acute intracranial abnormality. Specifically, no hemorrhage, hydrocephalus, mass lesion, acute infarction, or significant intracranial injury. No acute calvarial abnormality. Visualized paranasal sinuses and mastoids clear. Orbital soft tissues unremarkable.   IMPRESSION: No acute intracranial abnormality.  Atrophy, chronic microvascular disease.   Electronically Signed   By: Charlett Nose M.D.   On: 08/26/2013 15:04   US Renal  08/12/2013   CLINICAL DATA:  Renal failure.  EXAM: RENAL/URINARY TRACT ULTRASOUND COMPLETE  COMPARISON:  None.  FINDINGS: Right Kidney:  Length: Atrophy with 8.9 cm longest span. Echogenic cortex compatible with chronic medical renal disease. Normal central sinus echo complex.  Left Kidney:  Length: Atrophy with 8.4 cm long axis span. Echogenic cortex compatible with medical renal disease. Normal central sinus echo complex.  Bladder:  Nonvisualization due to Foley catheter.  IMPRESSION: Small echogenic kidneys bilaterally compatible with chronic medical renal disease.   Electronically Signed   By: Andreas Newport M.D.   On: 08/02/2013 19:16    Jeoffrey Massed, MD  [facility] Pager:[phone]  If 7PM-7AM, please contact night-coverage [email or web address] Password [email or web address] 08/22/2013, 12:03 PM   LOS: 1 day

## 2013-08-22 NOTE — Progress Notes (Signed)
MRSA PCR -. Contact isolation removed.

## 2013-08-22 NOTE — Progress Notes (Addendum)
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 Placed on Contact isolation. After consulting with Bo Merino, RN in Infection Prevention, decision made to not proceed with order set medications since pt is comfort care at this time. Will send new PCR.

## 2013-08-22 NOTE — Progress Notes (Signed)
Utilization Review Completed.Larry West T12/22/2014  

## 2013-08-22 NOTE — Consult Note (Signed)
Patient ZO:XWRUEA H Benedicto      DOB: 08-15-1926      VWU:[id number]     Consult Note from the Palliative Medicine Team at [facility]    Consult Requested by: Larry West West     PCP: Larry Grosser, MD Reason for Consultation:Clarification of GOC and options     Phone [phone]  Assessment of patients Current state:  With a history of hypertension, alcoholism, seizures, was brought in to the emergency department via EMS for confusion as well as shortness of breath. It seems over the past few days patient has been complaining of pain all over, as well as anxiety and panic attacks. His son called him this morning and noticed that his father is having some confusion with difficulty breathing, at which point EMS was called.  Continued decline and family focus is comfort Renal failure BUN 196/Cr 9.42.    This NP Larry West West reviewed medical records, received report from team, assessed the patient and then meet at the patient's bedside along with his son Larry West West, and by telephone with his daughter Larry West West  to discuss diagnosis prognosis, GOC, EOL wishes disposition and options.   A detailed discussion was had today regarding advanced directives.  Concepts specific to code status, artifical feeding and hydration, continued IV antibiotics and rehospitalization was had.  The difference between a aggressive medical intervention path  and a palliative comfort care path for this patient at this time was had.  Values and goals of care important to patient and family were attempted to be elicited.  Concept of Hospice and Palliative Care were discussed  Natural trajectory and expectations at EOL were discussed.  Questions and concerns addressed.  Hard Choices booklet left for review. Family encouraged to call with questions or concerns.  PMT will continue to support holistically.     Goals of Care: 1.  Code Status: DNR/DNI-comfort is main focus of care   2. Scope of Treatment: 1. Vital  Signs:daily 2. Respiratory/Oxygen: as needed for comfort 3. Nutritional Support/Tube Feeds:no artifical feeding now or in the future 4. Antibiotics:none 5. Blood Products:none 6. VHQ:IONG 7. Review of Medications to be discontinued:minimize for comfort only 8. Labs:none 9. Telemetry:none   4. Disposition:  Dependant on outcomes, will reevaluate in morning, family is open to residential if it makes sese to disposition   3. Symptom Management:   1. Anxiety/Agitation: Ativan 1 mg every 1 hr prn 2. Pain/Dyspnea;  Dilaudid gtt now at 1 mg an hour, with orders to titrate to comfort 3. Terminal Secretions: Scopolamine patch as directed  4. Psychosocial:  Emotional support to family.  All understand the limited prognisis  5. Spiritual:  Spiritual care consulted, patient is of the [employer] faith    Patient Documents Completed or Given: Document Given Completed  Advanced Directives Pkt    MOST    DNR    Gone from My Sight    Hard Choices yes     Brief EXB:MWUX a history of hypertension, alcoholism, seizures, was brought in to the emergency department via EMS for confusion as well as shortness of breath. It seems over the past few days patient has been complaining of pain all over, as well as anxiety and panic attacks. His son called him this morning and noticed that his father is having some confusion with difficulty breathing, at which point EMS was called.   Renal failure BUN 196/Cr 9.42    ROS: unable to illicit, patient is minimally responsive    PMH:  Past  Medical History  Diagnosis Date  . Hypertension   . Chronic low back pain     right leg pain   . Hip pain     left  . Seizures 2006  . ETOHism   . Cataract   . CAD (coronary artery disease) 2001    s/p PTCI  . PVD (posterior vitreous detachment), left eye   . Anemia     CKD  . Colon polyps   . Chronic kidney disease (CKD), stage III (moderate)   . Hyperlipidemia   . Hyperkalemia   . Bone pain 09/16/2012  .  Vitamin B12 deficiency anemia 10/21/2012  . Prostatism 07/14/2013     PSH: Past Surgical History  Procedure Laterality Date  . Cataracts surgery    . Appendectomy  1955  . Gastric outlet obstruction surgery  1988  . Lumbar disc surgery    . Femur im nail Right 11/11/2012    Procedure: INTRAMEDULLARY (IM) NAIL FEMORAL;  Surgeon: Larry West Hitch, MD;  Location: [facility];  Service: Orthopedics;  Laterality: Right;   I have reviewed the FH and SH and  If appropriate update it with new information. Allergies  Allergen Reactions  . Darvocet [Propoxyphene-Acetaminophen] Nausea Only  . Penicillins Swelling    Unknown    . Sulfonamide Derivatives Itching   Scheduled Meds: . atropine  2 drop Sublingual QID  . scopolamine  1 patch Transdermal Q72H   Continuous Infusions: . sodium chloride 20 mL/hr (08/22/13 1108)  . HYDROmorphone 0.5 mg/hr (2013-09-03 2224)   PRN Meds:.HYDROmorphone (DILAUDID) injection, LORazepam    BP 100/43  Pulse 113  Temp(Src) 98.5 F (36.9 C) (Oral)  Resp 20  Ht 5\' 9"  (1.753 m)  Wt 69.446 kg (153 lb 1.6 oz)  BMI 22.60 kg/m2  SpO2 100%   PPS:20%  No intake or output data in the 24 hours ending 08/22/13 1218  Physical Exam:  General: NAD, transitioning at EOL HEENT:  Dry buccal membranes, noted temporal muscle wasting Chest:   Diminished in bases, CTA CVS: tacycardic Abdomen: soft decreased BS Ext: warm without edema Neuro: minimally responsive to gentle tocuh and verbal stimuli  Labs: CBC    Component Value Date/Time   WBC 13.0* 09/03/2013 1340   WBC 4.9 08/11/2013 0817   RBC 3.24* 09-03-13 1340   RBC 3.04* 08/11/2013 0817   HGB 11.7* 09-03-2013 1340   HGB 10.7* 08/11/2013 0817   HCT 33.8* Sep 03, 2013 1340   HCT 32.6* 08/11/2013 0817   PLT 178 Sep 03, 2013 1340   PLT 225 08/11/2013 0817   MCV 104.3* 2013-09-03 1340   MCV 107.2* 08/11/2013 0817   MCH 36.1* 03-Sep-2013 1340   MCH 35.2* 08/11/2013 0817   MCHC 34.6 09/03/13 1340    MCHC 32.8 08/11/2013 0817   RDW 15.9* 09/03/2013 1340   RDW 18.4* 08/11/2013 0817   LYMPHSABS 0.7 09-03-13 1340   LYMPHSABS 1.2 08/11/2013 0817   MONOABS 0.5 09-03-13 1340   MONOABS 0.6 08/11/2013 0817   EOSABS 0.0 2013/09/03 1340   EOSABS 0.1 08/11/2013 0817   BASOSABS 0.0 Sep 03, 2013 1340   BASOSABS 0.0 08/11/2013 0817    BMET    Component Value Date/Time   NA 135 2013/09/03 2130   NA 137 07/14/2013 0806   K 5.3* 2013/09/03 2130   K 5.5* 07/14/2013 0806   CL 101 2013/09/03 2130   CL 111* 02/03/2013 0902   CO2 <7* September 03, 2013 2130   CO2 11* 07/14/2013 0806   GLUCOSE 112* Sep 03, 2013 2130  GLUCOSE 90 07/14/2013 0806   GLUCOSE 101* 02/03/2013 0902   BUN 196* 28-Aug-2013 2130   BUN 93.9* 07/14/2013 0806   CREATININE 9.42* 08-28-2013 2130   CREATININE 3.8* 07/14/2013 0806   CREATININE 2.27* 01/04/2013 1002   CALCIUM 6.6* 28-Aug-2013 2130   CALCIUM 8.4 07/14/2013 0806   CALCIUM 9.2 06/11/2010 0813   GFRNONAA 4* 08/28/13 2130   GFRAA 5* 08/28/2013 2130    CMP     Component Value Date/Time   NA 135 August 28, 2013 2130   NA 137 07/14/2013 0806   K 5.3* 08-28-13 2130   K 5.5* 07/14/2013 0806   CL 101 08/28/2013 2130   CL 111* 02/03/2013 0902   CO2 <7* 2013/08/28 2130   CO2 11* 07/14/2013 0806   GLUCOSE 112* August 28, 2013 2130   GLUCOSE 90 07/14/2013 0806   GLUCOSE 101* 02/03/2013 0902   BUN 196* 2013/08/28 2130   BUN 93.9* 07/14/2013 0806   CREATININE 9.42* Aug 28, 2013 2130   CREATININE 3.8* 07/14/2013 0806   CREATININE 2.27* 01/04/2013 1002   CALCIUM 6.6* Aug 28, 2013 2130   CALCIUM 8.4 07/14/2013 0806   CALCIUM 9.2 06/11/2010 0813   PROT 8.2 2013/08/28 1340   PROT 7.2 07/14/2013 0806   ALBUMIN 3.1* 08/28/2013 1340   ALBUMIN 2.9* 07/14/2013 0806   AST 36 08-28-2013 1340   AST 13 07/14/2013 0806   ALT 29 08-28-13 1340   ALT 10 07/14/2013 0806   ALKPHOS 224* 08-28-2013 1340   ALKPHOS 123 07/14/2013 0806   BILITOT 0.3 08-28-2013 1340   BILITOT 0.26 07/14/2013 0806    GFRNONAA 4* 08/28/13 2130   GFRAA 5* 08-28-2013 2130      Time In Time Out Total Time Spent with Patient Total Overall Time  1215 1315 60 min 60 min    Greater than 50%  of this time was spent counseling and coordinating care related to the above assessment and plan.   Larry West Creed NP  Palliative Medicine Team Team Phone # [phone] Pager [phone]

## 2013-08-23 DIAGNOSIS — Z515 Encounter for palliative care: Secondary | ICD-10-CM

## 2013-08-23 DIAGNOSIS — R0600 Dyspnea, unspecified: Secondary | ICD-10-CM

## 2013-08-23 LAB — GLUCOSE, CAPILLARY: Glucose-Capillary: 119 mg/dL — ABNORMAL HIGH (ref 70–99)

## 2013-08-23 NOTE — Care Management Note (Addendum)
  Page 1 of 1   08/23/2013     9:58:29 AM   CARE MANAGEMENT NOTE 08/23/2013  Patient:  Larry West, Larry West   Account Number:  [id number]  Date Initiated:  08/23/2013  Documentation initiated by:  Ronny Flurry  Subjective/Objective Assessment:     Action/Plan:   Anticipated DC Date:     Anticipated DC Plan:           Choice offered to / List presented to:             Status of service:   Medicare Important Message given?   (If response is "NO", the following Medicare IM given date fields will be blank) Date Medicare IM given:   Date Additional Medicare IM given:    Discharge Disposition:    Per UR Regulation:    If discussed at Long Length of Stay Meetings, dates discussed:    Comments:    08-23-13 Angela Burke returned call . Explained GIP , she agreed , stated any paper work that needs to to done can be by her brother Lean Fayson [phone] . Ronny Flurry RN BSN [phone]    08-23-13 Spoke to daughter Shawna Orleans at patient's bedside to explain GIP . Shawna Orleans explained she has been estranged from her father for five years , her sister Angela Burke makes all decisions .  Called Angela Burke at [phone] left voice mail awaiting call back. Ronny Flurry RN BSN [phone]    08-23-13 If patient is GIP appropriate , Dr Jerral Ralph would like Palliative Medicine to be attending . Ronny Flurry RN BSN [phone]   08-23-13 Consult for "GIP" , called Forrestine Him , awaiting call back. Ronny Flurry RN BSN [phone]

## 2013-08-23 NOTE — Progress Notes (Signed)
PATIENT DETAILS Name: Larry West Age: 79 y.o. Sex: male Date of : [birth year] Admit Date: 08/11/2013 Admitting Physician Edsel Petrin, DO ZOX:[id number] [name], MD  Brief summary Patient is 79 year old male with a history of hypertension, alcoholism, seizures, was brought in to the emergency department via EMS for confusion as well as shortness of breath. It seems over the past few days patient has been complaining of pain all over, as well as anxiety and panic attacks. His son called him this morning and noticed that his father is having some confusion with difficulty breathing, at which point EMS was called. Patient was found be very confused and agitated upon arrival. Upon arrival to emergency department, patient was found to have end organ damage with a creatinine of 9.62, respiratory distress, metabolic acidosis. PCCM was called initially however it was decided upon discussion with the family as well as the patient to make the patient comfort care. Patient was then admitted and started on a Dilaudid infusion.  Subjective: Unresponsive, barely arousable. Labored breathing  Assessment/Plan: Principal Problem:   Acute respiratory failure with hypoxia - Suspect secondary to pneumonia, tachypnea secondary to severe metabolic acidosis. - No aggressive care, DO NOT RESUSCITATE and full comfort care. On a Dilaudid infusion- titrate for comfort   Active Problems: Acute on chronic renal failure - Family not keen on any aggressive care including dialysis. - Have transitioned to comfort care, on a Dilaudid infusion. - No further lab work.  Hyperlipidemia - Secondary to above - Comfort care and on Dilaudid infusion, no further lab work or monitoring.  Severe metabolic acidosis - Secondary to severe renal failure -. Had stopped Bicarbonate infusion- comfort care  Severe sepsis  - Secondary to pneumonia  - No antibiotics - comfort care   Chronic pain syndrome - Long  history of chronic pain. - Transitioned to comfort care on Dilaudid drip. Comfortable.  History of hypertension - Off all antihypertensive medications- comfort care  History of dyslipidemia - Off statins-comfort care.  Disposition: Remain inpatient-suspect inpatient death, but depending on clinical course may need to be moved to residential hospice. Palliative care medicine on board-? GIP candidate  DVT Prophylaxis: SCD's  Code Status: DO NOT RESUSCITATE   Family Communication  son and daughter-agreeable to continue with comfort care status   Procedures:   none  CONSULTS:  Palliative care    MEDICATIONS: Scheduled Meds: . atropine  2 drop Sublingual QID  . scopolamine  1 patch Transdermal Q72H   Continuous Infusions: . sodium chloride 20 mL/hr (08/22/13 1108)  . HYDROmorphone 2 mg/hr (08/23/13 0752)   PRN Meds:.HYDROmorphone (DILAUDID) injection, LORazepam  Antibiotics: Anti-infectives   None       PHYSICAL EXAM: Vital signs in last 24 hours: Filed Vitals:   08/09/2013 1942 08/02/2013 2028 08/22/13 0602 08/23/13 0451  BP: 149/84 148/70 100/43 117/75  Pulse: 98 96 113 126  Temp:  97.6 F (36.4 C) 98.5 F (36.9 C) 98.5 F (36.9 C)  TempSrc:  Oral  Oral  Resp: 20 21 20 20   Height:  5\' 9"  (1.753 m)    Weight:  69.446 kg (153 lb 1.6 oz)    SpO2: 100% 100% 100% 97%    Weight change:  Filed Weights   08/03/2013 1341 08/15/2013 2028  Weight: 63.504 kg (140 lb) 69.446 kg (153 lb 1.6 oz)   Body mass index is 22.6 kg/(m^2).   Gen Exam: unresponsive, moans and mumbles incoherently. Appears comfortable. Chest: B/L Clear.  Transmitted upper airway sound  CVS: S1 S2 Regular Abdomen: soft, BS +, non tender, non distended. Extremities: no edema, lower extremities warm to touch. Neurologic: Unresponsive-did not examine  further.  Intake/Output from previous day:  Intake/Output Summary (Last 24 hours) at 08/23/13 1225 Last data filed at 08/23/13 0600  Gross per  24 hour  Intake    504 ml  Output    375 ml  Net    129 ml     LAB RESULTS: CBC  Recent Labs Lab 08/06/2013 1340  WBC 13.0*  HGB 11.7*  HCT 33.8*  PLT 178  MCV 104.3*  MCH 36.1*  MCHC 34.6  RDW 15.9*  LYMPHSABS 0.7  MONOABS 0.5  EOSABS 0.0  BASOSABS 0.0    Chemistries   Recent Labs Lab 08/08/2013 1340 08/18/2013 2130  NA 129* 135  K 6.9* 5.3*  CL 94* 101  CO2 <7* <7*  GLUCOSE 132* 112*  BUN 193* 196*  CREATININE 9.62* 9.42*  CALCIUM 6.8* 6.6*    CBG:  Recent Labs Lab 08/31/2013 1831  GLUCAP 119*    GFR Estimated Creatinine Clearance: 5.6 ml/min (by C-G formula based on Cr of 9.42).  Coagulation profile  Recent Labs Lab 08/13/2013 1340  INR 1.68*    Cardiac Enzymes  Recent Labs Lab 08/18/2013 1340  TROPONINI <0.30    No components found with this basename: POCBNP,  No results found for this basename: DDIMER,  in the last 72 hours No results found for this basename: HGBA1C,  in the last 72 hours No results found for this basename: CHOL, HDL, LDLCALC, TRIG, CHOLHDL, LDLDIRECT,  in the last 72 hours No results found for this basename: TSH, T4TOTAL, FREET3, T3FREE, THYROIDAB,  in the last 72 hours No results found for this basename: VITAMINB12, FOLATE, FERRITIN, TIBC, IRON, RETICCTPCT,  in the last 72 hours No results found for this basename: LIPASE, AMYLASE,  in the last 72 hours  Urine Studies No results found for this basename: UACOL, UAPR, USPG, UPH, UTP, UGL, UKET, UBIL, UHGB, UNIT, UROB, ULEU, UEPI, UWBC, URBC, UBAC, CAST, CRYS, UCOM, BILUA,  in the last 72 hours  MICROBIOLOGY: Recent Results (from the past 240 hour(s))  MRSA PCR SCREENING     Status: None   Collection Time    08/22/13 10:58 AM      Result Value Range Status   MRSA by PCR NEGATIVE  NEGATIVE Final   Comment:            The GeneXpert MRSA Assay (FDA     approved for NASAL specimens     only), is one component of a     comprehensive MRSA colonization     surveillance  program. It is not     intended to diagnose MRSA     infection nor to guide or     monitor treatment for     MRSA infections.    RADIOLOGY STUDIES/RESULTS: Dg Chest 2 View  08/02/2013   CLINICAL DATA:  Cough, week, shortness of Breath  EXAM: CHEST - 2 VIEW  COMPARISON:  11/11/2012  FINDINGS: Patchy subsegmental atelectasis versus early infiltrate in the posteromedial right lower lobe, obscuring a portion of the diaphragmatic leaflet on the lateral radiograph. Left lung clear. Heart size upper limits normal. Mildly tortuous atheromatous aorta. Mild spondylitic changes in the lower thoracolumbar spine. Advanced degenerative changes in bilateral shoulders.  IMPRESSION: 1. Patchy infiltrate or subsegmental atelectasis in the posteromedial right lower lobe.   Electronically Signed   By: Kerry Kass.D.  On: 08/23/2013 15:08   Ct Head Wo Contrast  08/27/2013   CLINICAL DATA:  Confusion.  EXAM: CT HEAD WITHOUT CONTRAST  TECHNIQUE: Contiguous axial images were obtained from the base of the skull through the vertex without intravenous contrast.  COMPARISON:  11/11/2012  FINDINGS: There is atrophy and chronic small vessel disease changes. No acute intracranial abnormality. Specifically, no hemorrhage, hydrocephalus, mass lesion, acute infarction, or significant intracranial injury. No acute calvarial abnormality. Visualized paranasal sinuses and mastoids clear. Orbital soft tissues unremarkable.  IMPRESSION: No acute intracranial abnormality.  Atrophy, chronic microvascular disease.   Electronically Signed   By: Charlett Nose M.D.   On: 08/09/2013 15:04   US Renal  08/03/2013   CLINICAL DATA:  Renal failure.  EXAM: RENAL/URINARY TRACT ULTRASOUND COMPLETE  COMPARISON:  None.  FINDINGS: Right Kidney:  Length: Atrophy with 8.9 cm longest span. Echogenic cortex compatible with chronic medical renal disease. Normal central sinus echo complex.  Left Kidney:  Length: Atrophy with 8.4 cm long axis span.  Echogenic cortex compatible with medical renal disease. Normal central sinus echo complex.  Bladder:  Nonvisualization due to Foley catheter.  IMPRESSION: Small echogenic kidneys bilaterally compatible with chronic medical renal disease.   Electronically Signed   By: Andreas Newport M.D.   On: 08/31/2013 19:16    Jeoffrey Massed, MD  [facility] Pager:[phone]  If 7PM-7AM, please contact night-coverage [email or web address] Password [facility] 08/23/2013, 12:25 PM   LOS: 2 days

## 2013-08-23 NOTE — Progress Notes (Signed)
[facility]) Social Work note: Received request from [facility] for patient to be evaluated to be admitted to hospice in the hospital. [facility] medical director approved with diagnosis ESRD. Spoke with son Yahia Bottger per [facility] request. Barrington reports he and his sister Olegario Messier are not comfortable completing paper work or making decisions. Sammy also reports he is out of town until evening and not available for paperwork. Understand from [facility] another daughter lives in [place] and has been driving decisions. Will follow up with RNCM in am. Thank you. Forrestine Him LCSW [phone]

## 2013-08-23 NOTE — Progress Notes (Signed)
Progress Note from the Palliative Medicine Team at [facility]  Subjective: patietn minimally resposnive to gentle tocuh  -daughter Olegario Messier at bedside, continued conversation regarding GOC, symptom management, natural trajectory at EOL     Objective: Allergies  Allergen Reactions  . Darvocet [Propoxyphene N-Acetaminophen] Nausea Only  . Penicillins Swelling    Unknown    . Sulfonamide Derivatives Itching   Scheduled Meds: . atropine  2 drop Sublingual QID  . scopolamine  1 patch Transdermal Q72H   Continuous Infusions: . sodium chloride 20 mL/hr (08/22/13 1108)  . HYDROmorphone 2 mg/hr (08/23/13 0752)   PRN Meds:.HYDROmorphone (DILAUDID) injection, LORazepam  BP 117/75  Pulse 126  Temp(Src) 98.5 F (36.9 C) (Oral)  Resp 20  Ht 5\' 9"  (1.753 m)  Wt 69.446 kg (153 lb 1.6 oz)  BMI 22.60 kg/m2  SpO2 97%   PPS:20 % at best    Intake/Output Summary (Last 24 hours) at 08/23/13 1119 Last data filed at 08/23/13 0600  Gross per 24 hour  Intake    504 ml  Output    375 ml  Net    129 ml       Physical Exam:  General: NAD, transitioning at EOL  HEENT: Dry buccal membranes, noted temporal muscle wasting, audible throat secretions  Chest: Diminished in bases, CTA  CVS: tacycardic  Abdomen: soft decreased BS  Ext: warm without edema  Neuro: minimally responsive to gentle tocuh and verbal stimuli   Labs: CBC    Component Value Date/Time   WBC 13.0* Aug 25, 2013 1340   WBC 4.9 08/11/2013 0817   RBC 3.24* 08/25/2013 1340   RBC 3.04* 08/11/2013 0817   HGB 11.7* Aug 25, 2013 1340   HGB 10.7* 08/11/2013 0817   HCT 33.8* 08-25-2013 1340   HCT 32.6* 08/11/2013 0817   PLT 178 08-25-2013 1340   PLT 225 08/11/2013 0817   MCV 104.3* 08/25/2013 1340   MCV 107.2* 08/11/2013 0817   MCH 36.1* 2013/08/25 1340   MCH 35.2* 08/11/2013 0817   MCHC 34.6 Aug 25, 2013 1340   MCHC 32.8 08/11/2013 0817   RDW 15.9* August 25, 2013 1340   RDW 18.4* 08/11/2013 0817   LYMPHSABS 0.7 08-25-2013  1340   LYMPHSABS 1.2 08/11/2013 0817   MONOABS 0.5 08-25-13 1340   MONOABS 0.6 08/11/2013 0817   EOSABS 0.0 08-25-13 1340   EOSABS 0.1 08/11/2013 0817   BASOSABS 0.0 2013/08/25 1340   BASOSABS 0.0 08/11/2013 0817    BMET    Component Value Date/Time   NA 135 August 25, 2013 2130   NA 137 07/14/2013 0806   K 5.3* 08-25-2013 2130   K 5.5* 07/14/2013 0806   CL 101 08-25-13 2130   CL 111* 02/03/2013 0902   CO2 <7* 2013-08-25 2130   CO2 11* 07/14/2013 0806   GLUCOSE 112* 25-Aug-2013 2130   GLUCOSE 90 07/14/2013 0806   GLUCOSE 101* 02/03/2013 0902   BUN 196* 2013/08/25 2130   BUN 93.9* 07/14/2013 0806   CREATININE 9.42* 25-Aug-2013 2130   CREATININE 3.8* 07/14/2013 0806   CREATININE 2.27* 01/04/2013 1002   CALCIUM 6.6* 2013-08-25 2130   CALCIUM 8.4 07/14/2013 0806   CALCIUM 9.2 06/11/2010 0813   GFRNONAA 4* August 25, 2013 2130   GFRAA 5* 2013-08-25 2130    CMP     Component Value Date/Time   NA 135 25-Aug-2013 2130   NA 137 07/14/2013 0806   K 5.3* 08-25-13 2130   K 5.5* 07/14/2013 0806   CL 101 08-25-2013 2130   CL 111* 02/03/2013 0902  CO2 <7* 08/08/2013 2130   CO2 11* 07/14/2013 0806   GLUCOSE 112* 08/04/2013 2130   GLUCOSE 90 07/14/2013 0806   GLUCOSE 101* 02/03/2013 0902   BUN 196* 08/26/2013 2130   BUN 93.9* 07/14/2013 0806   CREATININE 9.42* 08/12/2013 2130   CREATININE 3.8* 07/14/2013 0806   CREATININE 2.27* 01/04/2013 1002   CALCIUM 6.6* 08/20/2013 2130   CALCIUM 8.4 07/14/2013 0806   CALCIUM 9.2 06/11/2010 0813   PROT 8.2 08/27/2013 1340   PROT 7.2 07/14/2013 0806   ALBUMIN 3.1* 08/11/2013 1340   ALBUMIN 2.9* 07/14/2013 0806   AST 36 08/05/2013 1340   AST 13 07/14/2013 0806   ALT 29 08/08/2013 1340   ALT 10 07/14/2013 0806   ALKPHOS 224* 08/22/2013 1340   ALKPHOS 123 07/14/2013 0806   BILITOT 0.3 08/07/2013 1340   BILITOT 0.26 07/14/2013 0806   GFRNONAA 4* 08/11/2013 2130   GFRAA 5* 08/18/2013 2130     Assessment and Plan: 1. Code  Status:DNR/DNI-comfort is main focus of care 2. Symptom Control: 1. Anxiety/Agitation: Ativan 1 mg every 1 hr prn 2. Pain/Dyspnea; Dilaudid gtt now at 1 mg an hour, with orders to titrate to comfort 3. Terminal Secretions: Scopolamine patch as directed   3. Psycho/Social:  Emotional support offered to family at beside.  Olegario Messier verbalizes feelings of sdaness regrding family disfunction 4. Spiritual   [facility] services consulted 5. Disposition:   Prognosis is likely hrs to days  Patient Documents Completed or Given: Document Given Completed  Advanced Directives Pkt    MOST    DNR    Gone from My Sight    Hard Choices yes     Time In Time Out Total Time Spent with Patient Total Overall Time  1055 1130 35 min 35 min    Greater than 50%  of this time was spent counseling and coordinating care related to the above assessment and plan.  Lorinda Creed NP  Palliative Medicine Team Team Phone # [phone] Pager [phone]   1

## 2013-08-23 NOTE — Progress Notes (Signed)
Bladder was distended when palpated. Bladder scan shows . Foley Catheter inserted by S. Renae Gloss, Rn without difficulty.

## 2013-08-23 NOTE — Progress Notes (Signed)
Chaplain was given a consult to find a [employer] Bishop for pt.  Chaplain tried to contact local congregation but had no answer.  Chaplain then spoke with daughter of pt and she provided a point of contact from the local [employer] church.  Chaplain got in touch with the wife of the contact person, whom informed him that her husband was looking into finding the pt a Bishop.  She told the chaplain that they were looking into it and would keep the hospital abreast of the situation as it unfolds.  Chaplain will be available if needed or requested to help further in this matter.

## 2013-08-23 NOTE — Progress Notes (Signed)
Nutrition Brief Note  RD drawn to chart due to Low Braden Score.  Chart reviewed. Pt now transitioning to comfort care.  No further nutrition interventions warranted at this time.  Please re-consult as needed.   Loyce Dys, MS RD LDN Clinical Inpatient Dietitian Pager: [phone] Weekend/After hours pager: [phone]

## 2013-08-26 ENCOUNTER — Ambulatory Visit: Payer: Medicare HMO

## 2013-08-26 ENCOUNTER — Other Ambulatory Visit: Payer: Medicare HMO | Admitting: Lab

## 2013-08-28 NOTE — Discharge Summary (Signed)
Death Summary  Larry West ZOX:[id number] DOB: Aug 21, 1926 DOA: 2013/08/27  PCP: Leo Grosser, MD  Admit date: 08-27-2013 Date of Death: Aug 30, 2013  Final Diagnoses:  Principal Problem:   Acute respiratory failure with hypoxia Active Problems:   PERIPHERAL VASCULAR DISEASE   HTN (hypertension)   Chronic low back pain   ETOHism   Anemia of chronic disease   Hyperkalemia   Severe sepsis with acute organ dysfunction   Acute renal failure   Metabolic acidosis   Palliative care encounter   Dyspnea   History of present illness:  Patient was a 79 year old male with a history of hypertension, alcoholism, seizure disorder who was brought to the ED for worsening confusion and shortness of breath. A few days prior to admission the patient apparently was complaining of diffuse myalgias, and was having more issues with anxiety and panic attacks. Patient's son found him at home to be very confused and agitated, he was brought to the ED and found to be hypoxic with acute respiratory failure, he was also found to have acute renal failure with a creatinine of 9.62. Patient was then admitted to the hospital, after discussion in the emergency room with the family by the ED M.D., PCCM and the hospitalist, patient was then transitioned to comfort care status.  Hospital Course:  On admission, patient was initially given IV fluids and also started on a Dilaudid infusion for comfort. Patient currently deteriorated, he was then just maintained on comfort medications. Family was at bedside throughout the hospital stay. Palliative care was also consulted. Patient subsequently expired at 0400 hours on 08-31-2023.  See below for hospital course by problem list: Acute respiratory failure with hypoxia  - Suspect secondary to pneumonia, tachypnea secondary to severe metabolic acidosis.  - No aggressive care, DO NOT RESUSCITATE and full comfort care. Was on a Dilaudid infusion- titrated for comfort   Acute on  chronic renal failure  - Family was not keen on any aggressive care including dialysis.  - No further lab work done during brief hospitalization   Severe metabolic acidosis  - Secondary to severe renal failure  -.Initially was on  Bicarbonate infusion-but subsequently stopped when transitioned to comfort care status  Severe sepsis  - Secondary to pneumonia  - No antibiotics were started as comfort care   Chronic pain syndrome  - Long history of chronic pain.  - Transitioned to comfort care on Dilaudid drip.  History of hypertension  - Was off all antihypertensive medications-   History of dyslipidemia  - Was off statins-comfort care.  SignedJeoffrey Massed  [facility] 08/28/2013, 11:07 PM

## 2013-08-29 ENCOUNTER — Ambulatory Visit: Payer: Medicare HMO | Admitting: Hematology and Oncology

## 2013-08-29 ENCOUNTER — Ambulatory Visit: Payer: Medicare HMO

## 2013-08-29 ENCOUNTER — Other Ambulatory Visit: Payer: Medicare HMO

## 2013-08-29 ENCOUNTER — Other Ambulatory Visit: Payer: Self-pay | Admitting: *Deleted

## 2013-09-01 NOTE — Progress Notes (Signed)
Wasted Dilaudid 0.5mg /ml- 23ml with Elza Rafter, RN.

## 2013-09-01 NOTE — Progress Notes (Signed)
Spoke with Dr. Arthor Captain about signing death certificate. He said he was looking into who needs to sign and would get back in touch with me. I informed him that the body had be taken to the morgue.

## 2013-09-01 NOTE — Progress Notes (Signed)
Pt expired at 0400. Md notified. [facility] and family notified of death.

## 2013-09-01 DEATH — deceased

## 2013-09-03 NOTE — Consult Note (Signed)
I have reviewed and discussed the care of this patient in detail with the nurse practitioner including pertinent patient records, physical exam findings and data. I agree with details of this encounter.  

## 2013-09-30 ENCOUNTER — Other Ambulatory Visit: Payer: Self-pay | Admitting: Hematology and Oncology

## 2013-11-15 IMAGING — CR DG CHEST 2V
2 series · 2 of 2 positions shown · non-contrast
Comparison: Chest radiograph performed 09/04/2010

CLINICAL DATA: Cough, shortness of breath and vomiting.

CHEST - 2 VIEW

[w chest pa]
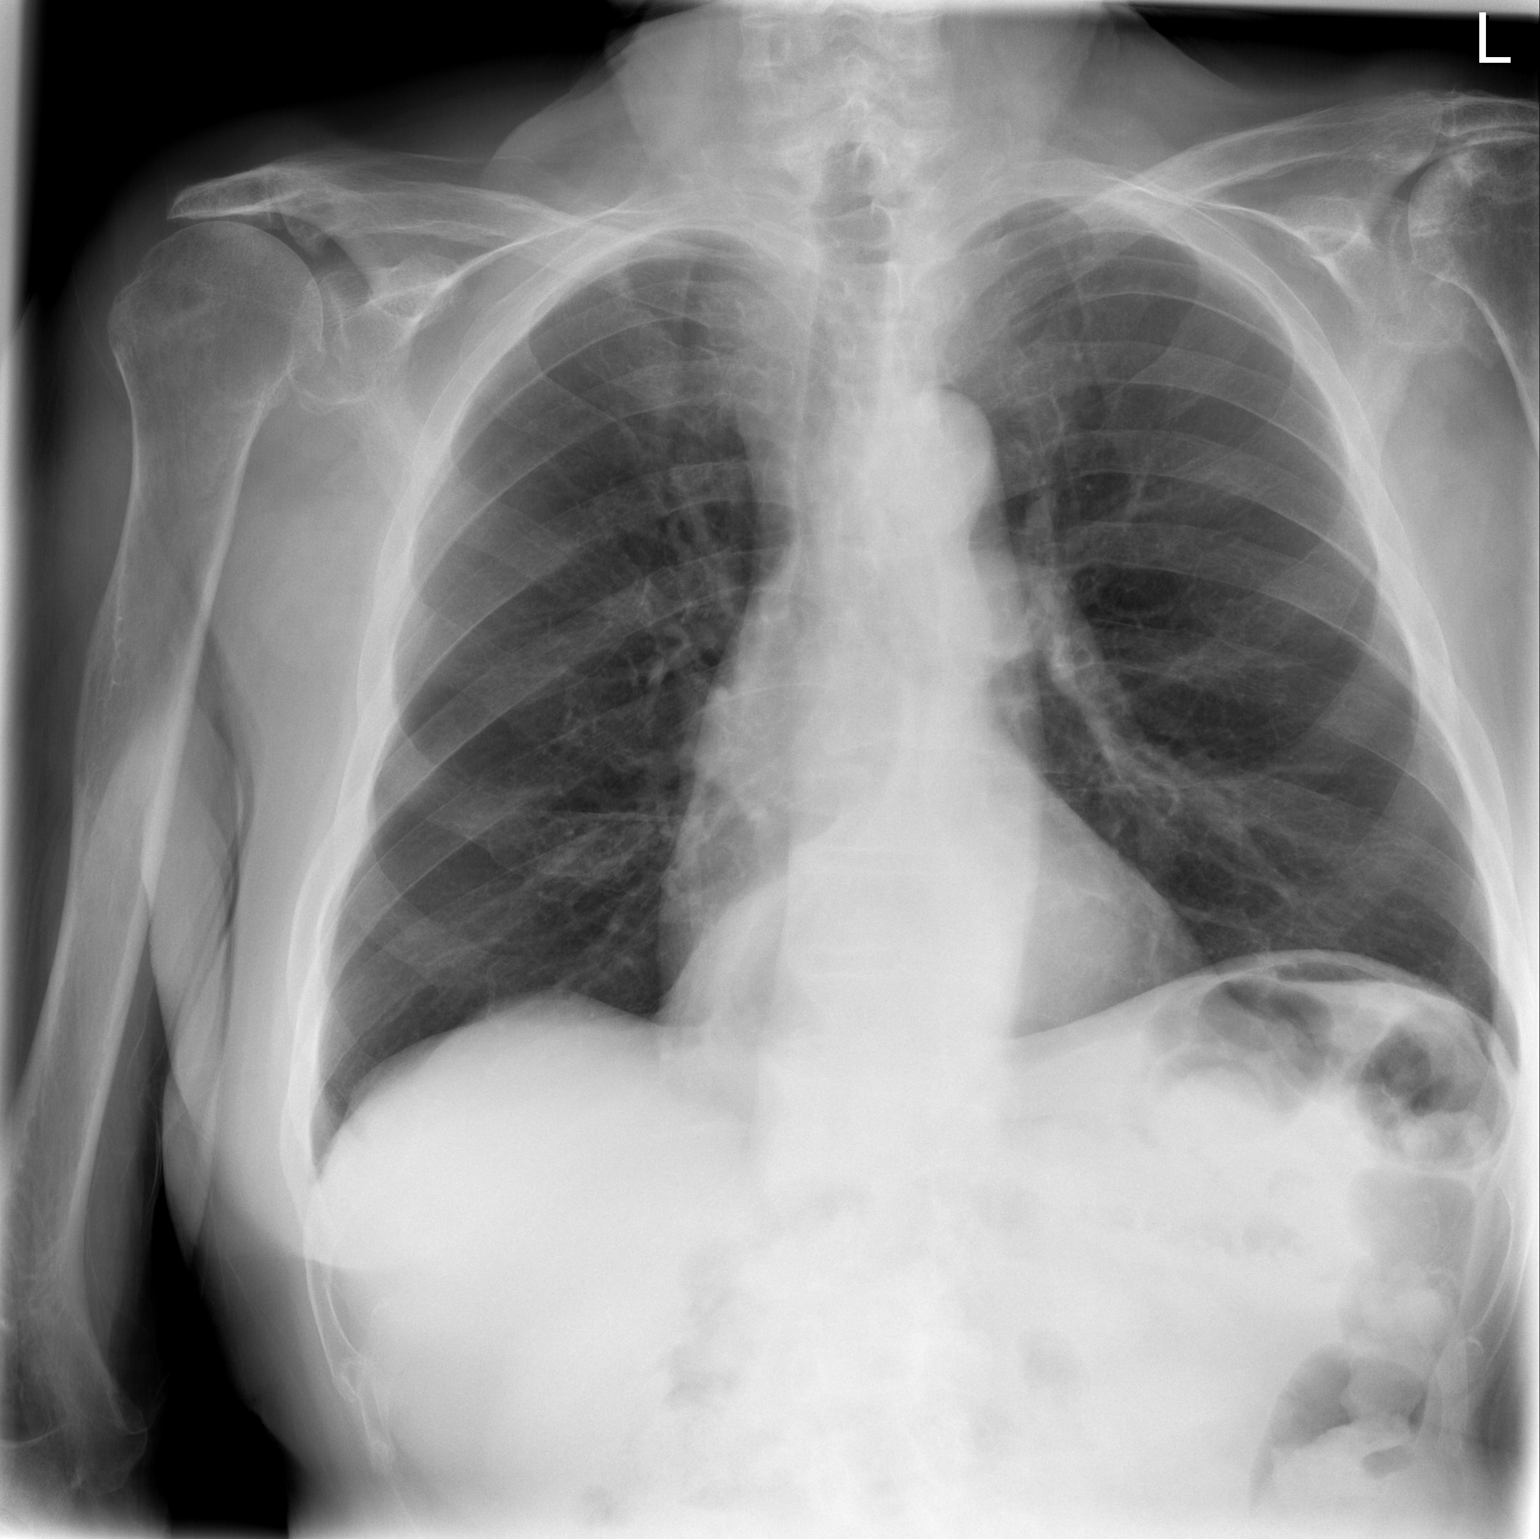

[w chest lat]
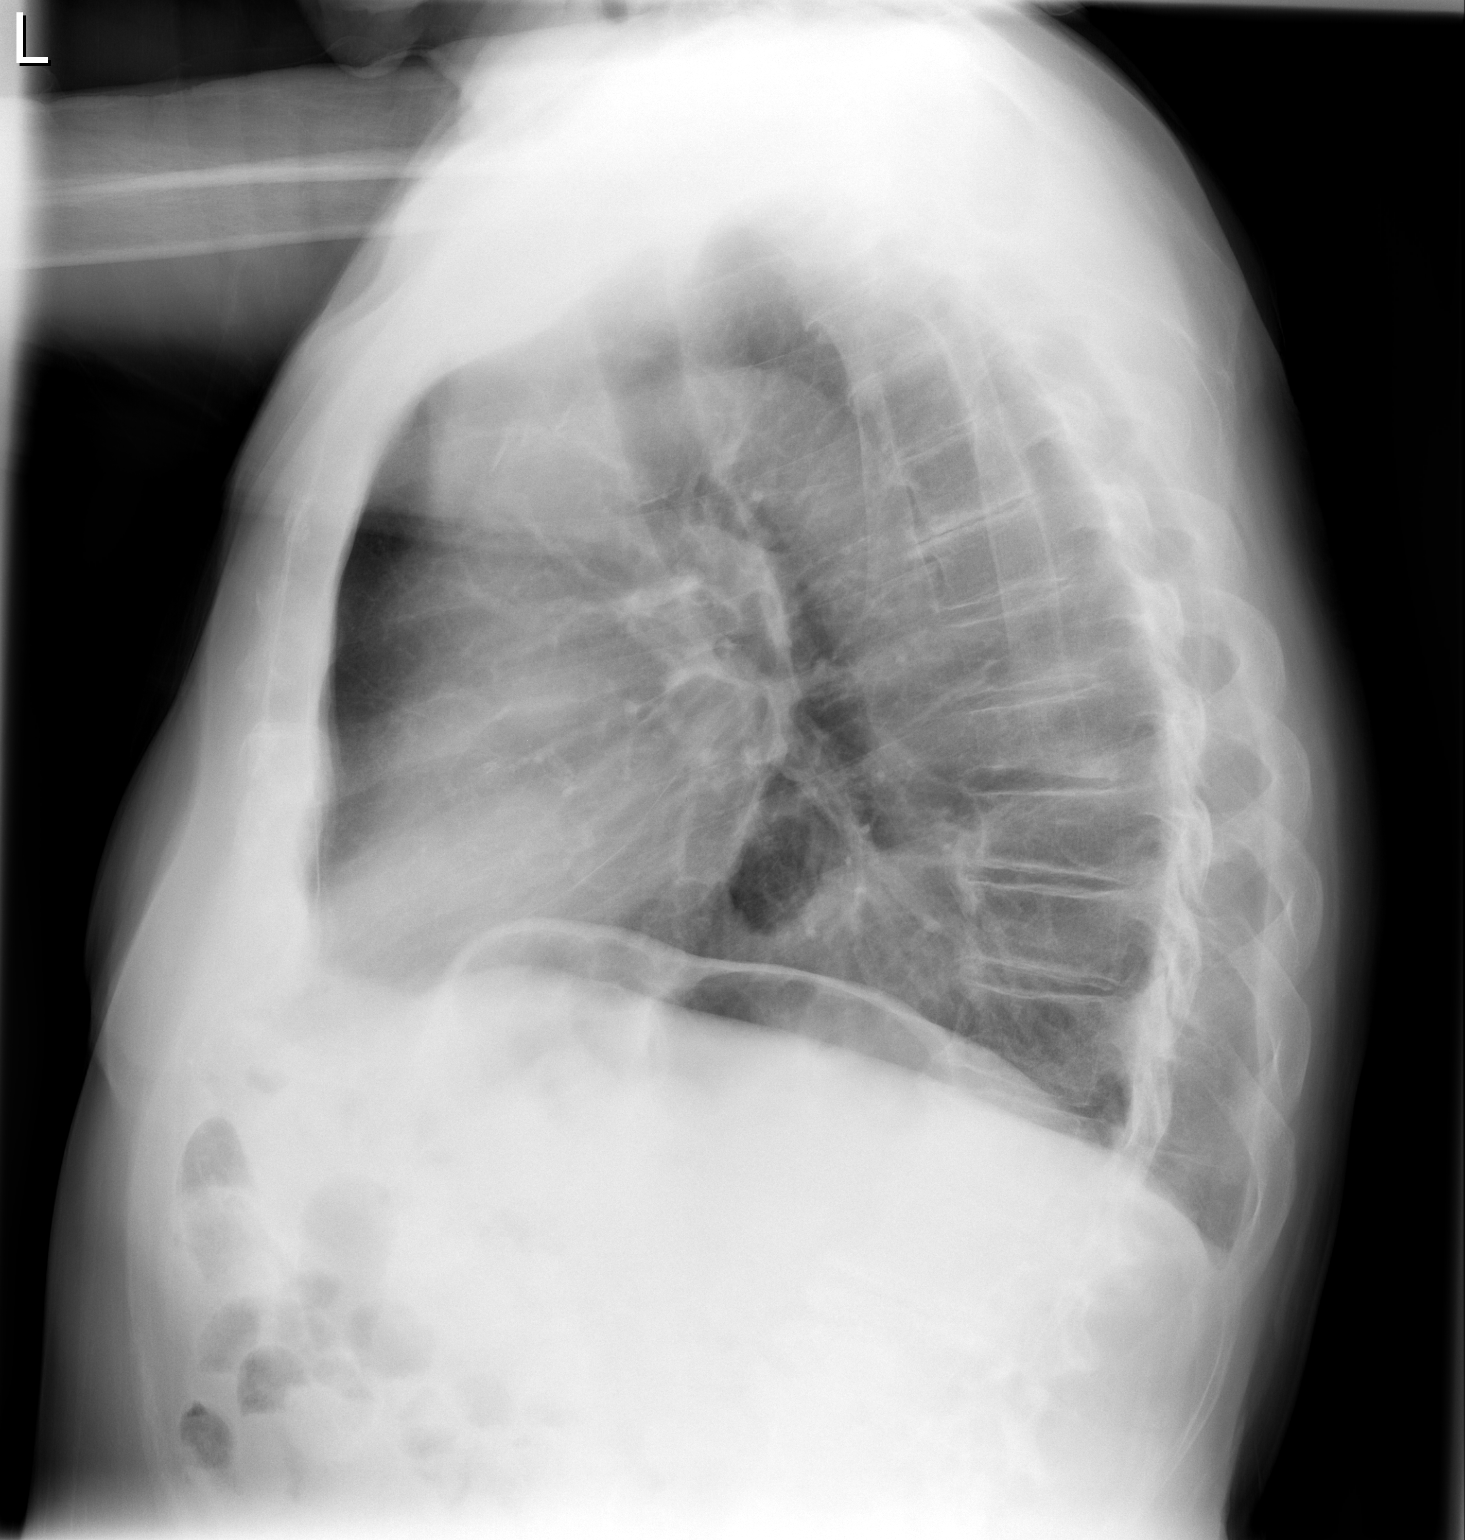

[2 of 2 positions shown; findings below may reference images not displayed]

FINDINGS: The lungs are well-aerated and clear.  There is no
evidence of focal opacification, pleural effusion or pneumothorax.

The heart is normal in size; the mediastinal contour is within
normal limits.  No acute osseous abnormalities are seen.  Mild
degenerative change is noted at the left humeral head; there is
superior subluxation of both humeral heads.  A moderate to large
hiatal hernia is again noted.
IMPRESSION: 1.  No acute cardiopulmonary process seen.
2.  Moderate to large hiatal hernia again noted.
3.  Mild degenerative change at the left humeral head.

## 2014-03-17 IMAGING — CR DG HAND COMPLETE 3+V*R*
3 series · 3 of 3 positions shown · non-contrast
Comparison: None.

CLINICAL DATA: Fall.  Hand injury and pain.

RIGHT HAND - COMPLETE 3+ VIEW

[view not recorded (1 of 3)]
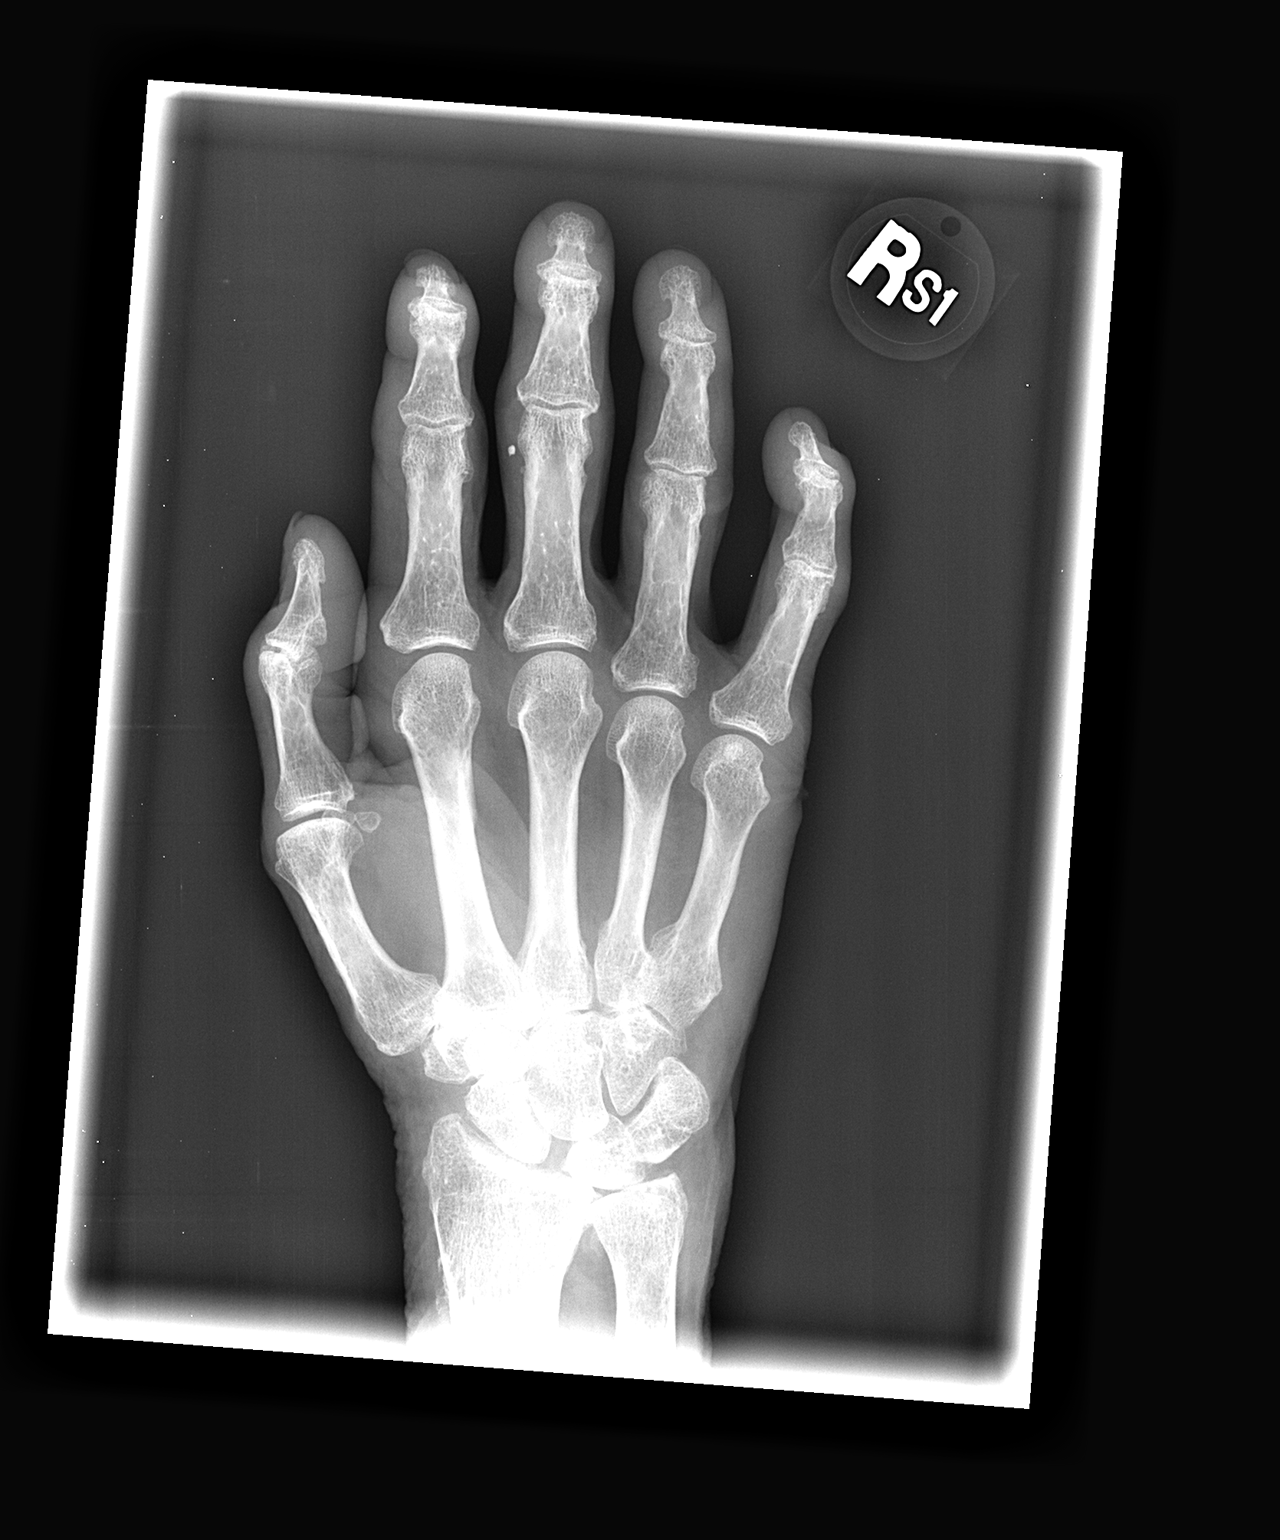

[view not recorded (2 of 3)]
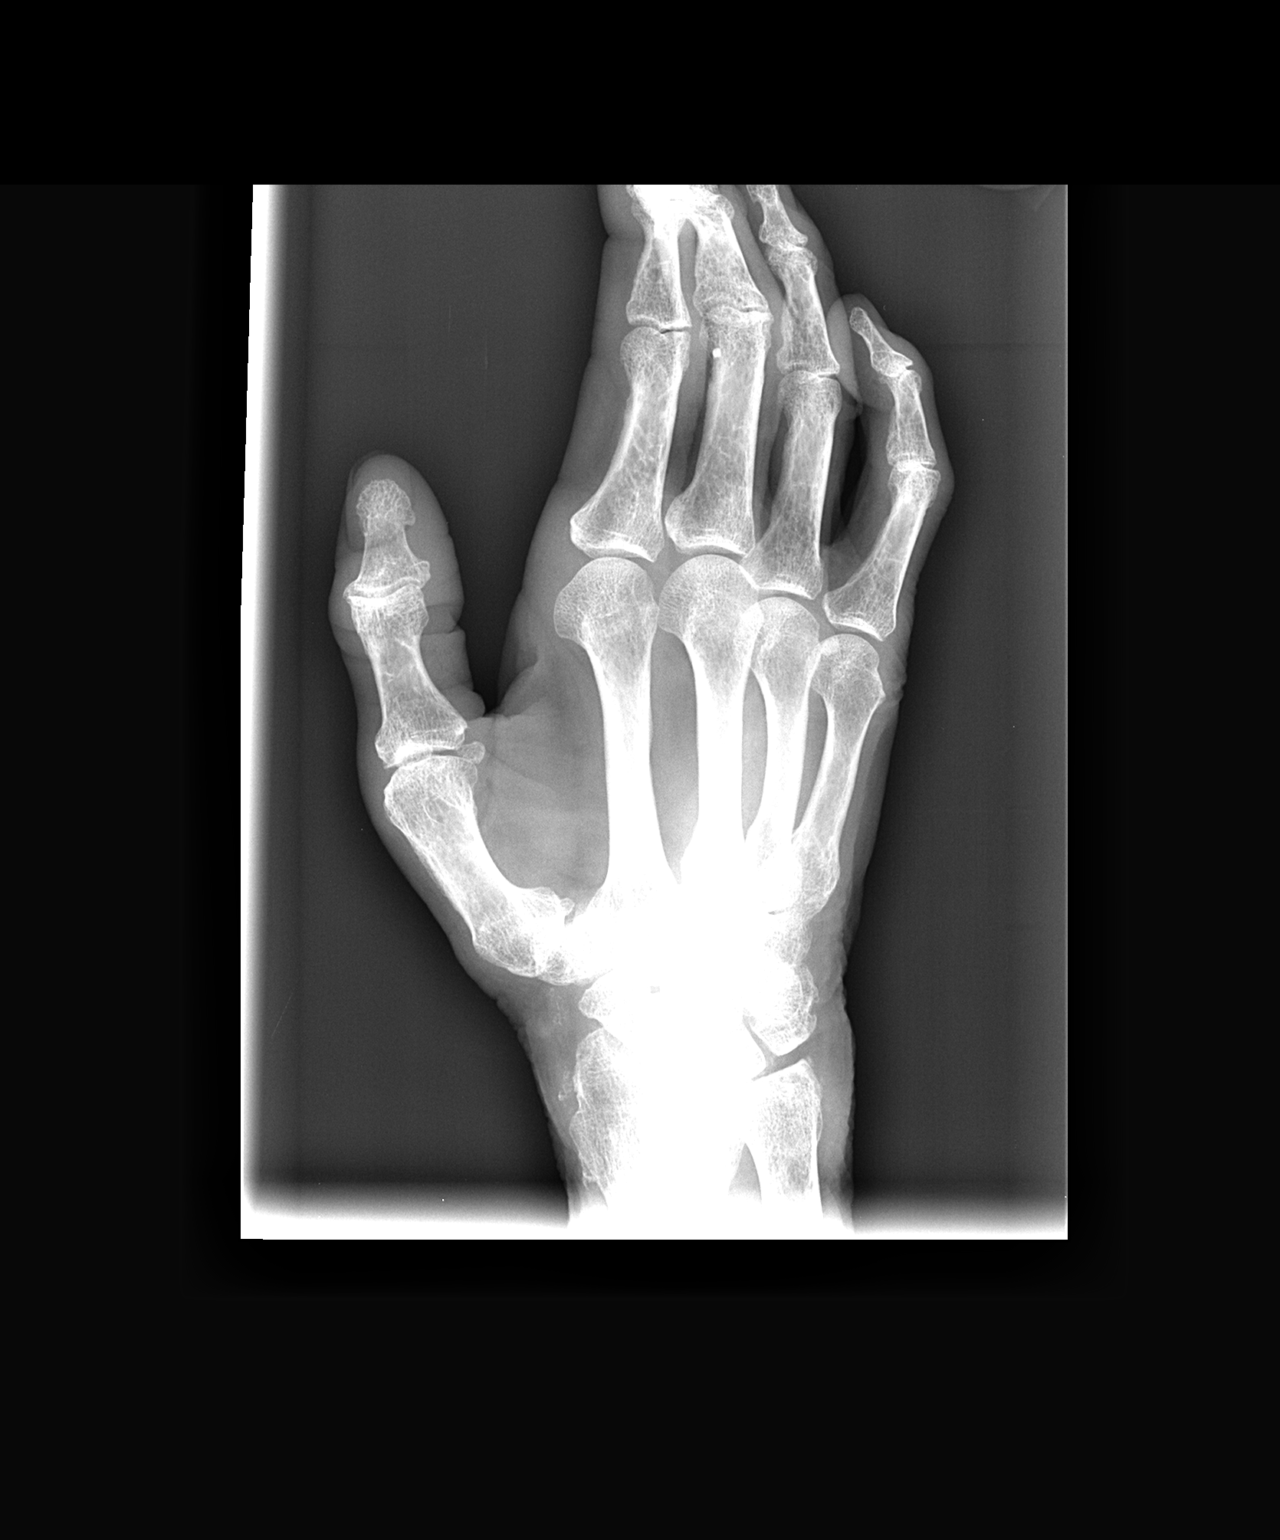

[view not recorded (3 of 3)]
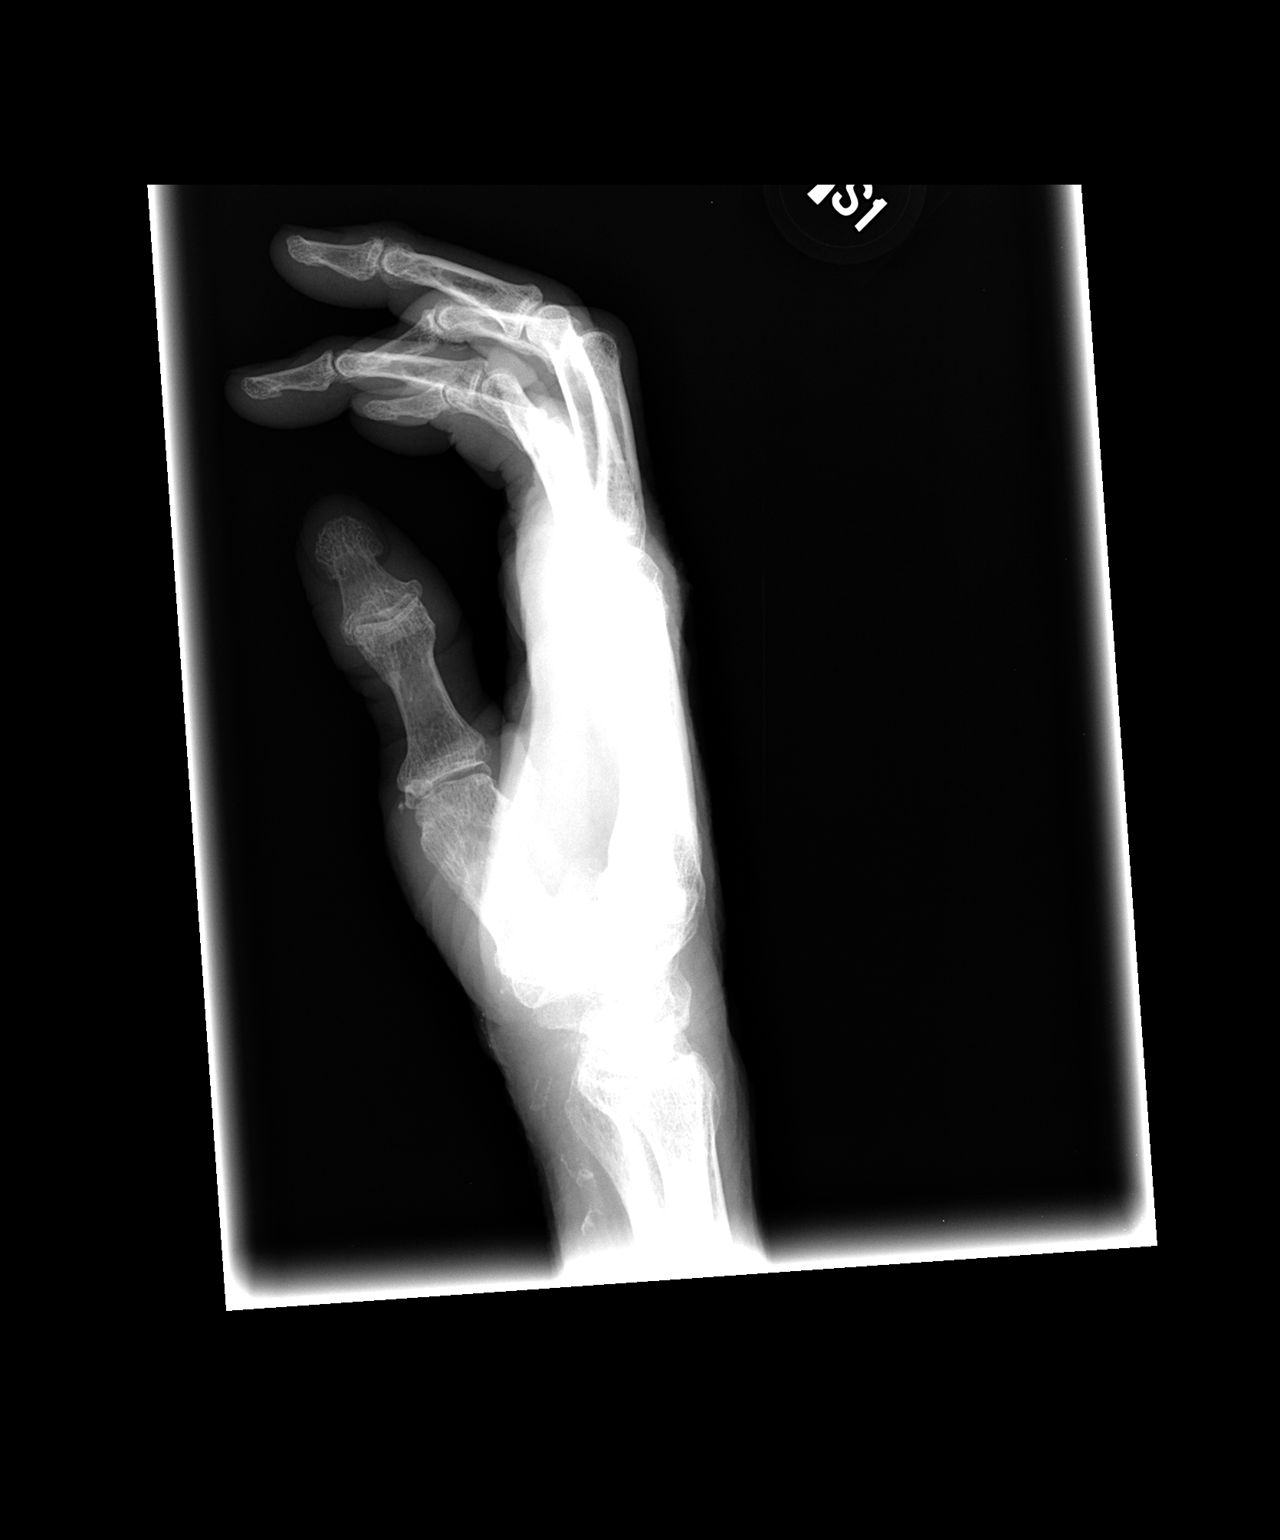

[3 of 3 positions shown; findings below may reference images not displayed]

FINDINGS: No evidence of acute fracture or dislocation.  Mild
osteoarthritis is seen involve the distal interphalangeal joints.
Generalized osteopenia noted.  The radiocarpal and distal radial
ulnar joint osteoarthritis also demonstrated.
IMPRESSION: 1.  No acute findings.
2.  Mild osteoarthritis involving the distal interphalangeal joints
and wrist.
